# Patient Record
Sex: Male | Born: 1940 | Race: White | Hispanic: No | Marital: Married | Smoking: Never smoker
Health system: Southern US, Community
[De-identification: ages and names within clinical notes are randomized; demographics above are authoritative.]

## PROBLEM LIST (undated history)

## (undated) DIAGNOSIS — C4491 Basal cell carcinoma of skin, unspecified: Secondary | ICD-10-CM

## (undated) DIAGNOSIS — K8020 Calculus of gallbladder without cholecystitis without obstruction: Secondary | ICD-10-CM

## (undated) DIAGNOSIS — E785 Hyperlipidemia, unspecified: Secondary | ICD-10-CM

## (undated) DIAGNOSIS — I10 Essential (primary) hypertension: Secondary | ICD-10-CM

## (undated) DIAGNOSIS — K219 Gastro-esophageal reflux disease without esophagitis: Secondary | ICD-10-CM

## (undated) DIAGNOSIS — N5201 Erectile dysfunction due to arterial insufficiency: Secondary | ICD-10-CM

## (undated) DIAGNOSIS — C61 Malignant neoplasm of prostate: Secondary | ICD-10-CM

## (undated) HISTORY — DX: Erectile dysfunction due to arterial insufficiency: N52.01

## (undated) HISTORY — DX: Basal cell carcinoma of skin, unspecified: C44.91

## (undated) HISTORY — DX: Malignant neoplasm of prostate: C61

## (undated) HISTORY — DX: Calculus of gallbladder without cholecystitis without obstruction: K80.20

## (undated) HISTORY — DX: Hyperlipidemia, unspecified: E78.5

## (undated) HISTORY — DX: Gastro-esophageal reflux disease without esophagitis: K21.9

## (undated) HISTORY — PX: POLYPECTOMY: SHX149

## (undated) HISTORY — DX: Essential (primary) hypertension: I10

---
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
## 2001-08-05 ENCOUNTER — Encounter: Payer: Self-pay | Admitting: Internal Medicine

## 2001-08-05 ENCOUNTER — Ambulatory Visit (HOSPITAL_COMMUNITY): Admission: RE | Admit: 2001-08-05 | Discharge: 2001-08-05 | Payer: Self-pay | Admitting: Internal Medicine

## 2001-11-04 ENCOUNTER — Encounter: Payer: Self-pay | Admitting: Internal Medicine

## 2001-11-04 ENCOUNTER — Ambulatory Visit (HOSPITAL_COMMUNITY): Admission: RE | Admit: 2001-11-04 | Discharge: 2001-11-04 | Payer: Self-pay | Admitting: Orthopedic Surgery

## 2004-09-12 ENCOUNTER — Ambulatory Visit: Payer: Self-pay | Admitting: Internal Medicine

## 2004-10-17 ENCOUNTER — Ambulatory Visit: Payer: Self-pay | Admitting: Internal Medicine

## 2004-11-01 ENCOUNTER — Ambulatory Visit: Payer: Self-pay | Admitting: Internal Medicine

## 2005-04-23 ENCOUNTER — Ambulatory Visit: Payer: Self-pay | Admitting: Endocrinology

## 2005-09-02 ENCOUNTER — Ambulatory Visit: Admission: RE | Admit: 2005-09-02 | Discharge: 2005-12-01 | Payer: Self-pay | Admitting: Radiation Oncology

## 2005-09-03 ENCOUNTER — Ambulatory Visit: Payer: Self-pay | Admitting: Internal Medicine

## 2005-12-02 ENCOUNTER — Ambulatory Visit: Admission: RE | Admit: 2005-12-02 | Discharge: 2006-02-10 | Payer: Self-pay | Admitting: Radiation Oncology

## 2006-04-20 ENCOUNTER — Ambulatory Visit: Payer: Self-pay | Admitting: Internal Medicine

## 2006-08-07 ENCOUNTER — Ambulatory Visit: Payer: Self-pay | Admitting: Internal Medicine

## 2007-05-07 ENCOUNTER — Ambulatory Visit: Payer: Self-pay | Admitting: Internal Medicine

## 2007-05-09 ENCOUNTER — Encounter: Payer: Self-pay | Admitting: Internal Medicine

## 2007-05-09 DIAGNOSIS — I10 Essential (primary) hypertension: Secondary | ICD-10-CM

## 2007-05-09 DIAGNOSIS — Z8546 Personal history of malignant neoplasm of prostate: Secondary | ICD-10-CM

## 2007-05-12 LAB — CONVERTED CEMR LAB
Creatinine, Ser: 1.3 mg/dL (ref 0.4–1.5)
Total CHOL/HDL Ratio: 6.4
Triglycerides: 118 mg/dL (ref 0–149)

## 2007-12-27 ENCOUNTER — Ambulatory Visit: Payer: Self-pay | Admitting: Gastroenterology

## 2008-01-10 ENCOUNTER — Encounter: Payer: Self-pay | Admitting: Internal Medicine

## 2008-01-10 ENCOUNTER — Encounter: Payer: Self-pay | Admitting: Gastroenterology

## 2008-01-10 ENCOUNTER — Ambulatory Visit: Payer: Self-pay | Admitting: Gastroenterology

## 2008-08-23 ENCOUNTER — Ambulatory Visit: Payer: Self-pay | Admitting: Internal Medicine

## 2008-10-12 ENCOUNTER — Ambulatory Visit: Payer: Self-pay | Admitting: Internal Medicine

## 2008-10-14 DIAGNOSIS — C4491 Basal cell carcinoma of skin, unspecified: Secondary | ICD-10-CM

## 2008-10-14 DIAGNOSIS — E785 Hyperlipidemia, unspecified: Secondary | ICD-10-CM

## 2008-10-14 DIAGNOSIS — Z9189 Other specified personal risk factors, not elsewhere classified: Secondary | ICD-10-CM

## 2008-10-26 ENCOUNTER — Ambulatory Visit: Payer: Self-pay | Admitting: Internal Medicine

## 2008-10-26 LAB — CONVERTED CEMR LAB
AST: 28 units/L (ref 0–37)
Calcium: 8.9 mg/dL (ref 8.4–10.5)
Cholesterol: 146 mg/dL (ref 0–200)
Creatinine, Ser: 1.3 mg/dL (ref 0.4–1.5)
GFR calc non Af Amer: 58 mL/min
HDL: 41.9 mg/dL (ref >39.0)
Potassium: 3.8 meq/L (ref 3.5–5.1)
Total CHOL/HDL Ratio: 3.5
Total Protein: 6.4 g/dL (ref 6.0–8.3)
VLDL: 12 mg/dL (ref 0–40)

## 2008-10-29 ENCOUNTER — Encounter: Payer: Self-pay | Admitting: Internal Medicine

## 2008-11-06 ENCOUNTER — Telehealth: Payer: Self-pay | Admitting: Internal Medicine

## 2009-01-02 ENCOUNTER — Telehealth (INDEPENDENT_AMBULATORY_CARE_PROVIDER_SITE_OTHER): Payer: Self-pay | Admitting: *Deleted

## 2009-01-08 ENCOUNTER — Ambulatory Visit: Payer: Self-pay | Admitting: Internal Medicine

## 2009-01-08 LAB — CONVERTED CEMR LAB
BUN: 18 mg/dL (ref 6–23)
Chloride: 105 meq/L (ref 96–112)
Ketones, ur: NEGATIVE mg/dL
Nitrite: NEGATIVE
Sodium: 142 meq/L (ref 135–145)
Total Protein, Urine: NEGATIVE mg/dL
Urine Glucose: NEGATIVE mg/dL
Urobilinogen, UA: 0.2 (ref 0.0–1.0)
pH: 6 (ref 5.0–8.0)

## 2009-01-15 ENCOUNTER — Encounter: Payer: Self-pay | Admitting: Internal Medicine

## 2009-01-19 ENCOUNTER — Telehealth: Payer: Self-pay | Admitting: Internal Medicine

## 2009-11-04 ENCOUNTER — Encounter: Payer: Self-pay | Admitting: Internal Medicine

## 2009-11-05 ENCOUNTER — Telehealth (INDEPENDENT_AMBULATORY_CARE_PROVIDER_SITE_OTHER): Payer: Self-pay | Admitting: *Deleted

## 2009-11-06 ENCOUNTER — Ambulatory Visit: Payer: Self-pay | Admitting: Internal Medicine

## 2009-11-08 ENCOUNTER — Telehealth: Payer: Self-pay | Admitting: Internal Medicine

## 2009-11-09 ENCOUNTER — Observation Stay (HOSPITAL_COMMUNITY): Admission: EM | Admit: 2009-11-09 | Discharge: 2009-11-10 | Payer: Self-pay | Admitting: Emergency Medicine

## 2009-11-09 ENCOUNTER — Ambulatory Visit: Payer: Self-pay | Admitting: Internal Medicine

## 2009-11-10 ENCOUNTER — Encounter (INDEPENDENT_AMBULATORY_CARE_PROVIDER_SITE_OTHER): Payer: Self-pay | Admitting: *Deleted

## 2009-11-13 ENCOUNTER — Telehealth (INDEPENDENT_AMBULATORY_CARE_PROVIDER_SITE_OTHER): Payer: Self-pay | Admitting: *Deleted

## 2009-11-14 ENCOUNTER — Ambulatory Visit: Payer: Self-pay

## 2009-11-14 ENCOUNTER — Ambulatory Visit: Payer: Self-pay | Admitting: Cardiovascular Disease

## 2009-11-14 ENCOUNTER — Encounter (HOSPITAL_COMMUNITY): Admission: RE | Admit: 2009-11-14 | Discharge: 2010-01-09 | Payer: Self-pay | Admitting: Cardiology

## 2009-11-14 ENCOUNTER — Ambulatory Visit: Payer: Self-pay | Admitting: Cardiology

## 2009-11-14 DIAGNOSIS — K8020 Calculus of gallbladder without cholecystitis without obstruction: Secondary | ICD-10-CM | POA: Insufficient documentation

## 2009-11-19 ENCOUNTER — Encounter (INDEPENDENT_AMBULATORY_CARE_PROVIDER_SITE_OTHER): Payer: Self-pay | Admitting: *Deleted

## 2009-11-27 ENCOUNTER — Ambulatory Visit (HOSPITAL_COMMUNITY): Admission: RE | Admit: 2009-11-27 | Discharge: 2009-11-27 | Payer: Self-pay | Admitting: Internal Medicine

## 2009-11-27 ENCOUNTER — Telehealth: Payer: Self-pay | Admitting: Internal Medicine

## 2009-11-27 ENCOUNTER — Encounter: Payer: Self-pay | Admitting: Internal Medicine

## 2009-11-28 ENCOUNTER — Ambulatory Visit: Payer: Self-pay | Admitting: Internal Medicine

## 2009-11-28 DIAGNOSIS — M722 Plantar fascial fibromatosis: Secondary | ICD-10-CM | POA: Insufficient documentation

## 2009-11-28 DIAGNOSIS — M79609 Pain in unspecified limb: Secondary | ICD-10-CM | POA: Insufficient documentation

## 2009-11-30 ENCOUNTER — Ambulatory Visit: Payer: Self-pay | Admitting: Gastroenterology

## 2009-11-30 ENCOUNTER — Encounter (INDEPENDENT_AMBULATORY_CARE_PROVIDER_SITE_OTHER): Payer: Self-pay | Admitting: *Deleted

## 2009-11-30 DIAGNOSIS — Z860100 Personal history of colon polyps, unspecified: Secondary | ICD-10-CM | POA: Insufficient documentation

## 2009-11-30 DIAGNOSIS — R1033 Periumbilical pain: Secondary | ICD-10-CM | POA: Insufficient documentation

## 2009-11-30 DIAGNOSIS — K5730 Diverticulosis of large intestine without perforation or abscess without bleeding: Secondary | ICD-10-CM | POA: Insufficient documentation

## 2009-11-30 LAB — CONVERTED CEMR LAB
ALT: 19 units/L (ref 0–53)
AST: 19 units/L (ref 0–37)
Alkaline Phosphatase: 65 units/L (ref 39–117)
Bilirubin, Direct: 0.2 mg/dL (ref 0.0–0.3)
Total Protein: 6.4 g/dL (ref 6.0–8.3)

## 2009-12-03 ENCOUNTER — Ambulatory Visit: Payer: Self-pay | Admitting: Gastroenterology

## 2009-12-13 ENCOUNTER — Telehealth: Payer: Self-pay | Admitting: Gastroenterology

## 2010-07-01 ENCOUNTER — Ambulatory Visit: Payer: Self-pay | Admitting: Internal Medicine

## 2010-08-19 ENCOUNTER — Telehealth: Payer: Self-pay | Admitting: Internal Medicine

## 2010-11-05 NOTE — Progress Notes (Signed)
  Phone Note Call from Patient Call back at [facility] Phone [phone]   Caller: Patient Call For: Dr Debby Bud Summary of Call: Pt is requesting referral to a GI M.D. Pt wants referral and appt today. I told I would pass the info along but it normally takes a little bit of time to get in w/a GI M.D. Pt saw Dr Debby Bud earlier this week for abd pain. Pt is still having the pain and wants a referral immediately. Please advise. Initial call taken by: Verdell Face,  November 08, 2009 9:36 AM  Follow-up for Phone Call        OK for referral - will notify PCCs.   While waiting for appointment if he is having on-going acute pain of a moderate to severe nature we can order a CT abdomen and pelvis to rule out mass lesions, abscess, GB disease, etc. Follow-up by: Jacques Navy MD,  November 08, 2009 12:58 PM  Additional Follow-up for Phone Call Additional follow up Details #1::        informed pt, he did not seem like he was in a hurry to see GI. I did advise him if he expirianced any ongoing acute pain of moderate to severe nature to call the office and MD could order ct to rule out mass lesions, abscess, GB disease etc. Pt stated he would call if needed Additional Follow-up by: Ami Bullins CMA,  November 08, 2009 1:31 PM

## 2010-11-05 NOTE — Assessment & Plan Note (Signed)
Summary: per phone note in EMR/low abd pain/cd   Vital Signs:  Patient profile:   70 year old male Height:      72 inches Weight:      196 pounds BMI:     26.68 O2 Sat:      97 % on Room air Temp:     97.7 degrees F oral Pulse rate:   75 / minute BP sitting:   188 / 90  (left arm) Cuff size:   regular  Vitals Entered By: Bill Salinas CMA (November 06, 2009 1:58 PM)  O2 Flow:  Room air CC: pt c/o lower abd discomfort. He states sunday it was more pain than discomfort but subsided. Pt has had normal bowel movements but did mention one occurance of bright red blood in his stool about 2 weeks ago. This came after lifting an 80 pound tv/ ab   Primary Care Provider:  Jacques Navy MD  CC:  pt c/o lower abd discomfort. He states sunday it was more pain than discomfort but subsided. Pt has had normal bowel movements but did mention one occurance of bright red blood in his stool about 2 weeks ago. This came after lifting an 80 pound tv/ ab.  History of Present Illness: Abdominal discomfort starting about 2 weeks ago, after doing some heavy lifting. Saturday, Jan 29th, he had increased located below the umbilicus diffuse from side- to side, no protruberance, no tenderness to self-palpation, no fever or chills, no N/V. Had tried taking an aspirin - no noticeable help. Sunday and thereafter he felt better.   Current Medications (verified): 1)  Ranitidine Hcl 150 Mg  Caps (Ranitidine Hcl) .... Take 1 Tab By Mouth At Bedtime As Needed 2)  Lisinopril 20 Mg Tabs (Lisinopril) .... 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  Physical Exam  General:  Well-developed,well-nourished,in no acute distress; alert,appropriate and cooperative throughout examination Abdomen:  soft, non-tender, normal bowel sounds, no distention, no masses, no guarding, no rigidity, no abdominal hernia, and no hepatomegaly.     Impression & Recommendations:  Problem # 1:  ABDOMINAL PAIN, ACUTE (ICD-789.00) Patient with several weeks of low grade discomfort across the upper abdomen with an acute exacerbation Saturday, Jan 29th. His symptoms have resolved. His exam is normal. Suspect possible viral gastroenteritis. No indication for further study.  Complete Medication List: 1)  Ranitidine Hcl 150 Mg Caps (Ranitidine hcl) .... Take 1 tab by mouth at bedtime as needed 2)  Lisinopril 20 Mg Tabs (Lisinopril) .... Take 1 tablet by mouth once a day 3)  Hydrochlorothiazide 25 Mg Tabs (Hydrochlorothiazide) .Marland Kitchen.. 1 by mouth once daily Prescriptions: LISINOPRIL 20 MG TABS (LISINOPRIL) Take 1 tablet by mouth once a day  #30 x 12   Entered and Authorized by:   Jacques Navy MD   Signed by:   Jacques Navy MD on 11/06/2009   Method used:   Electronically to        [facility]  [place] (retail)       [address]       [place], [place]  [address]       Ph: [phone]       Fax: [phone]   RxID:   [id number] HYDROCHLOROTHIAZIDE 25 MG TABS (HYDROCHLOROTHIAZIDE) 1 by mouth once daily  #30 x 12   Entered and Authorized by:   Jacques Navy MD   Signed by:   Jacques Navy MD on 11/06/2009   Method used:   [facility] to        [facility] [phone][facility] (retail)       [address]       [place], [place]  [address]       Ph: [phone]        Fax: [phone]   RxID:   [id number]

## 2010-11-05 NOTE — Miscellaneous (Signed)
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

## 2010-11-05 NOTE — Progress Notes (Signed)
Summary: Nuclear Pre-Procedure  Phone Note Call from Patient   Details for Reason: Patient called for instructions for myoview stress test Summary of Call: Reviewed information on Myoview Information Sheet (see scanned document for further details).  Spoke with patient.     Nuclear Med Background Indications for Stress Test: Evaluation for Ischemia, Post Hospital  Indications Comments: Discharged 11/09/09 CP R/O MI (-) enzymes  History: GXT  History Comments: '04 GXT (-) ischemia  Symptoms: Chest Pain    Nuclear Pre-Procedure Cardiac Risk Factors: Hypertension, Lipids, Smoker Height (in): 72  Nuclear Med Study Referring MD:  B.Crenshaw

## 2010-11-05 NOTE — Progress Notes (Signed)
Summary: CALL A NURSE  Phone Note From Other Clinic   Summary of Call: CALL A NURSE: 11/04/09 10:58 am Pt c/o low abd pain which kept pt up last night. Noted blood in stool a few wks ago. No fever n/v, or appetite loss. No urinary symptoms. Pt was advised to f/u with PCP the next day. Care advice given.  Initial call taken by: Lamar Sprinkles, CMA,  November 05, 2009 6:10 PM  Follow-up for Phone Call        looks like an add-on to the schedule Follow-up by: Jacques Navy MD,  November 06, 2009 9:07 AM  Additional Follow-up for Phone Call Additional follow up Details #1::        Pt coming 2/1 @ 2pm for appt w/Dr Norins. Additional Follow-up by: Verdell Face,  November 06, 2009 9:22 AM

## 2010-11-05 NOTE — Progress Notes (Signed)
  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 Vanda from [facility], needs an order ct orbit with out contrast. Fax number is [phone]. Please Advise Initial call taken by: Ami Bullins CMA,  November 27, 2009 10:38 AM  Follow-up for Phone Call        written order faxed to [phone] Follow-up by: Ami Bullins CMA,  November 27, 2009 11:00 AM

## 2010-11-05 NOTE — Progress Notes (Signed)
Summary: Call Report  Phone Note Other Incoming   Caller: [facility] Summary of Call: [facility] Triage Call Report Triage Record Num: [id number] Operator: Peri Jefferson Patient Name: Brent Coleman Call Date & Time: 08/17/2010 3:01:00PM Patient Phone: [phone] PCP: Illene Regulus Patient Gender: Male PCP Fax : [phone] Patient DOB: 19-Mar-1941 Practice Name: [facility] Reason for Call: Maurine Minister calling because he started coughing on 08/10/10. States that it has worsened. Feeling tired. Temp 99.3 O. Cough is nonproductive. Denies difficulty breathing. Advised UC within 24 hrs. Parameters reviewed concerning when to call back. Protocol(s) Used: Cough - Adult Recommended Outcome per Protocol: See Provider within 24 hours Reason for Outcome: New onset or worsening cough AND temperature up to 101.4F (38.6C) AND not responding to 12 hours of home care Care Advice: Call EMS 911 if develop new onset or increasing confusion or lethargy; breathing problems continue to worsen or skin becomes bluish/gray; develops chest pain.  ~  ~ SYMPTOM / CONDITION MANAGEMENT Analgesic/Antipyretic Advice - Acetaminophen: Consider acetaminophen as directed on label or by pharmacist/provider for pain or fever PRECAUTIONS: - Use if there is no history of liver disease, alcoholism, or intake of three or more alcohol drinks per day - Only if approved by provider during pregnancy or when breastfeeding - During pregnancy, acetaminophen should not be taken more than 3 consecutive days without telling provider - Do not exceed recommended dose or frequency  ~ Analgesic/Antipyretic Advice - NSAIDs: Consider aspirin, ibuprofen, naproxen or ketoprofen for pain or fever as directed on label or by pharmacist/provider. PRECAUTIONS: - If over 58 years of age, should not take longer than 1 week without consulting provider. EXCEPTIONS: - Should not be used if taking blood thinners or have bleeding problems. -  Do not use if have history of sensitivity/allergy to any of these medications; or history of cardiovascular, ulcer, kidney, liver disease or diabet Initial call taken by: Margaret Pyle, CMA,  August 19, 2010 8:10 AM

## 2010-11-05 NOTE — Assessment & Plan Note (Signed)
Summary: flu shot//jrc  Flu Vaccine Consent Questions     Do you have a history of severe allergic reactions to this vaccine? no    Any prior history of allergic reactions to egg and/or gelatin? no    Do you have a sensitivity to the preservative Thimersol? no    Do you have a past history of Guillan-Barre Syndrome? no    Do you currently have an acute febrile illness? no    Have you ever had a severe reaction to latex? no    Vaccine information given and explained to patient? yes    Are you currently pregnant? no    Lot Number:[id number]   Exp Date:04/05/2011   Site Given  Left Deltoid IM Carron Curie CMA  July 01, 2010 10:11 AM  Nurse Visit   Allergies: 1)  Cardura  Orders Added: 1)  Flu Vaccine 38yrs + MEDICARE PATIENTS [Q2039] 2)  Administration Flu vaccine - MCR [G0008]

## 2010-11-05 NOTE — Procedures (Signed)
Summary: COlon   Colonoscopy  Procedure date:  01/10/2008  Findings:      Location:  [facility].   Patient Name: Brent Coleman, Brent Coleman MRN:  Procedure Procedures: Colonoscopy CPT: 81191.  Personnel: Endoscopist: Vania Rea. Jarold Motto, MD.  Exam Location: Exam performed in Outpatient Clinic. Outpatient  Patient Consent: Procedure, Alternatives, Risks and Benefits discussed, consent obtained, from patient. Consent was obtained by the RN.  Indications  Surveillance of: Adenomatous Polyp(s).  Increased Risk Screening: For family history of colorectal neoplasia, in  parent age at onset: 28.  Comments: Both parents with hx. of COLON CA. History  Current Medications: Patient is not currently taking Coumadin.  Medical/ Surgical History: Prostate CA, Hypertension,  Pre-Exam Physical: Performed Jan 10, 2008. Cardio-pulmonary exam, Rectal exam, Abdominal exam, Extremity exam, Mental status exam WNL.  Comments: Pt. history reviewed/updated, physical exam performed prior to initiation of sedation? yes Exam Exam: Extent of exam reached: Cecum, extent intended: Cecum.  The cecum was identified by appendiceal orifice and IC valve. Patient position: on left side. Time to Cecum: 00:03:40. Time for Withdrawl: 00:08:10. Colon retroflexion performed. Images taken. ASA Classification: II. Tolerance: excellent.  Monitoring: Pulse and BP monitoring, Oximetry used. Supplemental O2 given. at 2 Liters.  Colon Prep Used Golytely for colon prep. Prep results: excellent.  Sedation Meds: Patient assessed and found to be appropriate for moderate (conscious) sedation. Sedation was managed by the Endoscopist. Fentanyl 100 mcg. given IV. Versed 12 given IV.  Instrument(s): CF 140L. Serial [id number].  Findings - NORMAL EXAM: Cecum to Sigmoid Colon. Not Seen: Polyps. AVM's. Colitis. Tumors. Crohn's. Diverticulosis.  - MULTIPLE POLYPS: Sigmoid Colon to Rectum. minimum size 2  mm, maximum size 3 mm. Procedure:  hot biopsy, 2 polyps Polyps sent to pathology. ICD9: Colon Polyps: 211.3.   Assessment  Diagnoses: 211.3: Colon Polyps. R/O adenomas.   Comments: He is at high risk with hx.of colon CA in BOTH PARENTS !!!!!!hIS HX. ALSO ++ FOR PROSTATE CANCER.... Events  Unplanned Interventions: No intervention was required.  Plans Medication Plan: Await pathology. Referring provider to order medications.  Patient Education: Patient given standard instructions for: Polyps. Patient instructed to get routine colonoscopy every 3 years.  Disposition: After procedure patient sent to recovery. After recovery patient sent home.  Scheduling/Referral: Follow-Up prn. Await pathology to schedule patient.    Jefferey Pica, MD   This report was created from the original endoscopy report, which was reviewed and signed by the above listed endoscopist.

## 2010-11-05 NOTE — Assessment & Plan Note (Signed)
Summary: INJURY TO FOOT /NWS   Vital Signs:  Patient profile:   70 year old male Height:      72 inches (182.88 cm) Weight:      189.12 pounds (85.96 kg) O2 Sat:      96 % on Room air Temp:     97.7 degrees F (36.50 degrees C) oral Pulse rate:   71 / minute BP sitting:   124 / 62  (left arm) Cuff size:   large  Vitals Entered By: Orlan Leavens (November 28, 2009 10:59 AM)  O2 Flow:  Room air CC: Injury to (R) foot Is Patient Diabetic? No   Primary Care Provider:  Jacques Navy MD  CC:  Injury to (R) foot.  History of Present Illness: here today with complaint of pain in right foot. onset of symptoms was 5 days ago. course has been gradual onset and now occurs in intermittent waxing/waning pattern. problem precipitated by right foot "slipping" on bike pedal as began pedaling from a stopped position - symptom characterized as pain in the arch area symptom radiates to nowhere -  problem associated with difficulty walking, esp in AM  but not associated with bruising or erythema . symptoms improved by taking weight off foot (sitting) - also helped with ASA 81 x1 this AM. (this AM pain 10/10 preventing pt from walking, now 2/10 and ok to walk) symptoms worsened with activity, esp this AM. no prior hx of same symptoms.   Current Medications (verified): 1)  Ranitidine Hcl 150 Mg  Caps (Ranitidine Hcl) .... Take 1 Tab By Mouth At Bedtime As Needed 2)  Lisinopril 20 Mg Tabs (Lisinopril) .... Take 1 Tablet By Mouth Once A Day 3)  Hydrochlorothiazide 25 Mg Tabs (Hydrochlorothiazide) .Marland Kitchen.. 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 Children: two daughters  one in [place].  Two grandchildren living in [place]. Occupation: Patient is retired and enjoying his retirement.  He remains active and continues to bicycle  Review of Systems  The patient denies fever, syncope, muscle weakness, and suspicious skin lesions.    Physical Exam  General:  alert, well-developed, well-nourished, and cooperative to examination.    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 1 by mouth once daily as needed for pain  Orders: T-Foot Right (73630TC) Prescription Created Electronically [phone])  Discussed use of gel inserts, ice massage, and stretching exercises.  pt education including handout from [facility] provided to pt  Complete Medication List: 1)  Ranitidine Hcl 150 Mg Caps (Ranitidine hcl) .Marland KitchenMarland KitchenMarland Kitchen  Take 1 tab by mouth at bedtime as needed 2)  Lisinopril 20 Mg Tabs (Lisinopril) .... Take 1 tablet by mouth once a day 3)  Hydrochlorothiazide 25 Mg Tabs (Hydrochlorothiazide) .Marland Kitchen.. 1 by mouth once daily 4)  Mobic 7.5 Mg Tabs (Meloxicam) .Marland Kitchen.. 1 by mouth once daily as needed for pain  Patient Instructions: 1)  it was good to see you today.  2)  your symptoms are most consistent with plantar fascitis as discussed - education and information on strecthes provided -  3)  mobic for antiinflammatory pain relief to  use as needed  -your prescription has been electronically submitted to your pharmacy. Please take as directed. Contact our office if you believe you're having problems with the medication(s).  4)  use shoe insert to help supprot arch until pain improved - 5)  may also do "ice massage" to the painful area 2-4x/day, espicially if any swelling occurs 6)  xray of foot today to exclude any stress fractures - will call you with results later today 7)  Please keep follow-up appointment as scheduled with dr. Debby Bud, sooner if problems.  Prescriptions: MOBIC 7.5 MG TABS (MELOXICAM) 1 by mouth once daily as needed for pain  #30 x 1   Entered and Authorized by:   Newt Lukes MD   Signed by:   Newt Lukes MD on 11/28/2009   Method used:   [facility]ronically to        [facility] [phone][facility] (retail)       [address]       [place], [place]  [address]       Ph: [phone]       Fax: [phone]   RxID:   [id number]

## 2010-11-05 NOTE — Letter (Signed)
Summary: [facility]: Immunoassay Fecal Occult Blood (iFOB) Order [facility]  [address] [place], [place] [address]   Phone: [phone]  Fax: [phone]      [facility]: Immunoassay Fecal Occult Blood (iFOB) Order Form   November 30, 2009 MRN: [id number]   ENDER RORKE [birth year]   Physicican Name: Jarold Motto  Diagnosis Code: 789.05     Ashok Cordia RN

## 2010-11-05 NOTE — Letter (Signed)
Summary: Call A Nurse  Call A Nurse   Imported By: Sherian Rein 11/07/2009 09:52:42  _____________________________________________________________________  External Attachment:    Type:   Image     Comment:   External Document

## 2010-11-05 NOTE — Assessment & Plan Note (Signed)
Summary: abd pain...em   History of Present Illness Visit Type: consult Primary GI MD: Sheryn Bison MD FACP FAGA Primary Provider: Illene Regulus, MD Chief Complaint: 2 episodes of lower adbominal pain that has resolved, injured back a couple of weeks ago lifting a heavy TV and thought abdominal pain was related History of Present Illness:   70 year old Caucasian male with chronic diverticulosis, very strong family history of colon cancer in both parents, and recurrent colon polyps. He norinsis referred today by Dr. Illene Regulus for several weeks of lower abdominal pain which is now resolved. He has some lower abdominal gas and bloating and vague discomfort without change in bowel habits, melena, hematochezia, upper gastrointestinal or hepatobiliary complaints. Extensive workup has shown calcified gallstones on CT scan and a small hepatic hemangioma seen on MRI of the liver. He is currently asymptomatic. He denies anorexia, weight loss, fever or chills. He also denies any history of hepatitis, pancreatitis, or abuse of alcohol, cigarettes, or NSAIDs.   GI Review of Systems    Reports abdominal pain, acid reflux, chest pain, heartburn, and  weight loss.     Location of  Abdominal pain: lower abdomen. Weight loss of ? pounds over 3 months.   Denies belching, bloating, dysphagia with liquids, dysphagia with solids, loss of appetite, nausea, vomiting, vomiting blood, and  weight gain.        Denies anal fissure, black tarry stools, change in bowel habit, constipation, diarrhea, diverticulosis, fecal incontinence, heme positive stool, hemorrhoids, irritable bowel syndrome, jaundice, light color stool, liver problems, rectal bleeding, and  rectal pain.    Current Medications (verified): 1)  Ranitidine Hcl 150 Mg  Caps (Ranitidine Hcl) .... Take 1 Tab By Mouth At Bedtime As Needed 2)  Lisinopril 20 Mg Tabs (Lisinopril) .... Take 1 Tablet By Mouth Once A Day 3)  Hydrochlorothiazide 25 Mg Tabs  (Hydrochlorothiazide) .Marland Kitchen.. 1 By Mouth Once Daily 4)  Mobic 7.5 Mg Tabs (Meloxicam) .Marland Kitchen.. 1 By Mouth Once Daily As Needed For Pain  Allergies (verified): 1)  Cardura  Past History:  Past medical, surgical, family and social histories (including risk factors) reviewed for relevance to current acute and chronic problems.  Past Medical History: HYPERLIPIDEMIA (ICD-272.4) CHEST PAIN, ATYPICAL, HX OF (ICD-V15.89) Hx of CARCINOMA, BASAL CELL (ICD-173.9) NEOPLASM, MALIGNANT, PROSTATE, S/P RADIATION THERAPY (ICD-185) HYPERTENSION (ICD-401.9) Gallstones  Past Surgical History: Reviewed history from 05/09/2007 and no changes required. Tonsillectomy  Family History: Reviewed history from 05/09/2007 and no changes required. Family History of Colon CA 1st degree relative <60-Mother, Father Family History Hypertension Family History of Stroke F 1st degree relative <60  Social History: Reviewed history from 11/28/2009 and no changes required. Marital Status: Married in his marriage is in good health. Children: two daughters one in [place].  Two grandchildren living in [place]. Occupation: Patient is retired and enjoying his retirement.  He remains active and continues to bicycle Patient has never smoked.  Alcohol Use - no Daily Caffeine Use 2/day Illicit Drug Use - no Patient gets regular exercise.  Review of Systems       The patient complains of cough.  The patient denies allergy/sinus, anemia, anxiety-new, arthritis/joint pain, back pain, blood in urine, breast changes/lumps, confusion, coughing up blood, depression-new, fainting, fatigue, fever, headaches-new, hearing problems, heart murmur, heart rhythm changes, itching, muscle pains/cramps, night sweats, nosebleeds, shortness of breath, skin rash, sleeping problems, sore throat, swelling of feet/legs, swollen lymph glands, thirst - excessive, urination - excessive, urination changes/pain, urine leakage, vision changes, and voice  change.    Vital Signs:  Patient profile:   70 year old male Height:      72 inches Weight:      192 pounds BMI:     26.13 Pulse rate:   84 / minute Pulse rhythm:   regular BP sitting:   140 / 78  (left arm) Cuff size:   regular  Vitals Entered By: Francee Piccolo CMA Duncan Dull) (November 30, 2009 9:09 AM)  Physical Exam  General:  Well developed, well nourished, no acute distress.healthy appearing.   Head:  Normocephalic and atraumatic. Eyes:  PERRLA, no icterus. Lungs:  Clear throughout to auscultation. Heart:  Regular rate and rhythm; no murmurs, rubs,  or bruits. Abdomen:  Soft, nontender and nondistended. No masses, hepatosplenomegaly or hernias noted. Normal bowel sounds. Extremities:  No clubbing, cyanosis, edema or deformities noted. Neurologic:  Alert and  oriented x4;  grossly normal neurologically. Psych:  Alert and cooperative. Normal mood and affect.   Impression & Recommendations:  Problem # 1:  DIVERTICULOSIS OF COLON (ICD-562.10) Assessment Improved Probable low grade subacute diverticulitis which has resolved. He has noted upper gastrointestinal or hepatobiliary symptoms. I think it is gallstones are asymptomatic and will observe his clinical course, I would not recommend cholecystectomy at this time. Repeat labs have been ordered. Orders: TLB-Hepatic/Liver Function Pnl (80076-HEPATIC) TLB-Amylase (82150-AMYL) TLB-Lipase (83690-LIPASE)  Problem # 2:  ABDOMINAL PAIN, PERIUMBILIC (ICD-789.05) Assessment: Improved  Orders: TLB-Hepatic/Liver Function Pnl (80076-HEPATIC) TLB-Amylase (82150-AMYL) TLB-Lipase (83690-LIPASE)  Problem # 3:  CHOLELITHIASIS (ICD-574.20) Assessment: Comment Only  Orders: TLB-Hepatic/Liver Function Pnl (80076-HEPATIC) TLB-Amylase (82150-AMYL) TLB-Lipase (83690-LIPASE)  Problem # 4:  LIVER MASS (ICD-235.3) Assessment: Unchanged Small uncomplicated hemangioma and does not need further evaluation at this time.  Problem # 5:   PERSONAL HX COLONIC POLYPS (ICD-V12.72) Assessment: Unchanged Colonoscopy due next year. Immunological testing for fecal occult blood has been ordered.  Problem # 6:  PERSONAL HX PROSTATE CANCER (ICD-V10.46) Assessment: Comment Only  Patient Instructions: 1)  Copy sent to : Dr. Illene Regulus 2)  Diet should be high in fiber ( fruits, vegetables, whole grains) but low in residue. Drink at least eight (8) glasses of water a day.  3)  Please continue current medications.  4)  Stool exam for human hemoglobin 5)  May need trial of probiotics therapy. 6)  Liver profile ordered 7)  The medication list was reviewed and reconciled.  All changed / newly prescribed medications were explained.  A complete medication list was provided to the patient / caregiver.  Appended Document: abd pain...em    Clinical Lists Changes  Medications: Added new medication of ALIGN   CAPS (MISC INTESTINAL FLORA REGULAT) Take one capsule by mouth daily prn

## 2010-11-05 NOTE — Miscellaneous (Signed)
Summary: Orders Update   Clinical Lists Changes  Orders: Added new Referral order of Cardiolite (Cardiolite) - Signed Added new Referral order of Radiology Referral (Radiology) - Signed

## 2010-11-05 NOTE — Assessment & Plan Note (Signed)
Summary: Cardiology Nuclear Study  Nuclear Med Background Indications for Stress Test: Evaluation for Ischemia, Post Hospital  Indications Comments: Discharged 11/09/09 CP R/O MI (-) enzymes  History: GXT  History Comments: '04 GXT (-) ischemia  Symptoms: Chest Pain    Nuclear Pre-Procedure Cardiac Risk Factors: Hypertension, Lipids, Smoker Caffeine/Decaff Intake: None NPO After: 6:00 AM Lungs: clear IV 0.9% NS with Angio Cath: 20g     IV Site: (L) AC (patent) IV Started by: Josetta Huddle, CT Chest Size (in) 42     Height (in): 72 Weight (lb): 191 BMI: 26.00 Tech Comments: CT scan done prior to [facility].  Nuclear Med Study 1 or 2 day study:  1 day     Stress Test Type:  Stress Reading MD:  Charlton Haws, MD     Referring MD:  B.Crenshaw Resting Radionuclide:  Technetium 57m Tetrofosmin     Resting Radionuclide Dose:  11.0 mCi  Stress Radionuclide:  Technetium 59m Tetrofosmin     Stress Radionuclide Dose:  33.0 mCi   Stress Protocol Exercise Time (min):  10:00 min     Max HR:  150 bpm     Predicted Max HR:  150 bpm  Max Systolic BP: 186 mm Hg     Percent Max HR:  100 %     METS: 11.7 Rate Pressure Product:  23762    Stress Test Technologist:  Milana Na EMT-P     Nuclear Technologist:  Burna Mortimer Deal RT-N  Rest Procedure  Myocardial perfusion imaging was performed at rest 45 minutes following the intravenous administration of Myoview Technetium 50m Tetrofosmin.  Stress Procedure  The patient exercised for 10:00. The patient stopped due to fatigue and denied any chest pain.  There were non specific ST-T wave changes and rare pvcs.  Myoview was injected at peak exercise and myocardial perfusion imaging was performed after a brief delay.  QPS Raw Data Images:  Normal; no motion artifact; normal heart/lung ratio. Stress Images:  NI: Uniform and normal uptake of tracer in all myocardial segments. Rest Images:  Normal homogeneous uptake in all areas of the  myocardium. Subtraction (SDS):  Normal Transient Ischemic Dilatation:  .90  (Normal <1.22)  Lung/Heart Ratio:  .27  (Normal <0.45)  Quantitative Gated Spect Images QGS EDV:  82 ml QGS ESV:  27 ml QGS EF:  67 % QGS cine images:  Normal  Findings Normal nuclear study      Overall Impression  Exercise Capacity: Good exercise capacity. BP Response: Normal blood pressure response. Clinical Symptoms: No chest pain ECG Impression: No significant ST segment change suggestive of ischemia. Overall Impression: Normal stress nuclear study. Overall Impression Comments: Normal  Appended Document: Cardiology Nuclear Study not my patient; forward to ordering physician

## 2010-11-05 NOTE — Progress Notes (Signed)
Summary: hem card   Phone Note Call from Patient Call back at Home Phone [phone]   Caller: Patient Call For: Dr. Jarold Motto Reason for Call: Lab or Test Results Summary of Call: would like results from hem card test Initial call taken by: Vallarie Mare,  December 13, 2009 10:26 AM  Follow-up for Phone Call        Lm for pt to call.  Lupita Leash Surface RN  December 13, 2009 12:22 PM   Pt notified of results. Follow-up by: Ashok Cordia RN,  December 13, 2009 3:41 PM

## 2010-12-26 LAB — CARDIAC PANEL(CRET KIN+CKTOT+MB+TROPI)
CK, MB: 3 ng/mL (ref 0.3–4.0)
Relative Index: 2.4 (ref 0.0–2.5)
Relative Index: 2.7 — ABNORMAL HIGH (ref 0.0–2.5)
Total CK: 123 U/L (ref 7–232)
Troponin I: 0.03 ng/mL (ref 0.00–0.06)

## 2010-12-26 LAB — URINALYSIS, ROUTINE W REFLEX MICROSCOPIC
Ketones, ur: NEGATIVE mg/dL
Protein, ur: NEGATIVE mg/dL
Urobilinogen, UA: 0.2 mg/dL (ref 0.0–1.0)

## 2010-12-26 LAB — POCT CARDIAC MARKERS: Myoglobin, poc: 124 ng/mL (ref 12–200)

## 2010-12-26 LAB — LIPID PANEL
Cholesterol: 180 mg/dL (ref 0–200)
HDL: 30 mg/dL — ABNORMAL LOW (ref >39)
LDL Cholesterol: 120 mg/dL — ABNORMAL HIGH (ref 0–99)
Triglycerides: 150 mg/dL — ABNORMAL HIGH (ref <150)

## 2010-12-26 LAB — BASIC METABOLIC PANEL
CO2: 26 mEq/L (ref 19–32)
Chloride: 107 mEq/L (ref 96–112)
GFR calc Af Amer: >60 mL/min (ref >60)
Potassium: 3.7 mEq/L (ref 3.5–5.1)

## 2010-12-26 LAB — DIFFERENTIAL
Basophils Absolute: 0 10*3/uL (ref 0.0–0.1)
Eosinophils Absolute: 0.1 10*3/uL (ref 0.0–0.7)
Lymphocytes Relative: 27 % (ref 12–46)
Lymphs Abs: 1.3 10*3/uL (ref 0.7–4.0)
Neutrophils Relative %: 63 % (ref 43–77)

## 2010-12-26 LAB — POCT I-STAT, CHEM 8
Creatinine, Ser: 1 mg/dL (ref 0.4–1.5)
Hemoglobin: 16 g/dL (ref 13.0–17.0)
Potassium: 3.4 mEq/L — ABNORMAL LOW (ref 3.5–5.1)
Sodium: 139 mEq/L (ref 135–145)
TCO2: 28 mmol/L (ref 0–100)

## 2010-12-26 LAB — CBC
MCV: 98.7 fL (ref 78.0–100.0)
Platelets: 121 10*3/uL — ABNORMAL LOW (ref 150–400)
Platelets: 144 10*3/uL — ABNORMAL LOW (ref 150–400)
RDW: 12.5 % (ref 11.5–15.5)
WBC: 4.6 10*3/uL (ref 4.0–10.5)
WBC: 5 10*3/uL (ref 4.0–10.5)

## 2011-02-21 NOTE — Assessment & Plan Note (Signed)
[facility]                                 ON-CALL NOTE   AISON, MALVEAUX                        MRN:          [id number]  DATE:06/20/2007                            DOB:          [birth year]    Phone number [phone].   The patient is under treatment for hypertension by Dr. Debby Bud, and his  lisinopril was raised from 10 mg to 20 about 3 weeks ago because of  systolic blood pressures in the 150s. He was doing generally well but  today had about a 60-second time where he just did not feel right in his  head, but there were no associated numbness, diplopia, dysarthria,  weakness or imbalance. His head was in a very funny position, leaning,  and his wife thought it might have been related to that. Yesterday, he  was biked a good deal in the hot weather, and his blood pressure after  biking was 106 range. Today, after he felt bad, his blood pressure was  179/86. He has not rechecked it again and currently otherwise feels  well. I have recommended that he can recheck his blood pressure this  evening and sit and relax for 5 minutes. If it is again persistently  high over 170, he can take another 10 mg of lisinopril.  He should  monitor his numbers and talk with Dr. Debby Bud next week. However, he  gives another episode of something, he should call or seek care.     Neta Mends. Panosh, MD  Electronically Signed    WKP/MedQ  DD: 06/20/2007  DT: 06/21/2007  Job #: [id number]

## 2011-02-21 NOTE — Assessment & Plan Note (Signed)
[facility]                                 ON-CALL NOTE   ERVIE, MCCARD                    MRN:          [id number]  DATE:11/04/2008                            DOB:          [birth year]    TIME:  At 12:26 p.m.   PHONE NUMBER:  [phone].   REGULAR DOCTOR:  Rosalyn Gess. Norins, MD and Idamae Schuller A. Tower, MD on call.   CHIEF COMPLAINT:  Blood pressure problem.   The patient states that he is to be on lisinopril, but was changed to  Caduet 3 weeks ago because he had high level of cholesterol.  Caduet is  working well for his cholesterol, but his blood pressure has been  steadily climbing, today it is 180/83.  He feels physically fine.  He  denies chest pain, shortness of breath, headache, weakness, numbness, or  any other symptoms.  If he wants to know what he should do over the  weekend before he can contact Dr. Debby Bud, I advised him to hold the  Caduet and go back to his lisinopril today and tomorrow and to call Dr.  Debby Bud' office on Monday for further instructions because he wants me to  titrate his medication.  I also advised him to call me back if his blood  pressure does not go down or call me or seek care if he develops any  other symptoms.     Marne A. Tower, MD  Electronically Signed    MAT/MedQ  DD: 11/04/2008  DT: 11/05/2008  Job #: [id number]

## 2011-02-27 ENCOUNTER — Encounter: Payer: Self-pay | Admitting: Gastroenterology

## 2011-03-15 ENCOUNTER — Other Ambulatory Visit: Payer: Self-pay | Admitting: Internal Medicine

## 2011-03-15 NOTE — Telephone Encounter (Signed)
NEEDS OV 

## 2011-04-04 ENCOUNTER — Encounter: Payer: Self-pay | Admitting: Gastroenterology

## 2011-04-21 ENCOUNTER — Encounter: Payer: Self-pay | Admitting: Internal Medicine

## 2011-04-30 ENCOUNTER — Ambulatory Visit (AMBULATORY_SURGERY_CENTER): Payer: Medicare Other | Admitting: *Deleted

## 2011-04-30 VITALS — Ht 73.0 in | Wt 200.6 lb

## 2011-04-30 DIAGNOSIS — Z86010 Personal history of colonic polyps: Secondary | ICD-10-CM

## 2011-04-30 DIAGNOSIS — Z800 Family history of malignant neoplasm of digestive organs: Secondary | ICD-10-CM

## 2011-04-30 MED ORDER — MOVIPREP 100 G PO SOLR: ORAL | Status: DC

## 2011-05-01 ENCOUNTER — Other Ambulatory Visit (INDEPENDENT_AMBULATORY_CARE_PROVIDER_SITE_OTHER): Payer: Medicare Other

## 2011-05-01 ENCOUNTER — Other Ambulatory Visit: Payer: Self-pay | Admitting: Internal Medicine

## 2011-05-01 ENCOUNTER — Ambulatory Visit (INDEPENDENT_AMBULATORY_CARE_PROVIDER_SITE_OTHER): Payer: Medicare Other | Admitting: Internal Medicine

## 2011-05-01 DIAGNOSIS — E785 Hyperlipidemia, unspecified: Secondary | ICD-10-CM

## 2011-05-01 DIAGNOSIS — I10 Essential (primary) hypertension: Secondary | ICD-10-CM

## 2011-05-01 DIAGNOSIS — C61 Malignant neoplasm of prostate: Secondary | ICD-10-CM

## 2011-05-01 DIAGNOSIS — Z0000 Encounter for general adult medical examination without abnormal findings: Secondary | ICD-10-CM

## 2011-05-01 DIAGNOSIS — Z23 Encounter for immunization: Secondary | ICD-10-CM

## 2011-05-01 LAB — COMPREHENSIVE METABOLIC PANEL
BUN: 23 mg/dL (ref 6–23)
CO2: 25 mEq/L (ref 19–32)
GFR: 67.03 mL/min (ref >60.00)
Glucose, Bld: 103 mg/dL — ABNORMAL HIGH (ref 70–99)
Sodium: 135 mEq/L (ref 135–145)
Total Bilirubin: 1.2 mg/dL (ref 0.3–1.2)
Total Protein: 7.8 g/dL (ref 6.0–8.3)

## 2011-05-01 LAB — LIPID PANEL
Cholesterol: 244 mg/dL — ABNORMAL HIGH (ref 0–200)
VLDL: 48.8 mg/dL — ABNORMAL HIGH (ref 0.0–40.0)

## 2011-05-01 MED ORDER — PNEUMOCOCCAL VAC POLYVALENT 25 MCG/0.5ML IJ INJ: 0.5000 mL | INJECTION | Freq: Once | INTRAMUSCULAR | Status: DC

## 2011-05-01 NOTE — Progress Notes (Signed)
Subjective:    Patient ID: Brent Coleman, male    DOB: 16-Nov-1940, 70 y.o.   MRN: [id number]  HPI Mr. Brosnahan presents for general follow-up. He has been doing well with no specific medical complaints or problems. Last year he had a bout of abdominal pain and discomfort.  He also had an episode of chest pain and had a negative nuclear stress test. He had an MRI abdomen revealing a hemangioma. He had seen Dr. Jarold Motto with no active problems.  Past Medical History  Diagnosis Date  . Other and unspecified hyperlipidemia   . Other specified personal history presenting hazards to health   . Basal cell carcinoma   . Malignant neoplasm of prostate   . Unspecified essential hypertension   . Gallstone    Past Surgical History  Procedure Date  . Tonsillectomy   . Colonoscopy   . Polypectomy    Family History  Problem Relation Age of Onset  . Cancer Mother     colon  . Colon cancer Mother   . Cancer Father     colon  . Colon cancer Father   . Hypertension Other   . Stroke Other    History   Social History  . Marital Status: Married    Spouse Name: N/A    Number of Children: N/A  . Years of Education: N/A   Occupational History  . retired    Social History Main Topics  . Smoking status: Never Smoker   . Smokeless tobacco: Never Used  . Alcohol Use: 0.5 oz/week    1 drink(s) per week  . Drug Use: No  . Sexually Active: Not on file   Other Topics Concern  . Not on file   Social History Narrative   Married/ marriage is in good health2 [place]/ active and continues to bicycleDaily Caffeine use 2/day       Review of Systems Review of Systems  Constitutional:  Negative for fever, chills, activity change and unexpected weight change.  HEENT:  Negative for hearing loss, ear pain, congestion, neck stiffness and postnasal drip. Negative for sore throat or swallowing problems. Negative for dental complaints.   Eyes: Negative for vision loss  or change in visual acuity.  Respiratory: Negative for chest tightness and wheezing.   Cardiovascular: Negative for chest pain and palpitation. No decreased exercise tolerance Gastrointestinal: No change in bowel habit. No bloating or gas. No reflux or indigestion Genitourinary: Negative for urgency, frequency, flank pain and difficulty urinating.  Musculoskeletal: Negative for myalgias, back pain, arthralgias and gait problem.  Neurological: Negative for dizziness, tremors, weakness and headaches.  Hematological: Negative for adenopathy.  Psychiatric/Behavioral: Negative for behavioral problems and dysphoric mood.       Objective:   Physical Exam Vital signs reviewed-BP borderline elevated Gen'l: Well nourished well developed whitemale in no acute distress  HENT:  Head: Normocephalic and atraumatic.  Right Ear: External ear normal. EAC/TM nl Left Ear: External ear normal.  EAC/TM nl Nose: Nose normal.  Mouth/Throat: Oropharynx is clear and moist. Dentition - native, in good repair. No buccal or palatal lesions. Posterior pharynx clear. Eyes: Conjunctivae and sclera clear. EOM intact. Pupils are equal, round, and reactive to light. Right eye exhibits no discharge. Left eye exhibits no discharge. Neck: Normal range of motion. Neck supple. No JVD present. No tracheal deviation present. No thyromegaly present.  Cardiovascular: Normal rate, regular rhythm, no gallop, no friction rub, no murmur heard.      Quiet precordium.  2+ radial and DP pulses . No carotid bruits Pulmonary/Chest: Effort normal. No respiratory distress or increased WOB, no wheezes, no rales. No chest wall deformity or CVAT. Abdominal: Soft. Bowel sounds are normal in all quadrants. He exhibits no distension, no tenderness, no rebound or guarding, No heptosplenomegaly  Genitourinary:  deferred Musculoskeletal: Normal range of motion. He exhibits no edema and no tenderness.       Small and large joints without redness,  synovial thickening or deformity. Full range of motion preserved about all small, median and large joints.  Lymphadenopathy:    He has no cervical or supraclavicular adenopathy.  Neurological: He is alert and oriented to person, place, and time. CN II-XII intact. DTRs 2+ and symmetrical biceps, radial and patellar tendons. Cerebellar function normal with no tremor, rigidity, normal gait and station.  Skin: Skin is warm and dry. No rash noted. No erythema.  Psychiatric: He has a normal mood and affect. His behavior is normal. Thought content normal.   Lab Results  Component Value Date   WBC 5.0 11/10/2009   HGB 14.6 11/10/2009   HCT 41.1 11/10/2009   PLT 121* 11/10/2009   CHOL 244* 05/01/2011   TRIG 244.0* 05/01/2011   HDL 46.40 05/01/2011   LDLDIRECT 139.9 05/01/2011   ALT 25 05/01/2011   AST 24 05/01/2011   NA 135 05/01/2011   K 4.2 05/01/2011   CL 99 05/01/2011   CREATININE 1.1 05/01/2011   BUN 23 05/01/2011   CO2 25 05/01/2011           Assessment & Plan:

## 2011-05-02 DIAGNOSIS — Z0000 Encounter for general adult medical examination without abnormal findings: Secondary | ICD-10-CM | POA: Insufficient documentation

## 2011-05-02 NOTE — Assessment & Plan Note (Addendum)
He reports a good year with no new medical problems but he did have an episode of foot pain that resolved. He was seen at urgent care and was diagnosed with a "touch" of pneumonia - reporting that there was a "sppot" on his lung. Physical exam in unremarkable. Lab results do reveal an elevated LDL cholesterol. He is current with colorectal cancer screening with last study April '09. Immunizations: Tdap Jan '08; Shingles vaccine Nov '09; Pneumonia vaccine today.  In summary - a very nice man who does appear medically stable at this time. He will return as needed or in one year.

## 2011-05-02 NOTE — Assessment & Plan Note (Signed)
BP Readings from Last 3 Encounters:  05/01/11 148/90  11/30/09 140/78  11/28/09 124/62   Home record with the majority of readings below 140.  Plan - continue present medical regimen. If home readings trend upward to a consistent SBP >140 pattern will need to adjust medication

## 2011-05-02 NOTE — Assessment & Plan Note (Signed)
Subopitmal control with LDL above goal of 130 or less. He is on no medication. The LDL is below the NCEP ATP III treatment threshold of 160.  Plan - dietary management - low fat diet- and continued exercise.             Repeat lab in 6-12 months

## 2011-05-02 NOTE — Assessment & Plan Note (Signed)
Follows with Urology and reports last PSA was good.

## 2011-05-03 NOTE — Progress Notes (Signed)
Mailed/SD

## 2011-05-14 ENCOUNTER — Ambulatory Visit (AMBULATORY_SURGERY_CENTER): Payer: Medicare Other | Admitting: Gastroenterology

## 2011-05-14 ENCOUNTER — Encounter: Payer: Self-pay | Admitting: Gastroenterology

## 2011-05-14 DIAGNOSIS — Z800 Family history of malignant neoplasm of digestive organs: Secondary | ICD-10-CM

## 2011-05-14 DIAGNOSIS — Z1211 Encounter for screening for malignant neoplasm of colon: Secondary | ICD-10-CM

## 2011-05-14 DIAGNOSIS — Z86010 Personal history of colonic polyps: Secondary | ICD-10-CM

## 2011-05-14 DIAGNOSIS — D126 Benign neoplasm of colon, unspecified: Secondary | ICD-10-CM

## 2011-05-14 DIAGNOSIS — K5730 Diverticulosis of large intestine without perforation or abscess without bleeding: Secondary | ICD-10-CM

## 2011-05-14 MED ORDER — SODIUM CHLORIDE 0.9 % IV SOLN: 500.0000 mL | INTRAVENOUS | Status: DC

## 2011-05-14 NOTE — Patient Instructions (Signed)
Discharge instructions given with verbal understanding. Handouts on polyp. Diverticulosis and hemorrhoids given. Resume previous medications.

## 2011-05-15 ENCOUNTER — Telehealth: Payer: Self-pay | Admitting: *Deleted

## 2011-05-15 NOTE — Telephone Encounter (Signed)

## 2011-06-30 ENCOUNTER — Other Ambulatory Visit: Payer: Self-pay | Admitting: Internal Medicine

## 2011-07-09 ENCOUNTER — Ambulatory Visit (INDEPENDENT_AMBULATORY_CARE_PROVIDER_SITE_OTHER): Payer: Medicare Other | Admitting: *Deleted

## 2011-07-09 DIAGNOSIS — Z23 Encounter for immunization: Secondary | ICD-10-CM

## 2011-11-05 ENCOUNTER — Other Ambulatory Visit: Payer: Self-pay | Admitting: Internal Medicine

## 2011-11-05 NOTE — Telephone Encounter (Signed)
Done

## 2012-01-08 ENCOUNTER — Other Ambulatory Visit (INDEPENDENT_AMBULATORY_CARE_PROVIDER_SITE_OTHER): Payer: Medicare Other

## 2012-01-08 ENCOUNTER — Encounter: Payer: Self-pay | Admitting: Internal Medicine

## 2012-01-08 ENCOUNTER — Ambulatory Visit (INDEPENDENT_AMBULATORY_CARE_PROVIDER_SITE_OTHER): Payer: Medicare Other | Admitting: Internal Medicine

## 2012-01-08 VITALS — BP 142/78 | HR 77 | Temp 98.3°F | Resp 16 | Ht 73.5 in | Wt 198.5 lb

## 2012-01-08 DIAGNOSIS — E785 Hyperlipidemia, unspecified: Secondary | ICD-10-CM | POA: Diagnosis not present

## 2012-01-08 DIAGNOSIS — I10 Essential (primary) hypertension: Secondary | ICD-10-CM | POA: Diagnosis not present

## 2012-01-08 DIAGNOSIS — Z0000 Encounter for general adult medical examination without abnormal findings: Secondary | ICD-10-CM | POA: Diagnosis not present

## 2012-01-08 DIAGNOSIS — C61 Malignant neoplasm of prostate: Secondary | ICD-10-CM

## 2012-01-08 DIAGNOSIS — Z9189 Other specified personal risk factors, not elsewhere classified: Secondary | ICD-10-CM

## 2012-01-08 LAB — COMPREHENSIVE METABOLIC PANEL
ALT: 33 U/L (ref 0–53)
Albumin: 4.3 g/dL (ref 3.5–5.2)
Alkaline Phosphatase: 87 U/L (ref 39–117)
CO2: 27 mEq/L (ref 19–32)
Potassium: 4 mEq/L (ref 3.5–5.1)
Sodium: 139 mEq/L (ref 135–145)
Total Bilirubin: 1.2 mg/dL (ref 0.3–1.2)
Total Protein: 7.2 g/dL (ref 6.0–8.3)

## 2012-01-08 LAB — LIPID PANEL
HDL: 42 mg/dL (ref >39.00)
Total CHOL/HDL Ratio: 5
VLDL: 28.8 mg/dL (ref 0.0–40.0)

## 2012-01-08 LAB — CBC WITH DIFFERENTIAL/PLATELET
Basophils Absolute: 0 10*3/uL (ref 0.0–0.1)
Eosinophils Absolute: 0 10*3/uL (ref 0.0–0.7)
HCT: 47.8 % (ref 39.0–52.0)
Lymphs Abs: 0.7 10*3/uL (ref 0.7–4.0)
MCV: 97.6 fl (ref 78.0–100.0)
Monocytes Absolute: 0.7 10*3/uL (ref 0.1–1.0)
Neutrophils Relative %: 74.6 % (ref 43.0–77.0)
Platelets: 136 10*3/uL — ABNORMAL LOW (ref 150.0–400.0)
RDW: 12.6 % (ref 11.5–14.6)

## 2012-01-08 MED ORDER — FUROSEMIDE 20 MG PO TABS: 20.0000 mg | ORAL_TABLET | Freq: Every day | ORAL | Status: DC

## 2012-01-08 NOTE — Progress Notes (Signed)
Subjective:    Patient ID: Brent Coleman, male    DOB: June 15, 1941, 71 y.o.   MRN: [id number]  HPI The patient is here for annual Medicare wellness examination and management of other chronic and acute problems. Doing well since his last visit except for a mild URI for the past 48 hrs    The risk factors are reflected in the social history.  The roster of all physicians providing medical care to patient - is listed in the Snapshot section of the chart.  Activities of daily living:  The patient is 100% inedpendent in all ADLs: dressing, toileting, feeding as well as independent mobility  Home safety : The patient has smoke detectors in the home. Falls-no falls. Low fall risk environment. NO grab bars in the bathroom. They wear seatbelts. No firearms at home. There is no violence in the home.   There is no risks for hepatitis, STDs or HIV. There is no   history of blood transfusion. They have no travel history to infectious disease endemic areas of the world.  The patient has seen their dentist in the last six month. They have seen their eye doctor in the last year. They deny any hearing difficulty and have not had audiologic testing in the last year.  They do not  have excessive sun exposure. Discussed the need for sun protection: hats, long sleeves and use of sunscreen if there is significant sun exposure.   Diet: the importance of a healthy diet is discussed. They do have a unhealthy-high fat/fast food diet.  The patient has a regular exercise program: walk , 30 minutes duration,  5 per week.  The benefits of regular aerobic exercise were discussed.  Depression screen: there are no signs or vegative symptoms of depression- irritability, change in appetite, anhedonia, sadness/tearfullness.  Cognitive assessment: the patient manages all their financial and personal affairs and is actively engaged.   The following portions of the patient's history were reviewed and updated as appropriate:  allergies, current medications, past family history, past medical history,  past surgical history, past social history  and problem list.  Vision, hearing, body mass index were assessed and reviewed.   During the course of the visit the patient was educated and counseled about appropriate screening and preventive services including : fall prevention , diabetes screening, nutrition counseling, colorectal cancer screening, and recommended immunizations.  Past Medical History  Diagnosis Date  . Other and unspecified hyperlipidemia   . Other specified personal history presenting hazards to health   . Basal cell carcinoma   . Malignant neoplasm of prostate   . Unspecified essential hypertension   . Gallstone    Past Surgical History  Procedure Date  . Tonsillectomy   . Colonoscopy   . Polypectomy    Family History  Problem Relation Age of Onset  . Cancer Mother     colon  . Colon cancer Mother   . Cancer Father     colon  . Colon cancer Father   . Hypertension Other   . Stroke Other    History   Social History  . Marital Status: Married    Spouse Name: N/A    Number of Children: N/A  . Years of Education: N/A   Occupational History  . retired    Social History Main Topics  . Smoking status: Never Smoker   . Smokeless tobacco: Never Used  . Alcohol Use: 0.5 oz/week    1 drink(s) per week  . Drug Use:  No  . Sexually Active: Yes -- Male partner(s)   Other Topics Concern  . Not on file   Social History Narrative   HSG. College grad. Married/ marriage is in good health. 2 Daughters. 2 grandchildren living in [place]. Work - retired from [employer] @ 14 and has enjoyed his retirement. Exercise/ active and continues to bicycle. ACP - not discussed.   Current Outpatient Prescriptions on File Prior to Visit  Medication Sig Dispense Refill  . chlorpheniramine (CHLOR-TRIMETON) 2 MG/5ML syrup Take 2 mg by mouth every 4 (four) hours as needed.      . hydrochlorothiazide 25 MG  tablet Take 25 mg by mouth daily.       . meloxicam (MOBIC) 7.5 MG tablet Take 7.5 mg by mouth daily as needed.        . Probiotic Product (ALIGN) 4 MG CAPS Take by mouth.        . furosemide (LASIX) 20 MG tablet Take 1 tablet (20 mg total) by mouth daily.  30 tablet  11  . lisinopril (PRINIVIL,ZESTRIL) 20 MG tablet TAKE 1 TABLET BY MOUTH ONCE A DAY  30 tablet  3  . Multiple Vitamins-Minerals (MULTIVITAMIN WITH MINERALS) tablet Take 1 tablet by mouth daily.        . ranitidine (ZANTAC) 150 MG capsule Take 150 mg by mouth every evening.         Current Facility-Administered Medications on File Prior to Visit  Medication Dose Route Frequency Provider Last Rate Last Dose  . pneumococcal 23 valent vaccine (PNU-IMMUNE) injection 0.5 mL  0.5 mL Intramuscular Once Jacques Navy, MD           Review of Systems Constitutional:  Negative for fever, chills, activity change and unexpected weight change.  HEENT:  Negative for hearing loss, ear pain, congestion, neck stiffness and postnasal drip. Negative for sore throat or swallowing problems. Negative for dental complaints.   Eyes: Negative for vision loss or change in visual acuity.  Respiratory: Negative for chest tightness and wheezing. Negative for DOE.   Cardiovascular: Negative for chest pain or palpitations. No decreased exercise tolerance Gastrointestinal: No change in bowel habit. No bloating or gas. No reflux or indigestion Genitourinary: Negative for urgency, frequency, flank pain and difficulty urinating.  Musculoskeletal: Negative for myalgias, back pain, arthralgias and gait problem.  Neurological: Negative for dizziness, tremors, weakness and headaches.  Hematological: Negative for adenopathy.  Psychiatric/Behavioral: Negative for behavioral problems and dysphoric mood.       Objective:   Physical Exam Filed Vitals:   01/08/12 1336  BP: 142/78  Pulse: 77  Temp: 98.3 F (36.8 C)  Resp: 16   Wt Readings from Last 3  Encounters:  01/08/12 198 lb 8 oz (90.039 kg)  05/14/11 195 lb (88.451 kg)  05/01/11 199 lb (90.266 kg)    Gen'l: Well nourished well developed white male in no acute distress  HEENT: Head: Normocephalic and atraumatic. Right Ear: External ear normal. EAC/TM nl. Left Ear: External ear normal.  EAC/TM nl. Nose: Nose normal. Mouth/Throat: Oropharynx is clear and moist. Dentition - native, in good repair. No buccal or palatal lesions. Posterior pharynx clear. Eyes: Conjunctivae and sclera clear. EOM intact. Pupils are equal, round, and reactive to light. Right eye exhibits no discharge. Left eye exhibits no discharge. Neck: Normal range of motion. Neck supple. No JVD present. No tracheal deviation present. No thyromegaly present.  Cardiovascular: Normal rate, regular rhythm, no gallop, no friction rub, no murmur heard.  Quiet precordium. 2+ radial and DP pulses . No carotid bruits Pulmonary/Chest: Effort normal. No respiratory distress or increased WOB, no wheezes, no rales. No chest wall deformity or CVAT. Abdominal: Soft. Bowel sounds are normal in all quadrants. He exhibits no distension, no tenderness, no rebound or guarding, No heptosplenomegaly  Genitourinary:  followed by urology on a regular basis Musculoskeletal: Normal range of motion. He exhibits no edema and no tenderness.       Small and large joints without redness, synovial thickening or deformity. Full range of motion preserved about all small, median and large joints.  Lymphadenopathy:    He has no cervical or supraclavicular adenopathy.  Neurological: He is alert and oriented to person, place, and time. CN II-XII intact. DTRs 2+ and symmetrical biceps, radial and patellar tendons. Cerebellar function normal with no tremor, rigidity, normal gait and station.  Skin: Skin is warm and dry. No rash noted. No erythema.  Psychiatric: He has a normal mood and affect. His behavior is normal. Thought content normal.   Lab Results    Component Value Date   WBC 5.8 01/08/2012   HGB 16.7 01/08/2012   HCT 47.8 01/08/2012   PLT 136.0* 01/08/2012   GLUCOSE 98 01/08/2012   CHOL 200 01/08/2012   TRIG 144.0 01/08/2012   HDL 42.00 01/08/2012   LDLDIRECT 139.9 05/01/2011   LDLCALC 129* 01/08/2012   ALT 33 01/08/2012   AST 34 01/08/2012   NA 139 01/08/2012   K 4.0 01/08/2012   CL 103 01/08/2012   CREATININE 1.3 01/08/2012   BUN 17 01/08/2012   CO2 27 01/08/2012         Assessment & Plan:

## 2012-01-11 ENCOUNTER — Encounter: Payer: Self-pay | Admitting: Internal Medicine

## 2012-01-11 NOTE — Assessment & Plan Note (Signed)
Interval medical history is stable. Physical exam is normal. Lab results are in normal range. He is current with colorectal cancer screening. He follows closely with urology re: prostate cancer. Immunizations are up to date.  In summary - a very nice man who is doing well and appears medically stable. He is traveling to [place] soon and has a trip planned to [place] - the [place] region. He is asked to return in 1 year, sooner if needed.

## 2012-01-11 NOTE — Assessment & Plan Note (Signed)
No c/o chest pain and he is active.

## 2012-01-11 NOTE — Assessment & Plan Note (Addendum)
BP Readings from Last 3 Encounters:  01/08/12 142/78  05/14/11 156/77  05/01/11 148/90   Mild elevation above goal of 130's/80's.  Plan - resume diuretic therapy - furosemide 20 mg daily.           Continue lisinopril           Monitor BP at home and report back if systolic BP is not consistently below 140 and closer to 130 or less.

## 2012-01-11 NOTE — Assessment & Plan Note (Signed)
Follow-up by urology and evidently doing well.

## 2012-01-11 NOTE — Assessment & Plan Note (Addendum)
On no medication. Low to moderate cardiac risk profile: mild hypertension; not diabetic; no obese; non-smoker; is physically active; negative family history. LDL is just below goal of 161 or less.  Plan - continued healthy life-style with prudent low fat diet and regular exercise.

## 2012-01-21 ENCOUNTER — Telehealth: Payer: Self-pay | Admitting: *Deleted

## 2012-01-21 NOTE — Telephone Encounter (Signed)
Patient called stating that he normally takes Sudafed [1] one week prior to flying when he has sinus congestion w/associated symptoms; pt will be flying out on Monday, 04.22.13 and has been using sudafed which has cleared his sinuses but his Left ear is still blocked & he is having trouble hearing. Patient request advice for home & OTC treatment/remedies to try to resolve this issue. Please advise.

## 2012-01-21 NOTE — Telephone Encounter (Signed)
Continue Sudafed (mx 240mg /24h) and add antihistamin such as claritin 10mg  daily - Ok to also use Afrin nasal spray every 12h x 3 days in addition to Sudafed - sleep with left ear up (if possible) to allow gravity to facilitate drainage - thanks

## 2012-01-22 NOTE — Telephone Encounter (Signed)
Patient informed. 

## 2012-03-06 ENCOUNTER — Other Ambulatory Visit: Payer: Self-pay | Admitting: Internal Medicine

## 2012-04-14 DIAGNOSIS — C61 Malignant neoplasm of prostate: Secondary | ICD-10-CM | POA: Diagnosis not present

## 2012-04-20 DIAGNOSIS — Z8546 Personal history of malignant neoplasm of prostate: Secondary | ICD-10-CM | POA: Diagnosis not present

## 2012-08-05 ENCOUNTER — Ambulatory Visit (INDEPENDENT_AMBULATORY_CARE_PROVIDER_SITE_OTHER): Payer: Medicare Other | Admitting: General Practice

## 2012-08-05 DIAGNOSIS — Z23 Encounter for immunization: Secondary | ICD-10-CM

## 2012-09-05 ENCOUNTER — Other Ambulatory Visit: Payer: Self-pay | Admitting: Internal Medicine

## 2012-09-06 ENCOUNTER — Other Ambulatory Visit: Payer: Self-pay | Admitting: *Deleted

## 2012-09-06 MED ORDER — LISINOPRIL 20 MG PO TABS: 20.0000 mg | ORAL_TABLET | Freq: Every day | ORAL | Status: DC

## 2012-10-08 ENCOUNTER — Other Ambulatory Visit: Payer: Self-pay | Admitting: Internal Medicine

## 2013-01-30 ENCOUNTER — Other Ambulatory Visit: Payer: Self-pay | Admitting: Internal Medicine

## 2013-03-01 ENCOUNTER — Other Ambulatory Visit: Payer: Self-pay | Admitting: Internal Medicine

## 2013-03-02 ENCOUNTER — Other Ambulatory Visit: Payer: Self-pay | Admitting: Internal Medicine

## 2013-03-14 ENCOUNTER — Other Ambulatory Visit: Payer: Self-pay | Admitting: Dermatology

## 2013-03-14 DIAGNOSIS — D235 Other benign neoplasm of skin of trunk: Secondary | ICD-10-CM | POA: Diagnosis not present

## 2013-03-14 DIAGNOSIS — C4441 Basal cell carcinoma of skin of scalp and neck: Secondary | ICD-10-CM | POA: Diagnosis not present

## 2013-03-14 DIAGNOSIS — D485 Neoplasm of uncertain behavior of skin: Secondary | ICD-10-CM | POA: Diagnosis not present

## 2013-03-14 DIAGNOSIS — C44519 Basal cell carcinoma of skin of other part of trunk: Secondary | ICD-10-CM | POA: Diagnosis not present

## 2013-03-14 DIAGNOSIS — L570 Actinic keratosis: Secondary | ICD-10-CM | POA: Diagnosis not present

## 2013-03-31 ENCOUNTER — Encounter: Payer: Self-pay | Admitting: Internal Medicine

## 2013-03-31 ENCOUNTER — Other Ambulatory Visit: Payer: Medicare Other

## 2013-03-31 ENCOUNTER — Ambulatory Visit (INDEPENDENT_AMBULATORY_CARE_PROVIDER_SITE_OTHER): Payer: Medicare Other | Admitting: Internal Medicine

## 2013-03-31 VITALS — BP 144/80 | HR 67 | Temp 97.2°F | Resp 12 | Ht 73.0 in | Wt 199.8 lb

## 2013-03-31 DIAGNOSIS — E785 Hyperlipidemia, unspecified: Secondary | ICD-10-CM

## 2013-03-31 DIAGNOSIS — C4490 Unspecified malignant neoplasm of skin, unspecified: Secondary | ICD-10-CM

## 2013-03-31 DIAGNOSIS — I10 Essential (primary) hypertension: Secondary | ICD-10-CM

## 2013-03-31 DIAGNOSIS — Z0000 Encounter for general adult medical examination without abnormal findings: Secondary | ICD-10-CM

## 2013-03-31 DIAGNOSIS — C61 Malignant neoplasm of prostate: Secondary | ICD-10-CM

## 2013-03-31 LAB — COMPREHENSIVE METABOLIC PANEL
ALT: 24 U/L (ref 0–53)
AST: 23 U/L (ref 0–37)
Calcium: 9 mg/dL (ref 8.4–10.5)
Chloride: 100 mEq/L (ref 96–112)
Creatinine, Ser: 1.6 mg/dL — ABNORMAL HIGH (ref 0.4–1.5)
Total Bilirubin: 0.6 mg/dL (ref 0.3–1.2)

## 2013-03-31 MED ORDER — LISINOPRIL 20 MG PO TABS: 20.0000 mg | ORAL_TABLET | Freq: Every day | ORAL | Status: DC

## 2013-03-31 MED ORDER — FUROSEMIDE 20 MG PO TABS: 20.0000 mg | ORAL_TABLET | Freq: Every day | ORAL | Status: DC

## 2013-03-31 NOTE — Progress Notes (Signed)
Subjective:    Patient ID: Brent Coleman, male    DOB: Jun 07, 1941, 72 y.o.   MRN: [id number]  HPI Brent Coleman is here for annual Medicare wellness examination and management of other chronic and acute problems.   Feeling well, no problems in the interval.  The risk factors are reflected in the social history.  The roster of all physicians providing medical care to patient - is listed in the Snapshot section of the chart.  Activities of daily living:  The patient is 100% inedpendent in all ADLs: dressing, toileting, feeding as well as independent mobility  Home safety : The patient has smoke detectors in the home. Falls - no, home fall safe.They wear seatbelts.  firearms are present in the home, kept in a safe fashion. There is no violence in the home.   There is no risks for hepatitis, STDs or HIV. There is no history of blood transfusion. They have no travel history to infectious disease endemic areas of the world.  The patient has seen their dentist in the last six month. They have seen their eye doctor in the last year. They deny any hearing difficulty and have not had audiologic testing in the last year.    They do not  have excessive sun exposure. Discussed the need for sun protection: hats, long sleeves and use of sunscreen if there is significant sun exposure.   Diet: the importance of a healthy diet is discussed. They do have a healthy diet.  The patient has a regular exercise program: bicycle/walk  ,  60-90 duration, 5 per week.  The benefits of regular aerobic exercise were discussed.  Depression screen: there are no signs or vegative symptoms of depression- irritability, change in appetite, anhedonia, sadness/tearfullness.  Cognitive assessment: the patient manages all their financial and personal affairs and is actively engaged.   The following portions of the patient's history were reviewed and updated as appropriate: allergies, current medications, past family history,  past medical history,  past surgical history, past social history  and problem list.  Vision, hearing, body mass index were assessed and reviewed.   During the course of the visit the patient was educated and counseled about appropriate screening and preventive services including : fall prevention , diabetes screening, nutrition counseling, colorectal cancer screening, and recommended immunizations.  Past Medical History  Diagnosis Date  . Other and unspecified hyperlipidemia   . Other specified personal history presenting hazards to health(V15.89)   . Basal cell carcinoma   . Malignant neoplasm of prostate   . Unspecified essential hypertension   . Gallstone    Past Surgical History  Procedure Laterality Date  . Tonsillectomy    . Colonoscopy    . Polypectomy     Family History  Problem Relation Age of Onset  . Cancer Mother     colon  . Colon cancer Mother   . Cancer Father     colon  . Colon cancer Father   . Hypertension Other   . Stroke Other    History   Social History  . Marital Status: Married    Spouse Name: N/A    Number of Children: N/A  . Years of Education: N/A   Occupational History  . retired    Social History Main Topics  . Smoking status: Never Smoker   . Smokeless tobacco: Never Used  . Alcohol Use: 0.5 oz/week    1 drink(s) per week  . Drug Use: No  . Sexually Active: Yes --  Male partner(s)   Other Topics Concern  . Not on file   Social History Narrative   HSG. College grad. Married/ marriage is in good health. 2 Daughters. 2 grandchildren living in [place]. Work - retired from [employer] @ 32 and has enjoyed his retirement. Exercise/ active and continues to bicycle. ACP - not discussed.          Current Outpatient Prescriptions on File Prior to Visit  Medication Sig Dispense Refill  . furosemide (LASIX) 20 MG tablet TAKE 1 TABLET (20 MG TOTAL) BY MOUTH DAILY.  30 tablet  0  . lisinopril (PRINIVIL,ZESTRIL) 20 MG tablet Take 1 tablet (20  mg total) by mouth daily.  30 tablet  0  . Multiple Vitamins-Minerals (MULTIVITAMIN WITH MINERALS) tablet Take 1 tablet by mouth daily.        . ranitidine (ZANTAC) 150 MG capsule Take 150 mg by mouth every evening.         Current Facility-Administered Medications on File Prior to Visit  Medication Dose Route Frequency Provider Last Rate Last Dose  . pneumococcal 23 valent vaccine (PNU-IMMUNE) injection 0.5 mL  0.5 mL Intramuscular Once Jacques Navy, MD           Review of Systems Constitutional:  Negative for fever, chills, activity change and unexpected weight change.  HEENT:  Negative for hearing loss, ear pain, congestion, neck stiffness and postnasal drip. Negative for sore throat or swallowing problems. Negative for dental complaints.   Eyes: Negative for vision loss or change in visual acuity.  Respiratory: Negative for chest tightness and wheezing. Negative for DOE.   Cardiovascular: Negative for chest pain or palpitations. No decreased exercise tolerance Gastrointestinal: No change in bowel habit. No bloating or gas. No reflux or indigestion Genitourinary: Negative for urgency, frequency, flank pain and difficulty urinating.  Musculoskeletal: Negative for myalgias, back pain, arthralgias and gait problem.  Neurological: Negative for dizziness, tremors, weakness and headaches.  Hematological: Negative for adenopathy.  Psychiatric/Behavioral: Negative for behavioral problems and dysphoric mood.       Objective:   Physical Exam Filed Vitals:   03/31/13 1410  BP: 144/80  Pulse: 67  Temp: 97.2 F (36.2 C)  Resp: 12   Wt Readings from Last 3 Encounters:  03/31/13 199 lb 12.8 oz (90.629 kg)  01/08/12 198 lb 8 oz (90.039 kg)  05/14/11 195 lb (88.451 kg)   Gen'l: Well nourished well developed white male in no acute distress  HEENT: Head: Normocephalic and atraumatic. Right Ear: External ear normal. EAC/TM nl. Left Ear: External ear normal.  EAC/TM nl. Nose: Nose  normal. Mouth/Throat: Oropharynx is clear and moist. Dentition - native, in good repair. No buccal or palatal lesions. Posterior pharynx clear. Eyes: Conjunctivae and sclera clear. EOM intact. Pupils are equal, round, and reactive to light. Right eye exhibits no discharge. Left eye exhibits no discharge. Neck: Normal range of motion. Neck supple. No JVD present. No tracheal deviation present. No thyromegaly present.  Cardiovascular: Normal rate, regular rhythm, no gallop, no friction rub, no murmur heard.      Quiet precordium. 2+ radial and DP pulses . No carotid bruits Pulmonary/Chest: Effort normal. No respiratory distress or increased WOB, no wheezes, no rales. No chest wall deformity or CVAT. Abdomen: Soft. Bowel sounds are normal in all quadrants. He exhibits no distension, no tenderness, no rebound or guarding, No heptosplenomegaly  Genitourinary:  deferred to GU Musculoskeletal: Normal range of motion. He exhibits no edema and no tenderness.  Small and large joints without redness, synovial thickening or deformity. Full range of motion preserved about all small, median and large joints.  Lymphadenopathy:    He has no cervical or supraclavicular adenopathy.  Neurological: He is alert and oriented to person, place, and time. CN II-XII intact. DTRs 2+ and symmetrical biceps, radial and patellar tendons. Cerebellar function normal with no tremor, rigidity, normal gait and station.  Skin: Skin is warm and dry. No rash noted. No erythema.  Psychiatric: He has a normal mood and affect. His behavior is normal. Thought content normal.   Recent Results (from the past 2160 hour(s))  COMPREHENSIVE METABOLIC PANEL     Status: Abnormal   Collection Time    03/31/13  3:25 PM      Result Value Range   Sodium 136  135 - 145 mEq/L   Potassium 3.9  3.5 - 5.1 mEq/L   Chloride 100  96 - 112 mEq/L   CO2 28  19 - 32 mEq/L   Glucose, Bld 96  70 - 99 mg/dL   BUN 30 (*) 6 - 23 mg/dL   Creatinine, Ser  1.6 (*) 0.4 - 1.5 mg/dL   Total Bilirubin 0.6  0.3 - 1.2 mg/dL   Alkaline Phosphatase 61  39 - 117 U/L   AST 23  0 - 37 U/L   ALT 24  0 - 53 U/L   Total Protein 7.1  6.0 - 8.3 g/dL   Albumin 4.2  3.5 - 5.2 g/dL   Calcium 9.0  8.4 - 11.9 mg/dL   GFR 14.78 (*) >29.56 mL/min        Assessment & Plan:

## 2013-03-31 NOTE — Patient Instructions (Addendum)
Thanks for coming in. YOu look very healthy and medically stable.  Will be checking lab today in regard to BP but will not repeat lipid panel this year.  You are current with all health maintenance.  Please give consideration to Advanced Care Planning - see packet and check out [email or web address]  Full report to follow. Hope I don't see you until next year.

## 2013-04-03 NOTE — Assessment & Plan Note (Signed)
Interval history is unremarkable with no major illness, surgery or injury. Physical exam is normal. Limited lab is normal. He is current with colorectal cancer screening. He follows with Dr. Margarita Grizzle re: prostate cancer follow up. Immunizations are current and up to date.  In summary A very healthy appearing man, looking younger than his chronologic age and medically stable. He will return in 1 year, sooner as needed.

## 2013-04-03 NOTE — Assessment & Plan Note (Signed)
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 Follows with Dr. Margarita Grizzle - last visit in July '13 - PSA 0.4  Very stable.  Plan F/u with Dr. Margarita Grizzle July '14

## 2013-04-03 NOTE — Assessment & Plan Note (Signed)
No active lesions. Follows routinely with dermatology

## 2013-04-03 NOTE — Assessment & Plan Note (Signed)
BP Readings from Last 3 Encounters:  03/31/13 144/80  01/08/12 142/78  05/14/11 156/77   Within lastest JNC 8 guidelines for acceptable systolic blood pressure in a patient 50+ without significant cardiovascular risk.  Plan continue present medications.

## 2013-04-03 NOTE — Assessment & Plan Note (Signed)
Last lab April '13 with cholesterol of 200, HDL 42, LDL 129. Reveiwed labs to 2008 - always within NCEP guideline normals for LDL and HDL. Not an active problem.  Plan Continue healthy life-style with good diet and exercise.  Recommend repeat lipid panel in 2018

## 2013-04-21 DIAGNOSIS — Z8546 Personal history of malignant neoplasm of prostate: Secondary | ICD-10-CM | POA: Diagnosis not present

## 2013-04-28 DIAGNOSIS — N529 Male erectile dysfunction, unspecified: Secondary | ICD-10-CM | POA: Diagnosis not present

## 2013-04-28 DIAGNOSIS — Z8546 Personal history of malignant neoplasm of prostate: Secondary | ICD-10-CM | POA: Diagnosis not present

## 2013-07-07 ENCOUNTER — Ambulatory Visit (INDEPENDENT_AMBULATORY_CARE_PROVIDER_SITE_OTHER): Payer: Medicare Other

## 2013-07-07 DIAGNOSIS — Z23 Encounter for immunization: Secondary | ICD-10-CM | POA: Diagnosis not present

## 2013-08-01 DIAGNOSIS — Z85828 Personal history of other malignant neoplasm of skin: Secondary | ICD-10-CM | POA: Diagnosis not present

## 2013-08-01 DIAGNOSIS — L570 Actinic keratosis: Secondary | ICD-10-CM | POA: Diagnosis not present

## 2013-09-12 ENCOUNTER — Telehealth: Payer: Self-pay | Admitting: Internal Medicine

## 2013-09-12 ENCOUNTER — Ambulatory Visit (INDEPENDENT_AMBULATORY_CARE_PROVIDER_SITE_OTHER): Payer: Medicare Other | Admitting: Internal Medicine

## 2013-09-12 ENCOUNTER — Encounter: Payer: Self-pay | Admitting: Internal Medicine

## 2013-09-12 VITALS — BP 160/76 | HR 70 | Temp 98.1°F | Wt 198.0 lb

## 2013-09-12 DIAGNOSIS — R229 Localized swelling, mass and lump, unspecified: Secondary | ICD-10-CM

## 2013-09-12 DIAGNOSIS — R2241 Localized swelling, mass and lump, right lower limb: Secondary | ICD-10-CM

## 2013-09-12 NOTE — Telephone Encounter (Signed)
Ok to add on today - late PM

## 2013-09-12 NOTE — Patient Instructions (Signed)
Swelling right anterior thigh. On exam the area is about 6 cm in diameter and not hot, fluctuant or tender. Leading possibilities are benign fatty tumor vs avulsed quadriceps muscle vs other mechanical injury vs something worse (the worst would be a fatty sarcoma).  Plan  MRI of the right thigh - the definitive imaging technique for soft tissue problems.

## 2013-09-12 NOTE — Progress Notes (Signed)
Pre visit review using our clinic review tool, if applicable. No additional management support is needed unless otherwise documented below in the visit note. 

## 2013-09-12 NOTE — Telephone Encounter (Signed)
Pt has had a lump that he can cup in his hand come up on his leg this morning.  He would like to come in today or have a call.

## 2013-09-13 ENCOUNTER — Other Ambulatory Visit: Payer: Self-pay | Admitting: Internal Medicine

## 2013-09-13 ENCOUNTER — Other Ambulatory Visit: Payer: Medicare Other

## 2013-09-13 DIAGNOSIS — R2241 Localized swelling, mass and lump, right lower limb: Secondary | ICD-10-CM

## 2013-09-13 DIAGNOSIS — N182 Chronic kidney disease, stage 2 (mild): Secondary | ICD-10-CM

## 2013-09-13 LAB — COMPREHENSIVE METABOLIC PANEL
BUN: 27 mg/dL — ABNORMAL HIGH (ref 6–23)
CO2: 29 mEq/L (ref 19–32)
Calcium: 8.8 mg/dL (ref 8.4–10.5)
Chloride: 100 mEq/L (ref 96–112)
Creatinine, Ser: 1.6 mg/dL — ABNORMAL HIGH (ref 0.4–1.5)
GFR: 45.7 mL/min — ABNORMAL LOW (ref >60.00)

## 2013-09-14 NOTE — Progress Notes (Signed)
HPI Brent Coleman presents for a tennis ball size lump right anterior thigh. May have been developing over the past several weeks. No injury or strain. No fevers or chills, no pain.   Past Medical History  Diagnosis Date  . Other and unspecified hyperlipidemia   . Other specified personal history presenting hazards to health(V15.89)   . Basal cell carcinoma   . Malignant neoplasm of prostate   . Unspecified essential hypertension   . Gallstone    Past Surgical History  Procedure Laterality Date  . Tonsillectomy    . Colonoscopy    . Polypectomy     Family History  Problem Relation Age of Onset  . Cancer Mother     colon  . Colon cancer Mother   . Cancer Father     colon  . Colon cancer Father   . Hypertension Other   . Stroke Other    History   Social History  . Marital Status: Married    Spouse Name: N/A    Number of Children: N/A  . Years of Education: N/A   Occupational History  . retired    Social History Main Topics  . Smoking status: Never Smoker   . Smokeless tobacco: Never Used  . Alcohol Use: 0.5 oz/week    1 drink(s) per week  . Drug Use: No  . Sexual Activity: Yes    Partners: Female   Other Topics Concern  . Not on file   Social History Narrative   HSG. College grad. Married/ marriage is in good health. 2 Daughters. 2 grandchildren living in [place]. Work - retired from [employer] @ 51 and has enjoyed his retirement. Exercise/ active and continues to bicycle. ACP - discussed and provided living will packet and referred to http://www.adams.info/.             Current Outpatient Prescriptions on File Prior to Visit  Medication Sig Dispense Refill  . furosemide (LASIX) 20 MG tablet Take 1 tablet (20 mg total) by mouth daily.  90 tablet  3  . lisinopril (PRINIVIL,ZESTRIL) 20 MG tablet Take 1 tablet (20 mg total) by mouth daily.  90 tablet  3  . Multiple Vitamins-Minerals (MULTIVITAMIN WITH MINERALS) tablet Take 1 tablet by mouth daily.        .  ranitidine (ZANTAC) 150 MG capsule Take 150 mg by mouth every evening.         Current Facility-Administered Medications on File Prior to Visit  Medication Dose Route Frequency Provider Last Rate Last Dose  . pneumococcal 23 valent vaccine (PNU-IMMUNE) injection 0.5 mL  0.5 mL Intramuscular Once Jacques Navy, MD          Review of Systems System review is negative for any constitutional, cardiac, pulmonary, GI or neuro symptoms or complaints other than as described in the HPI.     Objective:   Physical Exam Filed Vitals:   09/12/13 1659  BP: 160/76  Pulse: 70  Temp: 98.1 F (36.7 C)   gen'l- WNWD man who looks younger than his stated age Ext - soft tissue mass approximately 6 cm diameter anterior right thigh. Soft, not tender, uniform texture, not mobile. Normal ROM right leg.        Assessment & Plan:  Soft tissue mass right leg - lipoma vs avulsed muscle vs other Plan MRI lower extremity right - ordered as MRI femur right w/ contrast.

## 2013-09-15 ENCOUNTER — Ambulatory Visit
Admission: RE | Admit: 2013-09-15 | Discharge: 2013-09-15 | Disposition: A | Discharge status: Home / Self Care | Payer: Medicare Other | Source: Ambulatory Visit | Attending: Internal Medicine | Admitting: Internal Medicine

## 2013-09-15 DIAGNOSIS — M7989 Other specified soft tissue disorders: Secondary | ICD-10-CM | POA: Diagnosis not present

## 2013-09-15 DIAGNOSIS — R2241 Localized swelling, mass and lump, right lower limb: Secondary | ICD-10-CM

## 2013-09-15 MED ORDER — GADOBENATE DIMEGLUMINE 529 MG/ML IV SOLN
10.0000 mL | Freq: Once | INTRAVENOUS | Status: AC | PRN
  Administered 2013-09-15: 10 mL via INTRAVENOUS

## 2013-11-28 ENCOUNTER — Emergency Department (HOSPITAL_COMMUNITY): Payer: Medicare Other

## 2013-11-28 ENCOUNTER — Emergency Department (HOSPITAL_COMMUNITY)
Admission: EM | Admit: 2013-11-28 | Discharge: 2013-11-28 | Disposition: A | Discharge status: Home / Self Care | Payer: Medicare Other | Attending: Emergency Medicine | Admitting: Emergency Medicine

## 2013-11-28 ENCOUNTER — Encounter (HOSPITAL_COMMUNITY): Payer: Self-pay | Admitting: Emergency Medicine

## 2013-11-28 ENCOUNTER — Telehealth: Payer: Self-pay | Admitting: Internal Medicine

## 2013-11-28 DIAGNOSIS — M5126 Other intervertebral disc displacement, lumbar region: Secondary | ICD-10-CM | POA: Diagnosis not present

## 2013-11-28 DIAGNOSIS — IMO0001 Reserved for inherently not codable concepts without codable children: Secondary | ICD-10-CM | POA: Diagnosis not present

## 2013-11-28 DIAGNOSIS — R269 Unspecified abnormalities of gait and mobility: Secondary | ICD-10-CM | POA: Diagnosis not present

## 2013-11-28 DIAGNOSIS — Z85828 Personal history of other malignant neoplasm of skin: Secondary | ICD-10-CM | POA: Insufficient documentation

## 2013-11-28 DIAGNOSIS — Z8719 Personal history of other diseases of the digestive system: Secondary | ICD-10-CM | POA: Insufficient documentation

## 2013-11-28 DIAGNOSIS — D1720 Benign lipomatous neoplasm of skin and subcutaneous tissue of unspecified limb: Secondary | ICD-10-CM | POA: Insufficient documentation

## 2013-11-28 DIAGNOSIS — Z79899 Other long term (current) drug therapy: Secondary | ICD-10-CM | POA: Insufficient documentation

## 2013-11-28 DIAGNOSIS — Z8546 Personal history of malignant neoplasm of prostate: Secondary | ICD-10-CM | POA: Diagnosis not present

## 2013-11-28 DIAGNOSIS — Z8639 Personal history of other endocrine, nutritional and metabolic disease: Secondary | ICD-10-CM | POA: Diagnosis not present

## 2013-11-28 DIAGNOSIS — M5410 Radiculopathy, site unspecified: Secondary | ICD-10-CM

## 2013-11-28 DIAGNOSIS — I10 Essential (primary) hypertension: Secondary | ICD-10-CM | POA: Diagnosis not present

## 2013-11-28 DIAGNOSIS — M549 Dorsalgia, unspecified: Secondary | ICD-10-CM | POA: Insufficient documentation

## 2013-11-28 DIAGNOSIS — IMO0002 Reserved for concepts with insufficient information to code with codable children: Secondary | ICD-10-CM | POA: Diagnosis not present

## 2013-11-28 DIAGNOSIS — Z862 Personal history of diseases of the blood and blood-forming organs and certain disorders involving the immune mechanism: Secondary | ICD-10-CM | POA: Diagnosis not present

## 2013-11-28 MED ORDER — HYDROMORPHONE HCL PF 1 MG/ML IJ SOLN: 0.5000 mg | INTRAMUSCULAR | Status: DC | PRN

## 2013-11-28 MED ORDER — DIAZEPAM 5 MG/ML IJ SOLN
5.0000 mg | Freq: Once | INTRAMUSCULAR | Status: AC
  Administered 2013-11-28: 5 mg via INTRAVENOUS
  Filled 2013-11-28: qty 2

## 2013-11-28 MED ORDER — OXYCODONE-ACETAMINOPHEN 5-325 MG PO TABS: 2.0000 | ORAL_TABLET | ORAL | Status: DC | PRN

## 2013-11-28 MED ORDER — DEXAMETHASONE SODIUM PHOSPHATE 10 MG/ML IJ SOLN
10.0000 mg | Freq: Once | INTRAMUSCULAR | Status: AC
  Administered 2013-11-28: 10 mg via INTRAVENOUS
  Filled 2013-11-28: qty 1

## 2013-11-28 MED ORDER — MORPHINE SULFATE 4 MG/ML IJ SOLN
4.0000 mg | INTRAMUSCULAR | Status: AC | PRN
  Administered 2013-11-28 (×3): 4 mg via INTRAVENOUS
  Filled 2013-11-28 (×3): qty 1

## 2013-11-28 MED ORDER — NAPROXEN 500 MG PO TABS: 500.0000 mg | ORAL_TABLET | Freq: Two times a day (BID) | ORAL | Status: DC

## 2013-11-28 MED ORDER — HYDROMORPHONE HCL PF 1 MG/ML IJ SOLN
1.0000 mg | Freq: Once | INTRAMUSCULAR | Status: AC
  Administered 2013-11-28: 1 mg via INTRAVENOUS
  Filled 2013-11-28: qty 1

## 2013-11-28 MED ORDER — KETOROLAC TROMETHAMINE 15 MG/ML IJ SOLN
30.0000 mg | Freq: Once | INTRAMUSCULAR | Status: AC
  Administered 2013-11-28: 30 mg via INTRAVENOUS
  Filled 2013-11-28: qty 2

## 2013-11-28 MED ORDER — ONDANSETRON HCL 4 MG/2ML IJ SOLN
4.0000 mg | Freq: Once | INTRAMUSCULAR | Status: AC
  Administered 2013-11-28: 4 mg via INTRAVENOUS
  Filled 2013-11-28: qty 2

## 2013-11-28 MED ORDER — METHYLPREDNISOLONE 4 MG PO KIT: PACK | ORAL | Status: DC

## 2013-11-28 NOTE — ED Notes (Signed)
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 Pt reports he started having intense leg pain this weekend. At present pain 10/10, could ambulate with pain. Pt does not think tumor has increased in size. Pt went to pcp this morning but would not be able to see doctor till 1630 so came to ED.

## 2013-11-28 NOTE — Discharge Instructions (Signed)
Radicular Pain Radicular pain in either the arm or leg is usually from a bulging or herniated disk in the spine. A piece of the herniated disk may press against the nerves as the nerves exit the spine. This causes pain which is felt at the tips of the nerves down the arm or leg. Other causes of radicular pain may include:  Fractures.  Heart disease.  Cancer.  An abnormal and usually degenerative state of the nervous system or nerves (neuropathy). Diagnosis may require CT or MRI scanning to determine the primary cause.  Nerves that start at the neck (nerve roots) may cause radicular pain in the outer shoulder and arm. It can spread down to the thumb and fingers. The symptoms vary depending on which nerve root has been affected. In most cases radicular pain improves with conservative treatment. Neck problems may require physical therapy, a neck collar, or cervical traction. Treatment may take many weeks, and surgery may be considered if the symptoms do not improve.  Conservative treatment is also recommended for sciatica. Sciatica causes pain to radiate from the lower back or buttock area down the leg into the foot. Often there is a history of back problems. Most patients with sciatica are better after 2 to 4 weeks of rest and other supportive care. Short term bed rest can reduce the disk pressure considerably. Sitting, however, is not a good position since this increases the pressure on the disk. You should avoid bending, lifting, and all other activities which make the problem worse. Traction can be used in severe cases. Surgery is usually reserved for patients who do not improve within the first months of treatment. Only take over-the-counter or prescription medicines for pain, discomfort, or fever as directed by your caregiver. Narcotics and muscle relaxants may help by relieving more severe pain and spasm and by providing mild sedation. Cold or massage can give significant relief. Spinal manipulation  is not recommended. It can increase the degree of disc protrusion. Epidural steroid injections are often effective treatment for radicular pain. These injections deliver medicine to the spinal nerve in the space between the protective covering of the spinal cord and back bones (vertebrae). Your caregiver can give you more information about steroid injections. These injections are most effective when given within two weeks of the onset of pain.  You should see your caregiver for follow up care as recommended. A program for neck and back injury rehabilitation with stretching and strengthening exercises is an important part of management.  SEEK IMMEDIATE MEDICAL CARE IF:  You develop increased pain, weakness, or numbness in your arm or leg.  You develop difficulty with bladder or bowel control.  You develop abdominal pain. Document Released: 10/30/2004 Document Revised: 12/15/2011 Document Reviewed: 01/15/2009 ExitCare Patient Information 2014 [employer].  

## 2013-11-28 NOTE — Telephone Encounter (Signed)
11/28/2013  Pt states he is in extreme pain in in leg when standing.  Wants to have referral for surgery.  Lipmona. Issues.  Please contact in regards.  Phone # [phone]

## 2013-11-28 NOTE — Telephone Encounter (Signed)
Tried to call to assess acuity. No answer. Referral to [facility] placed.

## 2013-11-29 ENCOUNTER — Telehealth: Payer: Self-pay | Admitting: Internal Medicine

## 2013-11-29 NOTE — Telephone Encounter (Signed)
Patient has been informed. He asks should he also be taking the Prednisone that was prescribed to him ?

## 2013-11-29 NOTE — Telephone Encounter (Signed)
Pt called to inform Dr. Linda Hedges that he went into ER yesterday for leg pain. Pt had an MRI and the result was herniated disc (Dr. Jeneen Rinks was the one who did the MRI). Pt stated he still in pain but he start the steroid today.

## 2013-11-29 NOTE — Telephone Encounter (Signed)
Called patient. He may use the percocet. He should take the medrol dose pak and if there is recurrent pain as he tapers down he should call for an extension of the steroid taper. For acute worsening of pain - will need NS eval.

## 2013-11-29 NOTE — Telephone Encounter (Signed)
Chart reviewed: L3-4 bulging disk with possible encroachment right foramen.   Agree with medrol dose pak. If symptoms do not improve or if they get worse will refer to NS

## 2013-11-29 NOTE — ED Provider Notes (Signed)
CSN: [id number]     Arrival date & time 11/28/13  1129 History   First MD Initiated Contact with Patient 11/28/13 1221     Chief Complaint  Patient presents with  . Leg Pain     ) HPI  Patient presents with back and right leg pain. He had MRI in December that showed a large soft tissue abscess in the anterior compartment of his right thigh consistent with lipoma. He states it is really not been particularly uncomfortable or painful to him. He awakened this morning and had pain in his back and pain into his right leg slightly in the thigh into the medial knee. No pain to the medial foot/ floor. No weakness, no fever, no bowel or bladder changes with incontinence or retention. He is ambulatory although it is painful with ambulation.  Past Medical History  Diagnosis Date  . Other and unspecified hyperlipidemia   . Other specified personal history presenting hazards to health(V15.89)   . Basal cell carcinoma   . Malignant neoplasm of prostate   . Unspecified essential hypertension   . Gallstone    Past Surgical History  Procedure Laterality Date  . Tonsillectomy    . Colonoscopy    . Polypectomy     Family History  Problem Relation Age of Onset  . Cancer Mother     colon  . Colon cancer Mother   . Cancer Father     colon  . Colon cancer Father   . Hypertension Other   . Stroke Other    History  Substance Use Topics  . Smoking status: Never Smoker   . Smokeless tobacco: Never Used  . Alcohol Use: 0.5 oz/week    1 drink(s) per week    Review of Systems  Constitutional: Negative for fever, chills, diaphoresis, appetite change and fatigue.  HENT: Negative for mouth sores, sore throat and trouble swallowing.   Eyes: Negative for visual disturbance.  Respiratory: Negative for cough, chest tightness, shortness of breath and wheezing.   Cardiovascular: Negative for chest pain.  Gastrointestinal: Negative for nausea, vomiting, abdominal pain, diarrhea and abdominal distention.   Endocrine: Negative for polydipsia, polyphagia and polyuria.  Genitourinary: Negative for dysuria, frequency and hematuria.  Musculoskeletal: Positive for arthralgias, back pain, gait problem and myalgias.  Skin: Negative for color change, pallor and rash.  Neurological: Negative for dizziness, syncope, light-headedness and headaches.  Hematological: Does not bruise/bleed easily.  Psychiatric/Behavioral: Negative for behavioral problems and confusion.      Allergies  Doxazosin mesylate  Home Medications   Current Outpatient Rx  Name  Route  Sig  Dispense  Refill  . furosemide (LASIX) 20 MG tablet   Oral   Take 1 tablet (20 mg total) by mouth daily.   90 tablet   3   . lisinopril (PRINIVIL,ZESTRIL) 20 MG tablet   Oral   Take 1 tablet (20 mg total) by mouth daily.   90 tablet   3   . Multiple Vitamin (MULTIVITAMIN WITH MINERALS) TABS tablet   Oral   Take 1 tablet by mouth daily.         . ranitidine (ZANTAC) 150 MG capsule   Oral   Take 150 mg by mouth every evening.           . methylPREDNISolone (MEDROL DOSEPAK) 4 MG tablet      X 6 days as directed   21 tablet   0   . Multiple Vitamins-Minerals (MULTIVITAMIN WITH MINERALS) tablet   Oral  Take 1 tablet by mouth daily.           . naproxen (NAPROSYN) 500 MG tablet   Oral   Take 1 tablet (500 mg total) by mouth 2 (two) times daily.   30 tablet   0   . oxyCODONE-acetaminophen (PERCOCET/ROXICET) 5-325 MG per tablet   Oral   Take 2 tablets by mouth every 4 (four) hours as needed.   6 tablet   0    BP 160/77  Pulse 86  Temp(Src) 98.1 F (36.7 C) (Oral)  Resp 18  SpO2 94% Physical Exam  Constitutional: He is oriented to person, place, and time. He appears well-developed and well-nourished. No distress.  HENT:  Head: Normocephalic.  Eyes: Conjunctivae are normal. Pupils are equal, round, and reactive to light. No scleral icterus.  Neck: Normal range of motion. Neck supple. No thyromegaly  present.  Cardiovascular: Normal rate and regular rhythm.  Exam reveals no gallop and no friction rub.   No murmur heard. Pulmonary/Chest: Effort normal and breath sounds normal. No respiratory distress. He has no wheezes. He has no rales.  Abdominal: Soft. Bowel sounds are normal. He exhibits no distension. There is no tenderness. There is no rebound.  Musculoskeletal: Normal range of motion.       Legs: Normal symmetric Strength to shoulder shrug, triceps, biceps, grip,wrist flex/extend,and intrinsics  Norma lsymmetric sensation above and below clavicles, and to all distributions to UEs. Norma symmetric strength to flex/.extend hip and knees, dorsi/plantar flex ankles. Normal symmetric sensation to all distributions to LEs Patellar and achilles reflexes 1-2+. Downgoing Babinski   Neurological: He is alert and oriented to person, place, and time.  Skin: Skin is warm and dry. No rash noted.  Psychiatric: He has a normal mood and affect. His behavior is normal.    ED Course  Procedures (including critical care time) Labs Review Labs Reviewed - No data to display Imaging Review Mr Lumbar Spine Wo Contrast  11/28/2013   CLINICAL DATA:  Severe low back pain radiating to the right leg. Difficulty walking. History of prostate cancer.  EXAM: MRI LUMBAR SPINE WITHOUT CONTRAST  TECHNIQUE: Multiplanar, multisequence MR imaging was performed. No intravenous contrast was administered.  COMPARISON:  CT abdomen 11/14/2009  FINDINGS: T11-12 and T12-L1 are normal. The distal cord and conus are normal with the conus tip at L1.  L1-2: Annular fissure with annular bulging but no herniated disc material or compressive stenosis.  L2-3: Mild bulging of the disc.  No stenosis.  L3-4: Circumferential protrusion of disc material focally prominent in the right posterior lateral to foraminal to extra foraminal region. Narrowing in the right lateral recess could possibly cause neural compression.  L4-5: Desiccation and  circumferential bulging of the disc. Mild narrowing of the lateral recesses left more than right. No definite neural compression.  L5-S1: Minimal bulging of the disc. Mild facet degeneration. No slippage or stenosis.  IMPRESSION: L3-4: Circumferential disc protrusion more prominent towards the right. Stenosis of the right lateral recess that could cause focal neural compression. Whereas this could relate to the patient's symptoms, many people with this sort finding could be asymptomatic.  L4-5: Circumferential bulging of the disc. Narrowing of both lateral recesses left more than right. This is not quite as severe as seen at the L3-4 level and is more pronounced on the left than the right.   Electronically Signed   By: Nelson Chimes M.D.   On: 11/28/2013 15:21    EKG Interpretation   None  MDM   Final diagnoses:  Radiculopathy    Patient's MRI shows disc protrusion at L3-4 and symmetric to the right. There is stenosis of the right lateral recess. 45 also has diffuse bulging with asymmetric to the right, less so than at L3-4. No sign of bony abnormality that would suggest prostate metastases.  I do not feel his lipoma would cause his area of pain in his low back above in the area of pain in his medial thigh below.  I discussed these findings at length with the patient. Discussed with him that this could be a normal finding, but the area of asymmetric bulge could produce right L3, or L4 radiculopathy.  His pain is fairly well controlled here. He has not have neurological loss, weakness, or bowel or bladder changes. I think is appropriate for outpatient treatment. Will use Medrol, pain control, and primary care followup. Also given him neurosurgical followup to get their opinion regarding his MRI ending as a possible etiology for his symptoms. He has no MRI findings, or objective findings, or subjective complaints of the suggest acute myopathy, cauda equina syndrome. He is ambulatory  here.   Tanna Furry, MD 11/29/13 1002

## 2013-12-13 DIAGNOSIS — IMO0002 Reserved for concepts with insufficient information to code with codable children: Secondary | ICD-10-CM | POA: Diagnosis not present

## 2013-12-13 DIAGNOSIS — M5126 Other intervertebral disc displacement, lumbar region: Secondary | ICD-10-CM | POA: Diagnosis not present

## 2013-12-13 DIAGNOSIS — E663 Overweight: Secondary | ICD-10-CM | POA: Diagnosis not present

## 2013-12-13 DIAGNOSIS — I10 Essential (primary) hypertension: Secondary | ICD-10-CM | POA: Diagnosis not present

## 2014-01-10 ENCOUNTER — Other Ambulatory Visit: Payer: Medicare Other

## 2014-01-10 DIAGNOSIS — N182 Chronic kidney disease, stage 2 (mild): Secondary | ICD-10-CM

## 2014-01-10 DIAGNOSIS — R739 Hyperglycemia, unspecified: Secondary | ICD-10-CM

## 2014-01-10 LAB — COMPREHENSIVE METABOLIC PANEL
ALT: 20 U/L (ref 0–53)
AST: 19 U/L (ref 0–37)
Albumin: 3.8 g/dL (ref 3.5–5.2)
Alkaline Phosphatase: 50 U/L (ref 39–117)
BUN: 22 mg/dL (ref 6–23)
CHLORIDE: 102 meq/L (ref 96–112)
CO2: 32 meq/L (ref 19–32)
Calcium: 9.2 mg/dL (ref 8.4–10.5)
Creatinine, Ser: 1.5 mg/dL (ref 0.4–1.5)
GFR: 48.48 mL/min — AB (ref >60.00)
GLUCOSE: 118 mg/dL — AB (ref 70–99)
Potassium: 4.1 mEq/L (ref 3.5–5.1)
SODIUM: 138 meq/L (ref 135–145)
Total Bilirubin: 1.2 mg/dL (ref 0.3–1.2)
Total Protein: 6.2 g/dL (ref 6.0–8.3)

## 2014-01-11 ENCOUNTER — Encounter: Payer: Self-pay | Admitting: Internal Medicine

## 2014-02-10 DIAGNOSIS — I10 Essential (primary) hypertension: Secondary | ICD-10-CM | POA: Diagnosis not present

## 2014-02-10 DIAGNOSIS — Z6826 Body mass index (BMI) 26.0-26.9, adult: Secondary | ICD-10-CM | POA: Diagnosis not present

## 2014-02-10 DIAGNOSIS — M549 Dorsalgia, unspecified: Secondary | ICD-10-CM | POA: Diagnosis not present

## 2014-03-03 DIAGNOSIS — Z8546 Personal history of malignant neoplasm of prostate: Secondary | ICD-10-CM | POA: Diagnosis not present

## 2014-03-10 DIAGNOSIS — R351 Nocturia: Secondary | ICD-10-CM | POA: Diagnosis not present

## 2014-03-10 DIAGNOSIS — N529 Male erectile dysfunction, unspecified: Secondary | ICD-10-CM | POA: Diagnosis not present

## 2014-03-10 DIAGNOSIS — Z8546 Personal history of malignant neoplasm of prostate: Secondary | ICD-10-CM | POA: Diagnosis not present

## 2014-04-13 ENCOUNTER — Other Ambulatory Visit: Payer: Medicare Other

## 2014-04-13 DIAGNOSIS — R739 Hyperglycemia, unspecified: Secondary | ICD-10-CM

## 2014-04-13 DIAGNOSIS — N182 Chronic kidney disease, stage 2 (mild): Secondary | ICD-10-CM

## 2014-04-13 LAB — BASIC METABOLIC PANEL
BUN: 20 mg/dL (ref 6–23)
CO2: 28 mEq/L (ref 19–32)
CREATININE: 1.5 mg/dL (ref 0.4–1.5)
Calcium: 9.1 mg/dL (ref 8.4–10.5)
Chloride: 104 mEq/L (ref 96–112)
GFR: 49.59 mL/min — ABNORMAL LOW (ref >60.00)
GLUCOSE: 102 mg/dL — AB (ref 70–99)
Potassium: 3.8 mEq/L (ref 3.5–5.1)
SODIUM: 138 meq/L (ref 135–145)

## 2014-04-13 LAB — HEMOGLOBIN A1C: HEMOGLOBIN A1C: 5.4 % (ref 4.6–6.5)

## 2014-04-14 ENCOUNTER — Telehealth: Payer: Self-pay

## 2014-04-14 NOTE — Telephone Encounter (Signed)
90 day supply/mail order: na Local prescriptions: [facility] [address]  Immunizations due: UTD  A/P:   Flu vaccine--07/2013 Tdap--10/2006 PNA--04/2011 Shingles--08/2008 [facility]--05/2011--Dr Patterson--q 5 years PSA--04/2012--0.46--[facility]

## 2014-04-17 ENCOUNTER — Encounter: Payer: Self-pay | Admitting: Internal Medicine

## 2014-04-17 ENCOUNTER — Ambulatory Visit (INDEPENDENT_AMBULATORY_CARE_PROVIDER_SITE_OTHER): Payer: Medicare Other | Admitting: Internal Medicine

## 2014-04-17 VITALS — BP 133/78 | HR 75 | Temp 98.3°F | Ht 73.0 in | Wt 199.0 lb

## 2014-04-17 DIAGNOSIS — I10 Essential (primary) hypertension: Secondary | ICD-10-CM

## 2014-04-17 MED ORDER — LISINOPRIL 20 MG PO TABS: 20.0000 mg | ORAL_TABLET | Freq: Every day | ORAL | Status: DC

## 2014-04-17 NOTE — Progress Notes (Signed)
Subjective:    Patient ID: [name], male    DOB: 25-Feb-1941, 73 y.o.   MRN: [id number]  DOS:  04/17/2014 Type of visit - description: new pt , transferring from Dr Linda Hedges History: Hypertension, good medication compliance, ambulatory BPs usually okay but has not been taking  his BP lately 5 months ago developed back pain, saw neurosurgery, pain resolved at this time. H/o  prostate cancer, saw urology last month, PSA 1.3. Status post XRT, doing well    ROS In general doing well, denies chest pain, difficulty breathing, nausea or vomiting   Past Medical History  Diagnosis Date  . Hyperlipidemia   . Basal cell carcinoma     sees derm q year  . Malignant neoplasm of prostate ~ 2007  . Gallstone   . Hypertension     Past Surgical History  Procedure Laterality Date  . Tonsillectomy    . Colonoscopy    . Polypectomy      History   Social History  . Marital Status: Married    Spouse Name: N/A    Number of Children: 2  . Years of Education: N/A   Occupational History  . retired--    Social History Main Topics  . Smoking status: Never Smoker   . Smokeless tobacco: Never Used  . Alcohol Use: 0.5 oz/week    1 drink(s) per week  . Drug Use: No  . Sexual Activity: Yes    Partners: Female   Other Topics Concern  . Not on file   Social History Narrative   HSG. College grad.    Married/ marriage is in good health. 2 Daughters. 2 grandchildren living in [place].    Work - retired from [employer] @ 79 and has enjoyed his retirement.    Exercise/ active and continues to bicycle.    ACP - discussed and provided living will packet and referred to https://www.brown.info/.                    Medication List       This list is accurate as of: 04/17/14 11:59 PM.  Always use your most recent med list.               lisinopril 20 MG tablet  Commonly known as:  PRINIVIL,ZESTRIL  Take 1 tablet (20 mg total) by mouth daily.     multivitamin with minerals  tablet  Take 1 tablet by mouth daily.     multivitamin with minerals Tabs tablet  Take 1 tablet by mouth daily.     ranitidine 150 MG capsule  Commonly known as:  ZANTAC  Take 150 mg by mouth every evening.           Objective:   Physical Exam BP 133/78  Pulse 75  Temp(Src) 98.3 F (36.8 C)  Ht 6\' 1"  (1.854 m)  Wt 199 lb (90.266 kg)  BMI 26.26 kg/m2  SpO2 94% General -- alert, well-developed, NAD.   HEENT-- Not pale.  Lungs -- normal respiratory effort, no intercostal retractions, no accessory muscle use, and normal breath sounds.  Heart-- normal rate, regular rhythm, no murmur.  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 Speech normal, gait appropriate for age, strength symmetric and appropriate for age.  Psych-- Cognition and judgment appear intact. Cooperative with normal attention span and concentration. No anxious or depressed appearing.     Assessment & Plan:

## 2014-04-17 NOTE — Progress Notes (Signed)
Pre visit review using our clinic review tool, if applicable. No additional management support is needed unless otherwise documented below in the visit note. 

## 2014-04-17 NOTE — Patient Instructions (Signed)
Stop lasix (furosemide)  Check the  blood pressure 2 or 3 times a week be sure it is between 110/60 and 140/85. Ideal blood pressure is 120/80. If it is consistently higher or lower, let me know   Schedule a physical, fasting 2-3 months from today

## 2014-04-17 NOTE — Assessment & Plan Note (Signed)
BP well-controlled today, ambulatory BPs usually okay but no  readings lately. Is taking ACE inhibitors and diuretics, creatinine has increased, on looking back, pt thinks is increased  since he started to take Lasix. Plan: Hold Lasix , watch BPs, if needed we could add  amlodipine or coreg. RTC 2-3 months

## 2014-04-18 ENCOUNTER — Telehealth: Payer: Self-pay | Admitting: Internal Medicine

## 2014-04-18 NOTE — Telephone Encounter (Signed)
Relevant patient education mailed to patient.  

## 2014-04-26 ENCOUNTER — Telehealth: Payer: Self-pay | Admitting: *Deleted

## 2014-04-26 NOTE — Telephone Encounter (Signed)
Pt did not request . Pharmacy request. Pt verbalized understanding of message below.

## 2014-04-26 NOTE — Telephone Encounter (Signed)
Advise patient, we agreed to stop Lasix unless  his BP is increasing. If  ambulatory BPs are  okay, no need to re-start Lasix

## 2014-04-26 NOTE — Telephone Encounter (Signed)
rx refill - furosemide 20 mg Last OV- 04/17/14 Last refilled- historical provider / okay to refill?

## 2014-04-26 NOTE — Addendum Note (Signed)
Addended by: Peggyann Shoals on: 04/26/2014 04:05 PM   Modules accepted: Orders

## 2014-05-01 ENCOUNTER — Other Ambulatory Visit: Payer: Self-pay | Admitting: Dermatology

## 2014-05-01 DIAGNOSIS — C44211 Basal cell carcinoma of skin of unspecified ear and external auricular canal: Secondary | ICD-10-CM | POA: Diagnosis not present

## 2014-05-01 DIAGNOSIS — D0420 Carcinoma in situ of skin of unspecified ear and external auricular canal: Secondary | ICD-10-CM | POA: Diagnosis not present

## 2014-05-01 DIAGNOSIS — Z85828 Personal history of other malignant neoplasm of skin: Secondary | ICD-10-CM | POA: Diagnosis not present

## 2014-05-01 DIAGNOSIS — L570 Actinic keratosis: Secondary | ICD-10-CM | POA: Diagnosis not present

## 2014-05-01 DIAGNOSIS — D485 Neoplasm of uncertain behavior of skin: Secondary | ICD-10-CM | POA: Diagnosis not present

## 2014-05-05 ENCOUNTER — Encounter: Payer: Self-pay | Admitting: Internal Medicine

## 2014-05-08 ENCOUNTER — Other Ambulatory Visit: Payer: Self-pay | Admitting: *Deleted

## 2014-05-08 MED ORDER — CARVEDILOL 6.25 MG PO TABS: 6.2500 mg | ORAL_TABLET | Freq: Two times a day (BID) | ORAL | Status: DC

## 2014-05-16 ENCOUNTER — Encounter: Payer: Self-pay | Admitting: Internal Medicine

## 2014-06-21 ENCOUNTER — Ambulatory Visit (INDEPENDENT_AMBULATORY_CARE_PROVIDER_SITE_OTHER): Payer: Medicare Other | Admitting: Internal Medicine

## 2014-06-21 ENCOUNTER — Encounter: Payer: Self-pay | Admitting: Internal Medicine

## 2014-06-21 VITALS — BP 120/72 | HR 81 | Temp 97.9°F | Ht 73.0 in | Wt 199.0 lb

## 2014-06-21 DIAGNOSIS — N182 Chronic kidney disease, stage 2 (mild): Secondary | ICD-10-CM

## 2014-06-21 DIAGNOSIS — E785 Hyperlipidemia, unspecified: Secondary | ICD-10-CM

## 2014-06-21 DIAGNOSIS — Z23 Encounter for immunization: Secondary | ICD-10-CM | POA: Diagnosis not present

## 2014-06-21 DIAGNOSIS — Z0000 Encounter for general adult medical examination without abnormal findings: Secondary | ICD-10-CM | POA: Diagnosis not present

## 2014-06-21 DIAGNOSIS — I10 Essential (primary) hypertension: Secondary | ICD-10-CM

## 2014-06-21 NOTE — Assessment & Plan Note (Signed)
Labs

## 2014-06-21 NOTE — Assessment & Plan Note (Addendum)
Since her last visit, he stop Lasix, BP increased, took carvedilol, it causes GI side effects. Currently on lisinopril only, excellent BPs. Plan: Check a BMP. ekg-- no acute changes

## 2014-06-21 NOTE — Assessment & Plan Note (Signed)
Check labs 

## 2014-06-21 NOTE — Progress Notes (Signed)
Subjective:    Patient ID: Brent Coleman, male    DOB: [birth year], 73 y.o.   MRN: [id number]  DOS:  06/21/2014 Type of visit - description :   Here for Medicare AWV:  1. Risk factors based on Past M, S, F history: reviewed 2. Physical Activities:  Bike rides , take walks  3. Depression/mood: neg screening  4. Hearing:  No problemss noted or reported  5. ADL's:  Independent  6. Fall Risk: low risk, prevention discussed   7. home Safety: does feel safe at home  8. Height, weight, & visual acuity: see VS,  Last eye check 08-2013, good reports  9. Counseling: provided 10. Labs ordered based on risk factors: if needed  11. Referral Coordination: if needed 12. Care Plan, see assessment and plan  13. Cognitive Assessment: cognition and motor skills appropriate for age   In addition, today we discussed the following: Hypertension, since the last visit, he had to start carvedilol but developed side effects, currently on lisinopril only and doing well. Ambulatory BPs always less than 140/70. Hyperlipidemia, has a healthy lifestyle Prostate cancer, last visit with urology this year, stable.   ROS No  CP, SOB No palpitations No  blood in the stools. No abdominal pain (-) cough, sputum production (-) wheezing, chest congestion No dysuria, gross hematuria, difficulty urinating     Past Medical History  Diagnosis Date  . Hyperlipidemia   . Basal cell carcinoma     sees derm q year  . Malignant neoplasm of prostate ~ 2007  . Gallstone   . Hypertension     Past Surgical History  Procedure Laterality Date  . Tonsillectomy    . Colonoscopy    . Polypectomy      History   Social History  . Marital Status: Married    Spouse Name: N/A    Number of Children: 2  . Years of Education: N/A   Occupational History  . retired--    Social History Main Topics  . Smoking status: Never Smoker   . Smokeless tobacco: Never Used  . Alcohol Use: 0.5 oz/week    1 drink(s) per  week  . Drug Use: No  . Sexual Activity: Yes    Partners: Female   Other Topics Concern  . Not on file   Social History Narrative   HSG. College grad.    Married/ marriage is in good health. 2 Daughters. 2 grandchildren living in [place].    Work - retired from [employer] @ 60 and has enjoyed his retirement.    Exercise/ active and continues to bicycle.    ACP - discussed and provided living will packet and referred to https://www.brown.info/.                 Family History  Problem Relation Age of Onset  . Colon cancer Father     colon  . Colon cancer Mother   . Hypertension Other   . Stroke Other   . CAD Neg Hx   . Prostate cancer Neg Hx        Medication List       This list is accurate as of: 06/21/14  4:08 PM.  Always use your most recent med list.               lisinopril 20 MG tablet  Commonly known as:  PRINIVIL,ZESTRIL  Take 1 tablet (20 mg total) by mouth daily.     multivitamin with minerals tablet  Take 1 tablet by mouth daily.     multivitamin with minerals Tabs tablet  Take 1 tablet by mouth daily.     ranitidine 150 MG capsule  Commonly known as:  ZANTAC  Take 150 mg by mouth every evening.           Objective:   Physical Exam BP 120/72  Pulse 81  Temp(Src) 97.9 F (36.6 C) (Oral)  Ht 6\' 1"  (1.854 m)  Wt 199 lb (90.266 kg)  BMI 26.26 kg/m2  SpO2 98% General -- alert, well-developed, NAD.  Neck --no thyromegaly , normal carotid pulse  HEENT-- Not pale. Lungs -- normal respiratory effort, no intercostal retractions, no accessory muscle use, and normal breath sounds.  Heart-- normal rate, regular rhythm, no murmur.  Abdomen-- Not distended, good bowel sounds,soft, non-tender. No bruit  Extremities-- no pretibial edema bilaterally  Neurologic--  alert & oriented X3. Speech normal, gait appropriate for age, strength symmetric and appropriate for age.  Psych-- Cognition and judgment appear intact. Cooperative with normal attention  span and concentration. No anxious or depressed appearing.     Assessment & Plan:

## 2014-06-21 NOTE — Patient Instructions (Addendum)
Get labs in the morning, FLP--- hyperlipidemia BMP, CBC, TSH high blood pressure  Check the  blood pressure 2 or 3 times a month  be sure it is between 110/60 and 140/85. Ideal blood pressure is 120/80. If it is consistently higher or lower, let me know  Think about PREVNAR    Please come back to the office 6-8 months  for a routine check up  Depending on your labs,  you may need to come back  fasting     Fall Prevention and [place] cause injuries and can affect all age groups. It is possible to use preventive measures to significantly decrease the likelihood of falls. There are many simple measures which can make your home safer and prevent falls. OUTDOORS  Repair cracks and edges of walkways and driveways.  Remove high doorway thresholds.  Trim shrubbery on the main path into your home.  Have good outside lighting.  Clear walkways of tools, rocks, debris, and clutter.  Check that handrails are not broken and are securely fastened. Both sides of steps should have handrails.  Have leaves, snow, and ice cleared regularly.  Use sand or salt on walkways during winter months.  In the garage, clean up grease or oil spills. BATHROOM  Install night lights.  Install grab bars by the toilet and in the tub and shower.  Use non-skid mats or decals in the tub or shower.  Place a plastic non-slip stool in the shower to sit on, if needed.  Keep floors dry and clean up all water on the floor immediately.  Remove soap buildup in the tub or shower on a regular basis.  Secure bath mats with non-slip, double-sided rug tape.  Remove throw rugs and tripping hazards from the floors. BEDROOMS  Install night lights.  Make sure a bedside light is easy to reach.  Do not use oversized bedding.  Keep a telephone by your bedside.  Have a firm chair with side arms to use for getting dressed.  Remove throw rugs and tripping hazards from the floor. KITCHEN  Keep handles  on pots and pans turned toward the center of the stove. Use back burners when possible.  Clean up spills quickly and allow time for drying.  Avoid walking on wet floors.  Avoid hot utensils and knives.  Position shelves so they are not too high or low.  Place commonly used objects within easy reach.  If necessary, use a sturdy step stool with a grab bar when reaching.  Keep electrical cables out of the way.  Do not use floor polish or wax that makes floors slippery. If you must use wax, use non-skid floor wax.  Remove throw rugs and tripping hazards from the floor. STAIRWAYS  Never leave objects on stairs.  Place handrails on both sides of stairways and use them. Fix any loose handrails. Make sure handrails on both sides of the stairways are as long as the stairs.  Check carpeting to make sure it is firmly attached along stairs. Make repairs to worn or loose carpet promptly.  Avoid placing throw rugs at the top or bottom of stairways, or properly secure the rug with carpet tape to prevent slippage. Get rid of throw rugs, if possible.  Have an electrician put in a light switch at the top and bottom of the stairs. OTHER FALL PREVENTION TIPS  Wear low-heel or rubber-soled shoes that are supportive and fit well. Wear closed toe shoes.  When using a stepladder, make sure  it is fully opened and both spreaders are firmly locked. Do not climb a closed stepladder.  Add color or contrast paint or tape to grab bars and handrails in your home. Place contrasting color strips on first and last steps.  Learn and use mobility aids as needed. Install an electrical emergency response system.  Turn on lights to avoid dark areas. Replace light bulbs that burn out immediately. Get light switches that glow.  Arrange furniture to create clear pathways. Keep furniture in the same place.  Firmly attach carpet with non-skid or double-sided tape.  Eliminate uneven floor surfaces.  Select a  carpet pattern that does not visually hide the edge of steps.  Be aware of all pets. OTHER HOME SAFETY TIPS  Set the water temperature for 120 F (48.8 C).  Keep emergency numbers on or near the telephone.  Keep smoke detectors on every level of the home and near sleeping areas. Document Released: 09/12/2002 Document Revised: 03/23/2012 Document Reviewed: 12/12/2011 [facility] Patient Information 2015 [place], [place]. This information is not intended to replace advice given to you by your health care provider. Make sure you discuss any questions you have with your health care provider.

## 2014-06-21 NOTE — Progress Notes (Signed)
Pre visit review using our clinic review tool, if applicable. No additional management support is needed unless otherwise documented below in the visit note. 

## 2014-06-21 NOTE — Assessment & Plan Note (Addendum)
Td 2008 pnm 23 --- 2012 prevnar-- declined zostavax-- 2009 Flu shot today Last Cscope 2012, Dr Sharlett Iles, 2 polyps, 5 years  Diet and exercise discussed

## 2014-06-22 ENCOUNTER — Other Ambulatory Visit (INDEPENDENT_AMBULATORY_CARE_PROVIDER_SITE_OTHER): Payer: Medicare Other

## 2014-06-22 DIAGNOSIS — E785 Hyperlipidemia, unspecified: Secondary | ICD-10-CM

## 2014-06-22 DIAGNOSIS — I10 Essential (primary) hypertension: Secondary | ICD-10-CM

## 2014-06-22 LAB — BASIC METABOLIC PANEL
BUN: 25 mg/dL — ABNORMAL HIGH (ref 6–23)
CALCIUM: 8.8 mg/dL (ref 8.4–10.5)
CHLORIDE: 104 meq/L (ref 96–112)
CO2: 27 mEq/L (ref 19–32)
CREATININE: 1.6 mg/dL — AB (ref 0.4–1.5)
GFR: 46.62 mL/min — ABNORMAL LOW (ref >60.00)
Glucose, Bld: 106 mg/dL — ABNORMAL HIGH (ref 70–99)
Potassium: 4.2 mEq/L (ref 3.5–5.1)
SODIUM: 137 meq/L (ref 135–145)

## 2014-06-22 LAB — LIPID PANEL
Cholesterol: 212 mg/dL — ABNORMAL HIGH (ref 0–200)
HDL: 31.4 mg/dL — AB (ref >39.00)
NonHDL: 180.6
Total CHOL/HDL Ratio: 7
Triglycerides: 233 mg/dL — ABNORMAL HIGH (ref 0.0–149.0)
VLDL: 46.6 mg/dL — AB (ref 0.0–40.0)

## 2014-06-22 LAB — TSH: TSH: 1.55 u[IU]/mL (ref 0.35–4.50)

## 2014-06-22 LAB — CBC
HEMATOCRIT: 46.1 % (ref 39.0–52.0)
Hemoglobin: 15.8 g/dL (ref 13.0–17.0)
MCHC: 34.3 g/dL (ref 30.0–36.0)
MCV: 98.5 fl (ref 78.0–100.0)
Platelets: 144 10*3/uL — ABNORMAL LOW (ref 150.0–400.0)
RBC: 4.68 Mil/uL (ref 4.22–5.81)
RDW: 12.6 % (ref 11.5–15.5)
WBC: 5.2 10*3/uL (ref 4.0–10.5)

## 2014-06-22 LAB — LDL CHOLESTEROL, DIRECT: LDL DIRECT: 135.6 mg/dL

## 2014-08-23 ENCOUNTER — Other Ambulatory Visit (INDEPENDENT_AMBULATORY_CARE_PROVIDER_SITE_OTHER): Payer: Medicare Other

## 2014-08-23 DIAGNOSIS — I10 Essential (primary) hypertension: Secondary | ICD-10-CM

## 2014-08-23 LAB — BASIC METABOLIC PANEL
BUN: 20 mg/dL (ref 6–23)
CO2: 28 mEq/L (ref 19–32)
Calcium: 8.8 mg/dL (ref 8.4–10.5)
Chloride: 103 mEq/L (ref 96–112)
Creatinine, Ser: 1.5 mg/dL (ref 0.4–1.5)
GFR: 47.3 mL/min — AB (ref >60.00)
Glucose, Bld: 100 mg/dL — ABNORMAL HIGH (ref 70–99)
POTASSIUM: 4 meq/L (ref 3.5–5.1)
SODIUM: 138 meq/L (ref 135–145)

## 2014-11-24 ENCOUNTER — Encounter: Payer: Self-pay | Admitting: Internal Medicine

## 2014-11-24 ENCOUNTER — Ambulatory Visit (INDEPENDENT_AMBULATORY_CARE_PROVIDER_SITE_OTHER): Payer: Medicare Other | Admitting: Internal Medicine

## 2014-11-24 VITALS — BP 149/80 | HR 73 | Temp 98.2°F | Wt 201.2 lb

## 2014-11-24 DIAGNOSIS — R202 Paresthesia of skin: Secondary | ICD-10-CM | POA: Diagnosis not present

## 2014-11-24 DIAGNOSIS — I10 Essential (primary) hypertension: Secondary | ICD-10-CM

## 2014-11-24 NOTE — Progress Notes (Signed)
Pre visit review using our clinic review tool, if applicable. No additional management support is needed unless otherwise documented below in the visit note. 

## 2014-11-24 NOTE — Patient Instructions (Signed)
Check the  blood pressure 2 or 3 times a month    Be sure your blood pressure is between 110/65 and  145/85.  if it is consistently higher or lower, let me know  Meralgia paresthetica?

## 2014-11-24 NOTE — Progress Notes (Signed)
Subjective:    Patient ID: Brent Coleman, male    DOB: 08/02/1941, 74 y.o.   MRN: [id number]  DOS:  11/24/2014 Type of visit - description : acute Interval history: A month ago he was driving to [place] and become cognitive of the inner left thigh to be slightly asymmetric, different, larger?Marland Kitchen Also the L  anterior thigh felt different, has a hard time describing it but does not describe pain or burning. Concern about his creatinine level   Review of Systems  Denies chest pain, difficulty breathing No calf pain or swelling.  Past Medical History  Diagnosis Date  . Hyperlipidemia   . Basal cell carcinoma     sees derm q year  . Malignant neoplasm of prostate ~ 2007    sees urology q year  . Gallstone   . Hypertension     Past Surgical History  Procedure Laterality Date  . Tonsillectomy    . Colonoscopy    . Polypectomy      History   Social History  . Marital Status: Married    Spouse Name: N/A  . Number of Children: 2  . Years of Education: N/A   Occupational History  . retired--    Social History Main Topics  . Smoking status: Never Smoker   . Smokeless tobacco: Never Used  . Alcohol Use: 0.5 oz/week    1 drink(s) per week     Comment: socially  . Drug Use: No  . Sexual Activity:    Partners: Female   Other Topics Concern  . Not on file   Social History Narrative   HSG. College grad.    Married/ marriage is in good health. 2 Daughters. 2 grandchildren living in [place].    Work - retired from [employer] @ 83 and has enjoyed his retirement.     ACP - discussed and provided living will packet and referred to https://www.brown.info/.                       Medication List       This list is accurate as of: 11/24/14 11:59 PM.  Always use your most recent med list.               lisinopril 20 MG tablet  Commonly known as:  PRINIVIL,ZESTRIL  Take 1 tablet (20 mg total) by mouth daily.     multivitamin with minerals tablet  Take 1  tablet by mouth daily.     ranitidine 150 MG capsule  Commonly known as:  ZANTAC  Take 150 mg by mouth every evening.           Objective:   Physical Exam BP 149/80 mmHg  Pulse 73  Temp(Src) 98.2 F (36.8 C) (Oral)  Wt 201 lb 4 oz (91.286 kg)  SpO2 95%  General:   Well developed, well nourished . NAD.  HEENT:  Normocephalic . Face symmetric, atraumatic GU: Groin without hernias or mass. Scrotal contents normal Lower extremities: Right thigh with a large lipoma, nontender Left leg: Normal to inspection, pinprick examination and palpation. Femoral pulses normal bilaterally. Skin: Not pale. Not jaundice Neurologic:  alert & oriented X3.  Speech normal, gait appropriate for age and unassisted. DTRs symmetric Psych--  Cognition and judgment appear intact.  Cooperative with normal attention span and concentration.  Behavior appropriate. No anxious or depressed appearing.       Assessment & Plan:    Paresthesia left leg,  Atypical meralgia paresthetica?  Recommend observation No evidence of lipoma or a symmetry at the left leg

## 2014-11-26 NOTE — Assessment & Plan Note (Signed)
Hypertension, on lisinopril, creatinine 1.5 which is likely his baseline. Plan is to continue monitoring his kidney function, consider renal ultrasound. BP today slightly elevated, recommend to check in the ambulatory setting

## 2015-01-10 ENCOUNTER — Other Ambulatory Visit: Payer: Self-pay | Admitting: Dermatology

## 2015-01-10 DIAGNOSIS — Z85828 Personal history of other malignant neoplasm of skin: Secondary | ICD-10-CM | POA: Diagnosis not present

## 2015-01-10 DIAGNOSIS — C44219 Basal cell carcinoma of skin of left ear and external auricular canal: Secondary | ICD-10-CM | POA: Diagnosis not present

## 2015-01-10 DIAGNOSIS — Z08 Encounter for follow-up examination after completed treatment for malignant neoplasm: Secondary | ICD-10-CM | POA: Diagnosis not present

## 2015-01-10 DIAGNOSIS — C4441 Basal cell carcinoma of skin of scalp and neck: Secondary | ICD-10-CM | POA: Diagnosis not present

## 2015-01-10 DIAGNOSIS — L570 Actinic keratosis: Secondary | ICD-10-CM | POA: Diagnosis not present

## 2015-01-10 DIAGNOSIS — L821 Other seborrheic keratosis: Secondary | ICD-10-CM | POA: Diagnosis not present

## 2015-01-29 ENCOUNTER — Telehealth: Payer: Self-pay | Admitting: Internal Medicine

## 2015-01-29 NOTE — Telephone Encounter (Signed)
Addendum: Pt is not due until 2018 not 2008.

## 2015-01-29 NOTE — Telephone Encounter (Signed)
Pt is not due for another Tetanus until 2008, however if he requests it, it should be fine as I do not see any contraindication on his med list/allergies. Pt can schedule a nurse visit at his earliest convenience to receive TDAP.

## 2015-01-29 NOTE — Telephone Encounter (Signed)
Relation to pt: self  Call back number: [phone]   Reason for call:  Pt requesting a tetanus booster vaccination. Please advise

## 2015-01-30 NOTE — Telephone Encounter (Signed)
Pt voice understanding and will not schedule TDAP until he is due. Thank you

## 2015-03-07 DIAGNOSIS — Z8546 Personal history of malignant neoplasm of prostate: Secondary | ICD-10-CM | POA: Diagnosis not present

## 2015-03-07 LAB — PSA: PSA: 0.36

## 2015-03-14 DIAGNOSIS — Z8546 Personal history of malignant neoplasm of prostate: Secondary | ICD-10-CM | POA: Diagnosis not present

## 2015-03-14 DIAGNOSIS — R351 Nocturia: Secondary | ICD-10-CM | POA: Diagnosis not present

## 2015-03-14 DIAGNOSIS — N5201 Erectile dysfunction due to arterial insufficiency: Secondary | ICD-10-CM | POA: Diagnosis not present

## 2015-04-02 ENCOUNTER — Other Ambulatory Visit: Payer: Self-pay

## 2015-04-20 ENCOUNTER — Other Ambulatory Visit: Payer: Self-pay | Admitting: Internal Medicine

## 2015-05-08 DIAGNOSIS — L820 Inflamed seborrheic keratosis: Secondary | ICD-10-CM | POA: Diagnosis not present

## 2015-05-08 DIAGNOSIS — Z85828 Personal history of other malignant neoplasm of skin: Secondary | ICD-10-CM | POA: Diagnosis not present

## 2015-05-08 DIAGNOSIS — Z08 Encounter for follow-up examination after completed treatment for malignant neoplasm: Secondary | ICD-10-CM | POA: Diagnosis not present

## 2015-05-08 DIAGNOSIS — L570 Actinic keratosis: Secondary | ICD-10-CM | POA: Diagnosis not present

## 2015-07-24 ENCOUNTER — Encounter: Payer: Self-pay | Admitting: Internal Medicine

## 2015-07-24 ENCOUNTER — Ambulatory Visit (INDEPENDENT_AMBULATORY_CARE_PROVIDER_SITE_OTHER): Payer: Medicare Other | Admitting: Internal Medicine

## 2015-07-24 VITALS — BP 136/92 | HR 86 | Temp 98.0°F | Ht 73.0 in | Wt 196.5 lb

## 2015-07-24 DIAGNOSIS — Z09 Encounter for follow-up examination after completed treatment for conditions other than malignant neoplasm: Secondary | ICD-10-CM | POA: Insufficient documentation

## 2015-07-24 DIAGNOSIS — Z23 Encounter for immunization: Secondary | ICD-10-CM | POA: Diagnosis not present

## 2015-07-24 DIAGNOSIS — R202 Paresthesia of skin: Secondary | ICD-10-CM

## 2015-07-24 DIAGNOSIS — I10 Essential (primary) hypertension: Secondary | ICD-10-CM | POA: Diagnosis not present

## 2015-07-24 DIAGNOSIS — N182 Chronic kidney disease, stage 2 (mild): Secondary | ICD-10-CM

## 2015-07-24 DIAGNOSIS — N189 Chronic kidney disease, unspecified: Secondary | ICD-10-CM | POA: Diagnosis not present

## 2015-07-24 DIAGNOSIS — Z0000 Encounter for general adult medical examination without abnormal findings: Secondary | ICD-10-CM | POA: Insufficient documentation

## 2015-07-24 NOTE — Assessment & Plan Note (Signed)
Not doing a physical exam today but the following information was gathered: Td 2008 Pneumonia shot 2012 Prevnar --declined today. Zostavax 2009 Colonoscopy 2012, was recommended 5 years

## 2015-07-24 NOTE — Progress Notes (Signed)
Pre visit review using our clinic review tool, if applicable. No additional management support is needed unless otherwise documented below in the visit note. 

## 2015-07-24 NOTE — Progress Notes (Signed)
Subjective:    Patient ID: Brent Coleman, male    DOB: [birth year], 74 y.o.   MRN: [id number]  DOS:  07/24/2015 Type of visit - description : Routine visit, request a checkup Interval history:  HTN: Good compliance of medication, ambulatory blood sugars 130/70, 136/92. GERD: on ranitidine, no symptoms History prostate cancer, last visit with urology June 2016. got good reports Still has a ill-defined paresthesias at the front of the left thigh . Wonders if that is a lipoma   Review of Systems He remains very active and feeling well Denies chest pain or difficulty breathing No nausea, vomiting, diarrhea or blood in the stools.   Past Medical History  Diagnosis Date  . Hyperlipidemia   . Basal cell carcinoma     sees derm q year  . Malignant neoplasm of prostate [facility]) ~ 2007    sees urology q year  . Gallstone   . Hypertension     Past Surgical History  Procedure Laterality Date  . Tonsillectomy    . Colonoscopy    . Polypectomy      Social History   Social History  . Marital Status: Married    Spouse Name: N/A  . Number of Children: 2  . Years of Education: N/A   Occupational History  . retired--    Social History Main Topics  . Smoking status: Never Smoker   . Smokeless tobacco: Never Used  . Alcohol Use: 0.5 oz/week    1 drink(s) per week     Comment: socially  . Drug Use: No  . Sexual Activity:    Partners: Female   Other Topics Concern  . Not on file   Social History Narrative   HSG. College grad.    Married/ marriage is in good health. 2 Daughters. 2 grandchildren living in [place].    Work - retired from [employer] @ 61 and has enjoyed his retirement.     ACP - discussed and provided living will packet and referred to https://www.brown.info/.                       Medication List       This list is accurate as of: 07/24/15  6:02 PM.  Always use your most recent med list.               lisinopril 20 MG tablet  Commonly  known as:  PRINIVIL,ZESTRIL  Take 1 tablet (20 mg total) by mouth daily.     multivitamin with minerals tablet  Take 1 tablet by mouth daily.     ranitidine 150 MG capsule  Commonly known as:  ZANTAC  Take 150 mg by mouth every evening.           Objective:   Physical Exam BP 136/92 mmHg  Pulse 86  Temp(Src) 98 F (36.7 C) (Oral)  Ht 6\' 1"  (1.854 m)  Wt 196 lb 8 oz (89.132 kg)  BMI 25.93 kg/m2  SpO2 98% General:   Well developed, well nourished . NAD.  Neck:  Full range of motion. Supple. No  thyromegaly , normal carotid pulse HEENT:  Normocephalic . Face symmetric, atraumatic Lungs:  CTA B Normal respiratory effort, no intercostal retractions, no accessory muscle use. Heart: RRR,  no murmur.  No pretibial edema bilaterally  Abdomen:  Not distended, soft, non-tender. No rebound or rigidity. Extremities: Large right leg lipoma unchanged from previous visit. Skin: Exposed areas without rash. Not pale. Not jaundice Neurologic:  alert & oriented X3.  Speech normal, gait appropriate for age and unassisted Strength symmetric and appropriate for age.  Psych: Cognition and judgment appear intact.  Cooperative with normal attention span and concentration.  Behavior appropriate. No anxious or depressed appearing.    Assessment & Plan:   Assessment > HTN Hyperlipidemia Chronic renal insufficiency GERD Cholelithiasis H/o  Prostate cancer, 2007,  s/p XRT Right leg lipoma, thigh Paresthesia, left leg (meralgia paresthetica?)  Plan HTN, continue present care, check a BMP. Monitor ambulatory BPs. See instructions chronic renal insufficiency: Checking a BMP today  Meralgia paresthetica? Recommend observation Primary care: Flu shot today

## 2015-07-24 NOTE — Patient Instructions (Signed)
Get your blood work before you leave    Check the  blood pressure 2 or 3 times a week  Be sure your blood pressure is between 110/65 and  145/85.  if it is consistently higher or lower, let me know   Next visit  for a physical exam in 6 months, fasting.     Please schedule an appointment at the front desk   Consider   take pneumonia shot called Prevnar  Meralgia paresthetica?

## 2015-07-24 NOTE — Assessment & Plan Note (Signed)
HTN, continue present care, check a BMP. Monitor ambulatory BPs. See instructions chronic renal insufficiency: Checking a BMP today  Meralgia paresthetica? Recommend observation Primary care: Flu shot today

## 2015-07-25 LAB — BASIC METABOLIC PANEL
BUN: 24 mg/dL — ABNORMAL HIGH (ref 6–23)
CO2: 27 meq/L (ref 19–32)
Calcium: 9.4 mg/dL (ref 8.4–10.5)
Chloride: 102 mEq/L (ref 96–112)
Creatinine, Ser: 1.39 mg/dL (ref 0.40–1.50)
GFR: 52.71 mL/min — ABNORMAL LOW (ref >60.00)
Glucose, Bld: 95 mg/dL (ref 70–99)
POTASSIUM: 4.3 meq/L (ref 3.5–5.1)
Sodium: 138 mEq/L (ref 135–145)

## 2015-09-17 ENCOUNTER — Other Ambulatory Visit: Payer: Self-pay

## 2015-09-17 ENCOUNTER — Telehealth: Payer: Self-pay | Admitting: Internal Medicine

## 2015-09-17 MED ORDER — LISINOPRIL 20 MG PO TABS: 20.0000 mg | ORAL_TABLET | Freq: Every day | ORAL | Status: DC

## 2015-09-17 NOTE — Telephone Encounter (Signed)
Rx sent 

## 2015-09-17 NOTE — Telephone Encounter (Signed)
Patient would like Rx to go to new pharmacy

## 2015-09-17 NOTE — Telephone Encounter (Signed)
Lisinopril was sent to pharmacy today at 3:03 PM.

## 2015-09-17 NOTE — Telephone Encounter (Signed)
Relation to [id number] Call back [place]: [place] [phone] (new pharmacy)  Reason for call:  Patient requesting lisinopril (PRINIVIL,ZESTRIL) 20 MG tablet please send to  [place], [address], [place] [address] [phone]

## 2015-10-24 DIAGNOSIS — H11049 Peripheral pterygium, stationary, unspecified eye: Secondary | ICD-10-CM | POA: Diagnosis not present

## 2015-10-24 DIAGNOSIS — H35033 Hypertensive retinopathy, bilateral: Secondary | ICD-10-CM | POA: Diagnosis not present

## 2015-10-24 DIAGNOSIS — H0236 Blepharochalasis left eye, unspecified eyelid: Secondary | ICD-10-CM | POA: Diagnosis not present

## 2015-10-24 DIAGNOSIS — H2513 Age-related nuclear cataract, bilateral: Secondary | ICD-10-CM | POA: Diagnosis not present

## 2015-10-24 DIAGNOSIS — H43813 Vitreous degeneration, bilateral: Secondary | ICD-10-CM | POA: Diagnosis not present

## 2015-11-13 DIAGNOSIS — L570 Actinic keratosis: Secondary | ICD-10-CM | POA: Diagnosis not present

## 2016-01-31 ENCOUNTER — Ambulatory Visit (INDEPENDENT_AMBULATORY_CARE_PROVIDER_SITE_OTHER): Payer: Medicare Other | Admitting: Internal Medicine

## 2016-01-31 ENCOUNTER — Encounter: Payer: Self-pay | Admitting: Internal Medicine

## 2016-01-31 DIAGNOSIS — Z0000 Encounter for general adult medical examination without abnormal findings: Secondary | ICD-10-CM | POA: Diagnosis not present

## 2016-01-31 DIAGNOSIS — I10 Essential (primary) hypertension: Secondary | ICD-10-CM | POA: Diagnosis not present

## 2016-01-31 DIAGNOSIS — Z23 Encounter for immunization: Secondary | ICD-10-CM | POA: Diagnosis not present

## 2016-01-31 DIAGNOSIS — Z09 Encounter for follow-up examination after completed treatment for conditions other than malignant neoplasm: Secondary | ICD-10-CM

## 2016-01-31 DIAGNOSIS — E7800 Pure hypercholesterolemia, unspecified: Secondary | ICD-10-CM

## 2016-01-31 DIAGNOSIS — E785 Hyperlipidemia, unspecified: Secondary | ICD-10-CM | POA: Diagnosis not present

## 2016-01-31 LAB — BASIC METABOLIC PANEL
BUN: 23 mg/dL (ref 6–23)
CALCIUM: 9.1 mg/dL (ref 8.4–10.5)
CO2: 30 mEq/L (ref 19–32)
Chloride: 103 mEq/L (ref 96–112)
Creatinine, Ser: 1.39 mg/dL (ref 0.40–1.50)
GFR: 52.64 mL/min — ABNORMAL LOW (ref >60.00)
Glucose, Bld: 102 mg/dL — ABNORMAL HIGH (ref 70–99)
Potassium: 4.1 mEq/L (ref 3.5–5.1)
Sodium: 139 mEq/L (ref 135–145)

## 2016-01-31 LAB — CBC
HCT: 45.9 % (ref 39.0–52.0)
Hemoglobin: 15.9 g/dL (ref 13.0–17.0)
MCHC: 34.6 g/dL (ref 30.0–36.0)
MCV: 96.5 fl (ref 78.0–100.0)
Platelets: 140 10*3/uL — ABNORMAL LOW (ref 150.0–400.0)
RBC: 4.76 Mil/uL (ref 4.22–5.81)
RDW: 12.7 % (ref 11.5–15.5)
WBC: 5.3 10*3/uL (ref 4.0–10.5)

## 2016-01-31 LAB — LIPID PANEL
CHOL/HDL RATIO: 5
Cholesterol: 199 mg/dL (ref 0–200)
HDL: 37.1 mg/dL — AB (ref >39.00)
NONHDL: 161.74
TRIGLYCERIDES: 213 mg/dL — AB (ref 0.0–149.0)
VLDL: 42.6 mg/dL — ABNORMAL HIGH (ref 0.0–40.0)

## 2016-01-31 LAB — LDL CHOLESTEROL, DIRECT: LDL DIRECT: 97 mg/dL

## 2016-01-31 LAB — AST: AST: 17 U/L (ref 0–37)

## 2016-01-31 LAB — ALT: ALT: 20 U/L (ref 0–53)

## 2016-01-31 NOTE — Assessment & Plan Note (Signed)
Td 2008 Pneumonia shot 2012 Prevnar --  4-17 Zostavax 2009 Colonoscopy 2012, was recommended 5 years--- refer to GI PSAs. Urology Based on cardiovascular risk factors, recommend aspirin 81 Diet and exercise discussed

## 2016-01-31 NOTE — Assessment & Plan Note (Signed)
HTN: Continue lisinopril, check a BMP, CBC Hyperlipidemia: On no medications, check a FLP, AST, ALT Prostate  cancer: Sees urology yearly, will see 1 or 2 more times then we'll follow-up by PCP;  Occasionally has gas incontinence-- see history of present illness, related to previous prostate cancer XRT? Recommend to discuss with urology. rtc 8 months

## 2016-01-31 NOTE — Progress Notes (Signed)
Subjective:    Patient ID: Brent Coleman, male    DOB: 1941/01/15, 75 y.o.   MRN: [id number]  DOS:  01/31/2016 Type of visit - description :  Interval history: Here for Medicare AWV:  1. Risk factors based on Past M, S, F history: reviewed 2. Physical Activities:   3. Depression/mood: neg screening  4. Hearing:  No problems noted or reported  5. ADL's: independent, drives  6. Fall Risk: no recent falls, prevention discussed , see AVS 7. home Safety: does feel safe at home  8. Height, weight, & visual acuity: see VS, sees eye doctor regulalrly 9. Counseling: provided 10. Labs ordered based on risk factors: if needed  11. Referral Coordination: if needed 12. Care Plan, see assessment and plan , written personalized plan provided , see AVS 13. Cognitive Assessment: motor skills and cognition appropriate for age 40. Care team updated   15. End-of-life care -- [facility] POA discussed   In addition, today we discussed the following: HTN: Good compliance of medication Hyperlipidemia: Currently on no medications Occasionally has sphincter incontinence, passing gas with cough. Denies any  rectal pain-itching, rectal bleeding or accidents with stools.   Review of Systems   Constitutional: No fever. No chills. No unexplained wt changes. No unusual sweats  HEENT: No dental problems, no ear discharge, no facial swelling, no voice changes. No eye discharge, no eye  redness , no  intolerance to light   Respiratory: No wheezing , no  difficulty breathing. No cough , no mucus production  Cardiovascular: No CP, no leg swelling , no  Palpitations  GI: no nausea, no vomiting, no diarrhea , no  abdominal pain.  No blood in the stools. No dysphagia, no odynophagia    Endocrine: No polyphagia, no polyuria , no polydipsia  GU: No dysuria, gross hematuria, difficulty urinating. No urinary urgency, no frequency.  Musculoskeletal: No joint swellings or unusual aches or pains  Skin: No change in  the color of the skin, palor , no  Rash  Allergic, immunologic: No environmental allergies , no  food allergies  Neurological: No dizziness no  syncope. No headaches. No diplopia, no slurred, no slurred speech, no motor deficits, no facial  Numbness  Hematological: No enlarged lymph nodes, no easy bruising , no unusual bleedings  Psychiatry: No suicidal ideas, no hallucinations, no beavior problems, no confusion.  No unusual/severe anxiety, no depression     Past Medical History  Diagnosis Date  . Hyperlipidemia   . Basal cell carcinoma     sees derm q year  . Malignant neoplasm of prostate [facility]) ~ 2007    sees urology q year  . Gallstone   . Hypertension     Past Surgical History  Procedure Laterality Date  . Tonsillectomy    . Colonoscopy    . Polypectomy      Social History   Social History  . Marital Status: Married    Spouse Name: N/A  . Number of Children: 2  . Years of Education: N/A   Occupational History  . retired--    Social History Main Topics  . Smoking status: Never Smoker   . Smokeless tobacco: Never Used  . Alcohol Use: 0.5 oz/week    1 drink(s) per week     Comment: socially  . Drug Use: No  . Sexual Activity:    Partners: Female   Other Topics Concern  . Not on file   Social History Narrative   HSG. College grad.  Married/ marriage is in good health. 2 Daughters. 2 grandchildren living in [place].    Work - retired from [employer] @ 62 and has enjoyed his retirement.     ACP - discussed and provided living will packet and referred to https://www.brown.info/.                    Family History  Problem Relation Age of Onset  . Colon cancer Father     colon  . Colon cancer Mother   . Hypertension Other   . Stroke Other   . CAD Neg Hx   . Prostate cancer Neg Hx   . Diabetes Neg Hx        Medication List       This list is accurate as of: 01/31/16  1:40 PM.  Always use your most recent med list.                aspirin 81 MG tablet  Take 81 mg by mouth daily.     lisinopril 20 MG tablet  Commonly known as:  PRINIVIL,ZESTRIL  Take 1 tablet (20 mg total) by mouth daily.     multivitamin with minerals tablet  Take 1 tablet by mouth daily.     ranitidine 150 MG capsule  Commonly known as:  ZANTAC  Take 150 mg by mouth every evening.           Objective:   Physical Exam BP 136/74 mmHg  Pulse 70  Temp(Src) 98.1 F (36.7 C) (Oral)  Ht 6\' 1"  (1.854 m)  Wt 197 lb 12.8 oz (89.721 kg)  BMI 26.10 kg/m2  SpO2 98% General:   Well developed, well nourished . NAD.  Neck: No  Thyromegaly  HEENT:  Normocephalic . Face symmetric, atraumatic Lungs:  CTA B Normal respiratory effort, no intercostal retractions, no accessory muscle use. Heart: RRR,  no murmur.  No pretibial edema bilaterally  Abdomen:  Not distended, soft, non-tender. No rebound or rigidity.   Skin: Exposed areas without rash. Not pale. Not jaundice Neurologic:  alert & oriented X3.  Speech normal, gait appropriate for age and unassisted Strength symmetric and appropriate for age.  Psych: Cognition and judgment appear intact.  Cooperative with normal attention span and concentration.  Behavior appropriate. No anxious or depressed appearing. \    Assessment & Plan:   Assessment HTN Creatinine ~ 1.5 Hyperlipidemia GERD, h/o HH GB stones Prostate cancer, 2007, s/p XRT, Dr Shona Needles Skin cancer, [place]  Plan: HTN: Continue lisinopril, check a BMP, CBC Hyperlipidemia: On no medications, check a FLP, AST, ALT Prostate  cancer: Sees urology yearly, will see 1 or 2 more times then we'll follow-up by PCP;  Occasionally has gas incontinence-- see history of present illness, related to previous prostate cancer XRT? Recommend to discuss with urology. rtc 8 months

## 2016-01-31 NOTE — Patient Instructions (Signed)
Get your blood work before you leave   Please consider visit these websites for more information:  www.begintheconversation.org  theconversationproject.org   Check the  blood pressure 2 or 3 times a month   Be sure your blood pressure is between 110/65 and  145/85.  if it is consistently higher or lower, let me know    Next visit 8 to 10 months      Fall Prevention and Home Safety Falls cause injuries and can affect all age groups. It is possible to use preventive measures to significantly decrease the likelihood of falls. There are many simple measures which can make your home safer and prevent falls. OUTDOORS  Repair cracks and edges of walkways and driveways.  Remove high doorway thresholds.  Trim shrubbery on the main path into your home.  Have good outside lighting.  Clear walkways of tools, rocks, debris, and clutter.  Check that handrails are not broken and are securely fastened. Both sides of steps should have handrails.  Have leaves, snow, and ice cleared regularly.  Use sand or salt on walkways during winter months.  In the garage, clean up grease or oil spills. BATHROOM  Install night lights.  Install grab bars by the toilet and in the tub and shower.  Use non-skid mats or decals in the tub or shower.  Place a plastic non-slip stool in the shower to sit on, if needed.  Keep floors dry and clean up all water on the floor immediately.  Remove soap buildup in the tub or shower on a regular basis.  Secure bath mats with non-slip, double-sided rug tape.  Remove throw rugs and tripping hazards from the floors. BEDROOMS  Install night lights.  Make sure a bedside light is easy to reach.  Do not use oversized bedding.  Keep a telephone by your bedside.  Have a firm chair with side arms to use for getting dressed.  Remove throw rugs and tripping hazards from the floor. KITCHEN  Keep handles on pots and pans turned toward the center of the  stove. Use back burners when possible.  Clean up spills quickly and allow time for drying.  Avoid walking on wet floors.  Avoid hot utensils and knives.  Position shelves so they are not too high or low.  Place commonly used objects within easy reach.  If necessary, use a sturdy step stool with a grab bar when reaching.  Keep electrical cables out of the way.  Do not use floor polish or wax that makes floors slippery. If you must use wax, use non-skid floor wax.  Remove throw rugs and tripping hazards from the floor. STAIRWAYS  Never leave objects on stairs.  Place handrails on both sides of stairways and use them. Fix any loose handrails. Make sure handrails on both sides of the stairways are as long as the stairs.  Check carpeting to make sure it is firmly attached along stairs. Make repairs to worn or loose carpet promptly.  Avoid placing throw rugs at the top or bottom of stairways, or properly secure the rug with carpet tape to prevent slippage. Get rid of throw rugs, if possible.  Have an electrician put in a light switch at the top and bottom of the stairs. OTHER FALL PREVENTION TIPS  Wear low-heel or rubber-soled shoes that are supportive and fit well. Wear closed toe shoes.  When using a stepladder, make sure it is fully opened and both spreaders are firmly locked. Do not climb a closed stepladder.  Add color or contrast paint or tape to grab bars and handrails in your home. Place contrasting color strips on first and last steps.  Learn and use mobility aids as needed. Install an electrical emergency response system.  Turn on lights to avoid dark areas. Replace light bulbs that burn out immediately. Get light switches that glow.  Arrange furniture to create clear pathways. Keep furniture in the same place.  Firmly attach carpet with non-skid or double-sided tape.  Eliminate uneven floor surfaces.  Select a carpet pattern that does not visually hide the edge of  steps.  Be aware of all pets. OTHER HOME SAFETY TIPS  Set the water temperature for 120 F (48.8 C).  Keep emergency numbers on or near the telephone.  Keep smoke detectors on every level of the home and near sleeping areas. Document Released: 09/12/2002 Document Revised: 03/23/2012 Document Reviewed: 12/12/2011 [facility] Patient Information 2015 [place], [place]. This information is not intended to replace advice given to you by your health care provider. Make sure you discuss any questions you have with your health care provider.   Preventive Care for Adults Ages 37 and over  Blood pressure check.** / Every 1 to 2 years.  Lipid and cholesterol check.**/ Every 5 years beginning at age 23.  Lung cancer screening. / Every year if you are aged 30-80 years and have a 30-pack-year history of smoking and currently smoke or have quit within the past 15 years. Yearly screening is stopped once you have quit smoking for at least 15 years or develop a health problem that would prevent you from having lung cancer treatment.  Fecal occult blood test (FOBT) of stool. / Every year beginning at age 84 and continuing until age 34. You may not have to do this test if you get a colonoscopy every 10 years.  Flexible sigmoidoscopy** or colonoscopy.** / Every 5 years for a flexible sigmoidoscopy or every 10 years for a colonoscopy beginning at age 46 and continuing until age 4.  Hepatitis C blood test.** / For all people born from [birth year] through 1967 and any individual with known risks for hepatitis C.  Abdominal aortic aneurysm (AAA) screening.** / A one-time screening for ages 29 to 74 years who are current or former smokers.  Skin self-exam. / Monthly.  Influenza vaccine. / Every year.  Tetanus, diphtheria, and acellular pertussis (Tdap/Td) vaccine.** / 1 dose of Td every 10 years.  Varicella vaccine.** / Consult your health care provider.  Zoster vaccine.** / 1 dose for adults aged 22 years or  older.  Pneumococcal 13-valent conjugate (PCV13) vaccine.** / Consult your health care provider.  Pneumococcal polysaccharide (PPSV23) vaccine.** / 1 dose for all adults aged 25 years and older.  Meningococcal vaccine.** / Consult your health care provider.  Hepatitis A vaccine.** / Consult your health care provider.  Hepatitis B vaccine.** / Consult your health care provider.  Haemophilus influenzae type b (Hib) vaccine.** / Consult your health care provider. **Family history and personal history of risk and conditions may change your health care provider's recommendations. Document Released: 11/18/2001 Document Revised: 09/27/2013 Document Reviewed: 02/17/2011 [facility] Patient Information 2015 [place], [place]. This information is not intended to replace advice given to you by your health care provider. Make sure you discuss any questions you have with your health care provider.

## 2016-01-31 NOTE — Progress Notes (Signed)
Pre visit review using our clinic review tool, if applicable. No additional management support is needed unless otherwise documented below in the visit note. 

## 2016-03-18 ENCOUNTER — Encounter: Payer: Self-pay | Admitting: Internal Medicine

## 2016-03-26 DIAGNOSIS — Z8546 Personal history of malignant neoplasm of prostate: Secondary | ICD-10-CM | POA: Diagnosis not present

## 2016-03-26 LAB — PSA: PSA: 0.25

## 2016-04-02 DIAGNOSIS — R351 Nocturia: Secondary | ICD-10-CM | POA: Diagnosis not present

## 2016-04-02 DIAGNOSIS — C61 Malignant neoplasm of prostate: Secondary | ICD-10-CM | POA: Diagnosis not present

## 2016-04-02 DIAGNOSIS — N5201 Erectile dysfunction due to arterial insufficiency: Secondary | ICD-10-CM | POA: Diagnosis not present

## 2016-04-11 ENCOUNTER — Other Ambulatory Visit: Payer: Self-pay | Admitting: Internal Medicine

## 2016-05-06 ENCOUNTER — Encounter: Payer: Self-pay | Admitting: Internal Medicine

## 2016-06-13 ENCOUNTER — Ambulatory Visit (AMBULATORY_SURGERY_CENTER): Payer: Self-pay | Admitting: *Deleted

## 2016-06-13 VITALS — Ht 73.0 in | Wt 201.0 lb

## 2016-06-13 DIAGNOSIS — Z800 Family history of malignant neoplasm of digestive organs: Secondary | ICD-10-CM

## 2016-06-13 MED ORDER — NA SULFATE-K SULFATE-MG SULF 17.5-3.13-1.6 GM/177ML PO SOLN: 1.0000 | Freq: Once | ORAL | 0 refills | Status: AC

## 2016-06-13 NOTE — Progress Notes (Signed)
No allergies to eggs or soy. No problems with anesthesia.  Pt given Emmi instructions for colonoscopy  No oxygen use  No diet drug use  

## 2016-06-23 ENCOUNTER — Encounter: Payer: Medicare Other | Admitting: Internal Medicine

## 2016-06-24 ENCOUNTER — Encounter: Payer: Self-pay | Admitting: Internal Medicine

## 2016-06-24 ENCOUNTER — Ambulatory Visit (AMBULATORY_SURGERY_CENTER): Payer: Medicare Other | Admitting: Internal Medicine

## 2016-06-24 VITALS — BP 135/74 | HR 56 | Temp 97.5°F | Resp 15 | Ht 73.0 in | Wt 197.0 lb

## 2016-06-24 DIAGNOSIS — D123 Benign neoplasm of transverse colon: Secondary | ICD-10-CM | POA: Diagnosis not present

## 2016-06-24 DIAGNOSIS — I10 Essential (primary) hypertension: Secondary | ICD-10-CM | POA: Diagnosis not present

## 2016-06-24 DIAGNOSIS — D127 Benign neoplasm of rectosigmoid junction: Secondary | ICD-10-CM | POA: Diagnosis not present

## 2016-06-24 DIAGNOSIS — Z1211 Encounter for screening for malignant neoplasm of colon: Secondary | ICD-10-CM

## 2016-06-24 DIAGNOSIS — K635 Polyp of colon: Secondary | ICD-10-CM | POA: Diagnosis not present

## 2016-06-24 DIAGNOSIS — Z86010 Personal history of colonic polyps: Secondary | ICD-10-CM | POA: Diagnosis not present

## 2016-06-24 DIAGNOSIS — Z800 Family history of malignant neoplasm of digestive organs: Secondary | ICD-10-CM

## 2016-06-24 DIAGNOSIS — D125 Benign neoplasm of sigmoid colon: Secondary | ICD-10-CM

## 2016-06-24 MED ORDER — SODIUM CHLORIDE 0.9 % IV SOLN: 500.0000 mL | INTRAVENOUS | Status: DC

## 2016-06-24 NOTE — Progress Notes (Signed)
Called to room to assist during endoscopic procedure.  Patient ID and intended procedure confirmed with present staff. Received instructions for my participation in the procedure from the performing physician.  

## 2016-06-24 NOTE — Progress Notes (Signed)
Give 534ml bolus NS for BP.  VO Dr. Hilarie Fredrickson Margaretmary Eddy RN/ Waldo Laine RN.

## 2016-06-24 NOTE — Progress Notes (Signed)
To recovery awake vss report to RN

## 2016-06-24 NOTE — Op Note (Signed)
[place] Patient Name: Brent Coleman Procedure Date: 06/24/2016 8:37 AM MRN: [id number] Endoscopist: Jerene Bears , MD Age: 75 Referring MD:  Date of Birth: Jan 12, 1941 Gender: Male Account #: [id number] Procedure:                Colonoscopy Indications:              Screening patient at increased risk: Family history                            of colorectal cancer in multiple 1st-degree                            relatives, Last colonoscopy 5 years ago Medicines:                Monitored Anesthesia Care Procedure:                Pre-Anesthesia Assessment:                           - Prior to the procedure, a History and Physical                            was performed, and patient medications and                            allergies were reviewed. The patient's tolerance of                            previous anesthesia was also reviewed. The risks                            and benefits of the procedure and the sedation                            options and risks were discussed with the patient.                            All questions were answered, and informed consent                            was obtained. Prior Anticoagulants: The patient has                            taken no previous anticoagulant or antiplatelet                            agents. ASA Grade Assessment: III - A patient with                            severe systemic disease. After reviewing the risks                            and benefits, the patient was deemed in  satisfactory condition to undergo the procedure.                           After obtaining informed consent, the colonoscope                            was passed under direct vision. Throughout the                            procedure, the patient's blood pressure, pulse, and                            oxygen saturations were monitored continuously. The                            Model CF-HQ190L  [id number]) scope was introduced                            through the anus and advanced to the the cecum,                            identified by appendiceal orifice and ileocecal                            valve. The colonoscopy was performed without                            difficulty. The patient tolerated the procedure                            well. The quality of the bowel preparation was                            good. The ileocecal valve, appendiceal orifice, and                            rectum were photographed. Scope In: 9:10:37 AM Scope Out: 9:26:07 AM Scope Withdrawal Time: 0 hours 13 minutes 2 seconds  Total Procedure Duration: 0 hours 15 minutes 30 seconds  Findings:                 The digital rectal exam was normal.                           A 5 mm polyp was found in the transverse colon. The                            polyp was sessile. The polyp was removed with a                            cold snare. Resection and retrieval were complete.                           A 3 mm polyp was found in the recto-sigmoid  colon.                            The polyp was sessile. The polyp was removed with a                            cold snare. Resection and retrieval were complete.                           Multiple diverticula were found in the sigmoid                            colon.                           Internal hemorrhoids were found during                            retroflexion. The hemorrhoids were small. Complications:            No immediate complications. Estimated Blood Loss:     Estimated blood loss: none. Impression:               - One 5 mm polyp in the transverse colon, removed                            with a cold snare. Resected and retrieved.                           - One 3 mm polyp at the recto-sigmoid colon,                            removed with a cold snare. Resected and retrieved.                           - Diverticulosis in the sigmoid  colon.                           - Internal hemorrhoids. Recommendation:           - Patient has a contact number available for                            emergencies. The signs and symptoms of potential                            delayed complications were discussed with the                            patient. Return to normal activities tomorrow.                            Written discharge instructions were provided to the                            patient.                           -  Resume previous diet.                           - Continue present medications.                           - Await pathology results.                           - Repeat colonoscopy is recommended for                            surveillance. The colonoscopy date will be                            determined after pathology results from today's                            exam become available for review. Jerene Bears, MD 06/24/2016 9:34:27 AM This report has been signed electronically.

## 2016-06-24 NOTE — Patient Instructions (Signed)
YOU HAD AN ENDOSCOPIC PROCEDURE TODAY AT THE [facility]:   Refer to the procedure report that was given to you for any specific questions about what was found during the examination.  If the procedure report does not answer your questions, please call your gastroenterologist to clarify.  If you requested that your care partner not be given the details of your procedure findings, then the procedure report has been included in a sealed envelope for you to review at your convenience later.  YOU SHOULD EXPECT: Some feelings of bloating in the abdomen. Passage of more gas than usual.  Walking can help get rid of the air that was put into your GI tract during the procedure and reduce the bloating. If you had a lower endoscopy (such as a colonoscopy or flexible sigmoidoscopy) you may notice spotting of blood in your stool or on the toilet paper. If you underwent a bowel prep for your procedure, you may not have a normal bowel movement for a few days.  Please Note:  You might notice some irritation and congestion in your nose or some drainage.  This is from the oxygen used during your procedure.  There is no need for concern and it should clear up in a day or so.  SYMPTOMS TO REPORT IMMEDIATELY:   Following lower endoscopy (colonoscopy or flexible sigmoidoscopy):  Excessive amounts of blood in the stool  Significant tenderness or worsening of abdominal pains  Swelling of the abdomen that is new, acute  Fever of 100F or higher   Following upper endoscopy (EGD)  Vomiting of blood or coffee ground material  New chest pain or pain under the shoulder blades  Painful or persistently difficult swallowing  New shortness of breath  Fever of 100F or higher  Black, tarry-looking stools  For urgent or emergent issues, a gastroenterologist can be reached at any hour by calling [phone].   DIET:  We do recommend a small meal at first, but then you may proceed to your regular diet.  Drink  plenty of fluids but you should avoid alcoholic beverages for 24 hours.  ACTIVITY:  You should plan to take it easy for the rest of today and you should NOT DRIVE or use heavy machinery until tomorrow (because of the sedation medicines used during the test).    FOLLOW UP: Our staff will call the number listed on your records the next business day following your procedure to check on you and address any questions or concerns that you may have regarding the information given to you following your procedure. If we do not reach you, we will leave a message.  However, if you are feeling well and you are not experiencing any problems, there is no need to return our call.  We will assume that you have returned to your regular daily activities without incident.  If any biopsies were taken you will be contacted by phone or by letter within the next 1-3 weeks.  Please call us at [phone] if you have not heard about the biopsies in 3 weeks.    SIGNATURES/CONFIDENTIALITY: You and/or your care partner have signed paperwork which will be entered into your electronic medical record.  These signatures attest to the fact that that the information above on your After Visit Summary has been reviewed and is understood.  Full responsibility of the confidentiality of this discharge information lies with you and/or your care-partner.  Polyp, diverticulosis, high fiber diet and hemorrhoid information given. 

## 2016-06-25 ENCOUNTER — Telehealth: Payer: Self-pay | Admitting: *Deleted

## 2016-06-25 NOTE — Telephone Encounter (Signed)
Message left

## 2016-06-30 ENCOUNTER — Encounter: Payer: Self-pay | Admitting: Internal Medicine

## 2016-08-07 ENCOUNTER — Ambulatory Visit: Payer: Medicare Other

## 2016-08-07 ENCOUNTER — Other Ambulatory Visit: Payer: Self-pay | Admitting: Internal Medicine

## 2016-08-07 DIAGNOSIS — Z23 Encounter for immunization: Secondary | ICD-10-CM

## 2016-09-11 ENCOUNTER — Other Ambulatory Visit: Payer: Self-pay

## 2016-09-26 ENCOUNTER — Encounter: Payer: Self-pay | Admitting: Internal Medicine

## 2016-09-26 ENCOUNTER — Ambulatory Visit (INDEPENDENT_AMBULATORY_CARE_PROVIDER_SITE_OTHER): Payer: Medicare Other | Admitting: Internal Medicine

## 2016-09-26 VITALS — BP 132/76 | HR 68 | Temp 98.3°F | Resp 14 | Ht 73.0 in | Wt 198.0 lb

## 2016-09-26 DIAGNOSIS — H66012 Acute suppurative otitis media with spontaneous rupture of ear drum, left ear: Secondary | ICD-10-CM | POA: Diagnosis not present

## 2016-09-26 MED ORDER — AMOXICILLIN 500 MG PO CAPS: 1000.0000 mg | ORAL_CAPSULE | Freq: Two times a day (BID) | ORAL | 0 refills | Status: DC

## 2016-09-26 MED ORDER — OFLOXACIN 0.3 % OT SOLN: 5.0000 [drp] | Freq: Every day | OTIC | 0 refills | Status: DC

## 2016-09-26 NOTE — Progress Notes (Signed)
Subjective:    Patient ID: Brent Coleman, male    DOB: Apr 25, 1941, 75 y.o.   MRN: [id number]  DOS:  09/26/2016 Type of visit - description :  Acute visit Interval history: Developed severe cold 2 weeks ago: Cough, runny nose, malaise. He is generally doing better, has a mild persistent cough with some greenish sputum. He is here because 3 days ago noted bloody discharge from the left ear, some pain. Also decrease hearing on the left     Review of Systems   Past Medical History:  Diagnosis Date  . Basal cell carcinoma    sees derm q year  . 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 Gallstone   . GERD (gastroesophageal reflux disease)   . Hyperlipidemia   . Hypertension   . Malignant neoplasm of prostate [facility]) ~ 2007   sees urology q year    Past Surgical History:  Procedure Laterality Date  . POLYPECTOMY    . TONSILLECTOMY  1949    Social History   Social History  . Marital status: Married    Spouse name: N/A  . Number of children: 2  . Years of education: N/A   Occupational History  . retired--    Social History Main Topics  . Smoking status: Never Smoker  . Smokeless tobacco: Never Used  . Alcohol use Yes  . Drug use: No  . Sexual activity: Yes    Partners: Female   Other Topics Concern  . Not on file   Social History Narrative   HSG. College grad.    Married/ marriage is in good health. 2 Daughters. 2 grandchildren living in [place].    Work - retired from [employer] @ 74 and has enjoyed his retirement.     ACP - discussed and provided living will packet and referred to [email or web address].info/.                     Allergies as of 09/26/2016      Reactions   Doxazosin Mesylate    REACTION: Abdominal pain      Medication List       Accurate as of 09/26/16 11:59 PM. Always use your most recent med list.          amoxicillin 500 MG capsule Commonly known as:   AMOXIL Take 2 capsules (1,000 mg total) by mouth 2 (two) times daily.   aspirin 81 MG tablet Take 81 mg by mouth daily.   lisinopril 20 MG tablet Commonly known as:  PRINIVIL,ZESTRIL Take 1 tablet (20 mg total) by mouth daily.   multivitamin with minerals tablet Take 1 tablet by mouth daily.   ofloxacin 0.3 % otic solution Commonly known as:  FLOXIN OTIC Place 5 drops into the left ear daily.   ranitidine 150 MG capsule Commonly known as:  ZANTAC Take 150 mg by mouth every evening.          Objective:   Physical Exam BP 132/76 (BP Location: Left Arm, Patient Position: Sitting, Cuff Size: Normal)   Pulse 68   Temp 98.3 F (36.8 C) (Oral)   Resp 14   Ht 6\' 1"  (1.854 m)   Wt 198 lb (89.8 kg)   SpO2 98%   BMI 26.12 kg/m  General:   Well developed, well nourished . NAD.  HEENT:  Normocephalic . Face symmetric, atraumatic. Throat symmetric. Nose is slightly congested.  Right TM: Normal Left TM-red,  I see a small perforation. Small amount of dry brown/red debris. Canal is normal. Mastoids no TTP on either side Lungs:  CTA B Normal respiratory effort, no intercostal retractions, no accessory muscle use. Heart: RRR,  no murmur.  No pretibial edema bilaterally  Skin: Not pale. Not jaundice Neurologic:  alert & oriented X3.  Speech normal, gait appropriate for age and unassisted Psych--  Cognition and judgment appear intact.  Cooperative with normal attention span and concentration.  Behavior appropriate. No anxious or depressed appearing.      Assessment & Plan:   Assessment HTN Creatinine ~ 1.5 Hyperlipidemia GERD, h/o HH GB stones Prostate cancer, 2007, s/p XRT, Dr Shona Needles Skin cancer, [facility]   Plan: Suppurative  otitis: We'll treat with amoxicillin and ofloxacin. Call if not better particularly if his hearing is not coming back, will need to ENT eval.

## 2016-09-26 NOTE — Patient Instructions (Signed)
Use the eardrops daily for one week  Take antibiotics as prescribed  If you develop fever, chills, ear pain, more discharge:  please call the office  If you hearing is not gradually going back to normal within the next 2 or 3 weeks: Please call for an ENT referral.

## 2016-09-26 NOTE — Progress Notes (Signed)
Pre visit review using our clinic review tool, if applicable. No additional management support is needed unless otherwise documented below in the visit note. 

## 2016-09-28 NOTE — Assessment & Plan Note (Signed)
Suppurative  otitis: We'll treat with amoxicillin and ofloxacin. Call if not better particularly if his hearing is not coming back, will need to ENT eval.

## 2016-10-30 DIAGNOSIS — D235 Other benign neoplasm of skin of trunk: Secondary | ICD-10-CM | POA: Diagnosis not present

## 2016-10-30 DIAGNOSIS — L814 Other melanin hyperpigmentation: Secondary | ICD-10-CM | POA: Diagnosis not present

## 2016-10-30 DIAGNOSIS — L821 Other seborrheic keratosis: Secondary | ICD-10-CM | POA: Diagnosis not present

## 2016-10-30 DIAGNOSIS — L309 Dermatitis, unspecified: Secondary | ICD-10-CM | POA: Diagnosis not present

## 2016-10-30 DIAGNOSIS — L570 Actinic keratosis: Secondary | ICD-10-CM | POA: Diagnosis not present

## 2016-11-13 ENCOUNTER — Telehealth: Payer: Self-pay | Admitting: Internal Medicine

## 2016-11-13 NOTE — Telephone Encounter (Signed)
Please advise 

## 2016-11-13 NOTE — Telephone Encounter (Signed)
Patient called stating that he would like to get a referral for an MRI. He states he has a problem in his left leg that he mentioned to Dr. Larose Kells about a year ago, he states that him and Dr. Larose Kells have differing opinions on what it is. He states he thinks it is a lipoma. He states it has gotten a little worse since the last time they spoke about it and he would like to get an MRI to get it checked. Please advise  Phone: [phone]

## 2016-11-13 NOTE — Telephone Encounter (Signed)
Please set up a visit, I need to recheck the area of concern

## 2016-11-13 NOTE — Telephone Encounter (Signed)
Appt scheduled for 11/14/16

## 2016-11-14 ENCOUNTER — Ambulatory Visit (INDEPENDENT_AMBULATORY_CARE_PROVIDER_SITE_OTHER): Payer: Medicare Other | Admitting: Internal Medicine

## 2016-11-14 ENCOUNTER — Encounter: Payer: Self-pay | Admitting: Internal Medicine

## 2016-11-14 VITALS — BP 124/78 | HR 78 | Temp 97.9°F | Resp 12 | Ht 73.0 in | Wt 201.4 lb

## 2016-11-14 DIAGNOSIS — M25552 Pain in left hip: Secondary | ICD-10-CM | POA: Diagnosis not present

## 2016-11-14 NOTE — Progress Notes (Signed)
Subjective:    Patient ID: Brent Coleman, male    DOB: 08-Apr-1941, 76 y.o.   MRN: [id number]  DOS:  11/14/2016 Type of visit - description : acute Interval history: Ill-defined discomfort at the proximal left thigh whenever he flexes his hip. Actually he has seen no lumps, rash. No injury. Was seen 07-2015, at the time symptoms were similar but less noticeable. My impression was a question of meralgia paresthetica    Review of Systems Was seen with a ear infection, all symptoms gone  Past Medical History:  Diagnosis Date  . Basal cell carcinoma    sees derm q year  . 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 Gallstone   . GERD (gastroesophageal reflux disease)   . Hyperlipidemia   . Hypertension   . Malignant neoplasm of prostate [facility]) ~ 2007   sees urology q year    Past Surgical History:  Procedure Laterality Date  . POLYPECTOMY    . TONSILLECTOMY  1949    Social History   Social History  . Marital status: Married    Spouse name: N/A  . Number of children: 2  . Years of education: N/A   Occupational History  . retired--    Social History Main Topics  . Smoking status: Never Smoker  . Smokeless tobacco: Never Used  . Alcohol use Yes  . Drug use: No  . Sexual activity: Yes    Partners: Female   Other Topics Concern  . Not on file   Social History Narrative   HSG. College grad.    Married/ marriage is in good health. 2 Daughters. 2 grandchildren living in [place].    Work - retired from [employer] @ 21 and has enjoyed his retirement.     ACP - discussed and provided living will packet and referred to [email or web address].info/.                     Allergies as of 11/14/2016      Reactions   Doxazosin Mesylate    REACTION: Abdominal pain      Medication List       Accurate as of 11/14/16 11:59 PM. Always use your most recent med list.          aspirin 81 MG tablet Take 81 mg by  mouth daily.   lisinopril 20 MG tablet Commonly known as:  PRINIVIL,ZESTRIL Take 1 tablet (20 mg total) by mouth daily.   multivitamin with minerals tablet Take 1 tablet by mouth daily.   ranitidine 150 MG capsule Commonly known as:  ZANTAC Take 150 mg by mouth every evening.          Objective:   Physical Exam  Musculoskeletal:       Legs:  BP 124/78 (BP Location: Left Arm, Patient Position: Sitting, Cuff Size: Normal)   Pulse 78   Temp 97.9 F (36.6 C) (Oral)   Resp 12   Ht 6\' 1"  (1.854 m)   Wt 201 lb 6 oz (91.3 kg)   SpO2 98%   BMI 26.57 kg/m  General:   Well developed, well nourished . NAD.  HEENT:  Normocephalic . Face symmetric, atraumatic. TMs: Slightly bulge bilaterally, no red, no discharge, few bubbles behind the membrane.  GU: Normal scrotal contents Abdomen: No inguinal hernias. Groins without mass, LAD. Good femoral pulses MSK: Palpation of B thigh at rest and with activity is completely normal except for  a well known large lipoma on the right. Skin: Not pale. Not jaundice Neurologic:  alert & oriented X3.  Speech normal, gait appropriate for age and unassisted Psych--  Cognition and judgment appear intact.  Cooperative with normal attention span and concentration.  Behavior appropriate. No anxious or depressed appearing.      Assessment & Plan:   Assessment HTN Creatinine ~ 1.5 Hyperlipidemia GERD, h/o HH GB stones Prostate cancer, 2007, s/p XRT, Dr Shona Needles Skin cancer, [facility]   Plan: Left groin discomfort: Exam is normal, the patient desires further eval including a MRI. Unclear etiology, could be hip DJD. Refer to also for further eval, MRI? Suppurative otitis : See last visit, now asx, exam is normal except for some congestion.

## 2016-11-14 NOTE — Patient Instructions (Signed)
  GO TO THE FRONT DESK Schedule your next appointment for a  Routine visit  And a medicare wellness by [phone]

## 2016-11-14 NOTE — Progress Notes (Signed)
Pre visit review using our clinic review tool, if applicable. No additional management support is needed unless otherwise documented below in the visit note. 

## 2016-11-15 NOTE — Assessment & Plan Note (Signed)
Left groin discomfort: Exam is normal, the patient desires further eval including a MRI. Unclear etiology, could be hip DJD. Refer to also for further eval, MRI? Suppurative otitis : See last visit, now asx, exam is normal except for some congestion.

## 2016-11-26 ENCOUNTER — Ambulatory Visit (INDEPENDENT_AMBULATORY_CARE_PROVIDER_SITE_OTHER): Payer: Medicare Other | Admitting: Orthopedic Surgery

## 2016-11-26 ENCOUNTER — Ambulatory Visit (INDEPENDENT_AMBULATORY_CARE_PROVIDER_SITE_OTHER): Payer: Medicare Other

## 2016-11-26 ENCOUNTER — Ambulatory Visit (INDEPENDENT_AMBULATORY_CARE_PROVIDER_SITE_OTHER): Payer: Self-pay

## 2016-11-26 ENCOUNTER — Encounter (INDEPENDENT_AMBULATORY_CARE_PROVIDER_SITE_OTHER): Payer: Self-pay | Admitting: Orthopedic Surgery

## 2016-11-26 VITALS — BP 155/77 | HR 75 | Resp 14 | Ht 68.0 in | Wt 200.0 lb

## 2016-11-26 DIAGNOSIS — R1032 Left lower quadrant pain: Secondary | ICD-10-CM

## 2016-11-26 DIAGNOSIS — M25552 Pain in left hip: Secondary | ICD-10-CM

## 2016-11-26 DIAGNOSIS — M79652 Pain in left thigh: Secondary | ICD-10-CM

## 2016-11-26 NOTE — Progress Notes (Signed)
Office Visit Note   Patient: Brent Coleman           Date of Birth: 06-Jul-1941           MRN: [id number] Visit Date: 11/26/2016              Requested by: Brent Branch, MD [address] [place], [place] [address] PCP: Brent November, MD   Assessment & Plan: Visit Diagnoses:  1. Groin pain, left   2. Left thigh pain   3. Possible loose body left hip 4. Rule out labral tear left hip 5. Mild trochanteric bursitis left hip  Plan:  #1: MRI scan of the left hip #2: Follow-up after MRI   Follow-Up Instructions: Return call and schedule appointment after MRI, for review of mri.   Orders:  Orders Placed This Encounter  Procedures  . XR HIP UNILAT W OR W/O PELVIS 2-3 VIEWS LEFT  . XR FEMUR MIN 2 VIEWS LEFT  . Brent Hip Left w/o contrast   No orders of the defined types were placed in this encounter.     Procedures: No procedures performed   Clinical Data: No additional findings.   Subjective: Chief Complaint  Patient presents with  . Left Thigh - Pain    Brent Coleman is a 76 year old male that is experiencing discomfort in his upper left thigh x 4 months. It started in the thigh as a "fullness" or a "pressur e" and today he feels something is "shifting towards his groin area". Noticeable when driving or tying his shoes is when he feels this pressure.. Denies any history of injury or trauma.  Pt hx of Right thigh lipoma 2014. PCP Brent Coleman didn't feel anything with palpation of his proximal thigh.    Review of Systems  Constitutional: Negative.   HENT: Negative.   Respiratory: Negative.   Cardiovascular: Negative.   Gastrointestinal: Negative.   Genitourinary: Negative.   Skin: Negative.   Neurological: Negative.   Hematological: Negative.   Psychiatric/Behavioral: Negative.      Objective: Vital Signs: BP (!) 155/77   Pulse 75   Resp 14   Ht 5\' 8"  (1.727 m)   Wt 200 lb (90.7 kg)   BMI 30.41 kg/m   Physical Exam  Constitutional: He is oriented  to person, place, and time. He appears well-developed and well-nourished.  HENT:  Head: Normocephalic and atraumatic.  Eyes: EOM are normal. Pupils are equal, round, and reactive to light.  Pulmonary/Chest: Effort normal.  Neurological: He is alert and oriented to person, place, and time.  Skin: Skin is warm and dry.  Psychiatric: He has a normal mood and affect. His behavior is normal. Judgment and thought content normal.    Left Hip Exam   Tenderness  The patient is experiencing tenderness in the greater trochanter.  Range of Motion  Flexion: 120  Internal Rotation: 10  External Rotation: 40  Abduction: 40  Adduction: 20   Comments:  He does have some tenderness over the greater trochanteric region which is mild. Pain palpable in the groin. I do not have any masses palpable in the proximal thigh. Negative straight leg raising. In comparison to his right hip he lacks about 10 of external rotation in comparison to the right.      Specialty Comments:  No specialty comments available.  Imaging: Xr Femur Min 2 Views Left  Result Date: 11/26/2016 4 views of the femur did not reveal any bony abnormalities  in the shaft.  Xr Hip Unilat W Or W/o Pelvis 2-3 Views Left  Result Date: 11/26/2016 AP pelvis reveals some cystic changes in the superior acetabulum on the left. Also there appears to be early or some elevation at the superior portion of the fovea    PMFS History: Patient Active Problem List   Diagnosis Date Noted  . Annual physical exam 07/24/2015  . PCP NOTES >>>>>>>>> 07/24/2015  . Lipoma of thigh 11/28/2013  . Chronic renal insufficiency, stage II (mild) 09/13/2013  . Routine general medical examination at a health care facility 05/02/2011  . DIVERTICULOSIS OF Brent 11/30/2009  . PERSONAL HX COLONIC POLYPS 11/30/2009  . CHOLELITHIASIS 11/14/2009  . CARCINOMA, BASAL CELL 10/14/2008  . HYPERLIPIDEMIA 10/14/2008  . NEOPLASM, MALIGNANT, PROSTATE, S/P RADIATION  THERAPY 05/09/2007  . Essential hypertension 05/09/2007   Past Medical History:  Diagnosis Date  . Basal cell carcinoma    sees derm q year  . 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 Gallstone   . GERD (gastroesophageal reflux disease)   . Hyperlipidemia   . Hypertension   . Malignant neoplasm of prostate [facility]) ~ 2007   sees urology q year    Family History  Problem Relation Age of Onset  . Brent cancer Mother 34  . Brent cancer Father 59    Brent  . Hypertension Other   . Stroke Other   . CAD Neg Hx   . Prostate cancer Neg Hx   . Diabetes Neg Hx     Past Surgical History:  Procedure Laterality Date  . POLYPECTOMY    . TONSILLECTOMY  1949   Social History   Occupational History  . retired--    Social History Main Topics  . Smoking status: Never Smoker  . Smokeless tobacco: Never Used  . Alcohol use Yes  . Drug use: No  . Sexual activity: Yes    Partners: Female

## 2016-12-02 ENCOUNTER — Ambulatory Visit
Admission: RE | Admit: 2016-12-02 | Discharge: 2016-12-02 | Disposition: A | Discharge status: Home / Self Care | Payer: Medicare Other | Source: Ambulatory Visit | Attending: Orthopedic Surgery | Admitting: Orthopedic Surgery

## 2016-12-02 DIAGNOSIS — M25552 Pain in left hip: Secondary | ICD-10-CM | POA: Diagnosis not present

## 2016-12-04 ENCOUNTER — Ambulatory Visit (INDEPENDENT_AMBULATORY_CARE_PROVIDER_SITE_OTHER): Payer: Medicare Other | Admitting: Orthopaedic Surgery

## 2016-12-04 ENCOUNTER — Encounter (INDEPENDENT_AMBULATORY_CARE_PROVIDER_SITE_OTHER): Payer: Self-pay | Admitting: Orthopaedic Surgery

## 2016-12-04 VITALS — BP 148/77 | HR 67 | Resp 14 | Ht 74.0 in | Wt 185.0 lb

## 2016-12-04 DIAGNOSIS — M25552 Pain in left hip: Secondary | ICD-10-CM

## 2016-12-04 NOTE — Progress Notes (Signed)
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 Per patient preference no specific treatment at this time. In future consider cortisone injection left hip or evaluate lumbar spine as possible etiology. Mr. Brent Coleman was alleviated to note there wasn't anything serious identified on the MRI scan  Follow-Up Instructions: No Follow-up on file.   Orders:  No orders of the defined types were placed in this encounter.  No orders of the defined types were placed in this encounter.     Procedures: No procedures performed   Clinical Data: No additional findings. See MRI scan report. There is mild osteoarthritis in the posterior aspect of the left hip associated with a labral tear. There is also a nodule in the prostate measuring 16 x 17 mm  Subjective: Chief Complaint  Patient presents with  . Left Hip - Results    Pt here for MRI results of left hip pain  Mr. Brent Coleman was seen by Aaron Edelman last week for evaluation of left anterior thigh and groin pain. It is difficult for him to be specific regarding his pain. He thinks it was related to certain position which she would place his left leg. He does have history of a ruptured disc treated nonoperatively with resolution years ago and doesn't feel like this present discomfort is consistent with a problem with his back. He is not having any numbness or tingling. The pain is localized between the groin and the left knee.  He does have a history of a lipoma in his right thigh. He also has history of prostate cancer treated many years ago with radiation. He is being followed by the urologist on a yearly  basis.  Review of Systems   Objective: Vital Signs: BP (!) 148/77   Pulse 67   Resp 14   Ht 6\' 2"  (1.88 m)   Wt 185 lb (83.9 kg)   BMI 23.75 kg/m   Physical Exam  Ortho Exam no pain to palpation about the left thigh. No effusion left knee. no pain with range of motion left knee. On the extremes of internal/external rotation of his left hip there was some discomfort in his groin and anterior thigh. Straight leg raise negative. Motor exam is intact. No percussible tenderness of his lumbar spine.  Specialty Comments:  No specialty comments available.  Imaging: No results found.   PMFS History: Patient Active Problem List   Diagnosis Date Noted  . Annual physical exam 07/24/2015  . PCP NOTES >>>>>>>>> 07/24/2015  . Lipoma of thigh 11/28/2013  . Chronic renal insufficiency, stage II (mild) 09/13/2013  . Routine general medical examination at a health care facility 05/02/2011  . DIVERTICULOSIS OF COLON 11/30/2009  . PERSONAL HX COLONIC POLYPS 11/30/2009  . CHOLELITHIASIS 11/14/2009  . CARCINOMA, BASAL CELL 10/14/2008  . HYPERLIPIDEMIA 10/14/2008  . NEOPLASM, MALIGNANT, PROSTATE, S/P RADIATION THERAPY 05/09/2007  . Essential hypertension 05/09/2007   Past Medical History:  Diagnosis Date  . Basal cell carcinoma    sees derm q year  .  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 Gallstone   . GERD (gastroesophageal reflux disease)   . Hyperlipidemia   . Hypertension   . Malignant neoplasm of prostate [facility]) ~ 2007   sees urology q year    Family History  Problem Relation Age of Onset  . Colon cancer Mother 33  . Colon cancer Father 40    colon  . Hypertension Other   . Stroke Other   . CAD Neg Hx   . Prostate cancer Neg Hx   . Diabetes Neg Hx     Past Surgical History:  Procedure Laterality Date  . POLYPECTOMY    . TONSILLECTOMY  1949   Social History   Occupational History  . retired--     Social History Main Topics  . Smoking status: Never Smoker  . Smokeless tobacco: Never Used  . Alcohol use Yes  . Drug use: No  . Sexual activity: Yes    Partners: Female

## 2016-12-10 ENCOUNTER — Ambulatory Visit (INDEPENDENT_AMBULATORY_CARE_PROVIDER_SITE_OTHER): Payer: Medicare Other | Admitting: Orthopedic Surgery

## 2016-12-26 ENCOUNTER — Telehealth: Payer: Self-pay | Admitting: Internal Medicine

## 2016-12-26 NOTE — Telephone Encounter (Signed)
Called patient to schedule awv. Patient did not answer. Will try to call patient at a later time.  °

## 2017-01-06 ENCOUNTER — Other Ambulatory Visit: Payer: Self-pay | Admitting: Internal Medicine

## 2017-01-27 DIAGNOSIS — H11001 Unspecified pterygium of right eye: Secondary | ICD-10-CM | POA: Diagnosis not present

## 2017-01-27 DIAGNOSIS — H2513 Age-related nuclear cataract, bilateral: Secondary | ICD-10-CM | POA: Diagnosis not present

## 2017-01-27 DIAGNOSIS — H43813 Vitreous degeneration, bilateral: Secondary | ICD-10-CM | POA: Diagnosis not present

## 2017-01-27 DIAGNOSIS — H35033 Hypertensive retinopathy, bilateral: Secondary | ICD-10-CM | POA: Diagnosis not present

## 2017-02-02 ENCOUNTER — Encounter: Payer: Self-pay | Admitting: Internal Medicine

## 2017-02-02 ENCOUNTER — Ambulatory Visit (INDEPENDENT_AMBULATORY_CARE_PROVIDER_SITE_OTHER): Payer: Medicare Other | Admitting: Internal Medicine

## 2017-02-02 VITALS — BP 126/70 | HR 80 | Temp 97.5°F | Resp 14 | Ht 73.0 in | Wt 200.2 lb

## 2017-02-02 DIAGNOSIS — R1030 Lower abdominal pain, unspecified: Secondary | ICD-10-CM

## 2017-02-02 LAB — POC URINALSYSI DIPSTICK (AUTOMATED)
Bilirubin, UA: NEGATIVE
Blood, UA: NEGATIVE
GLUCOSE UA: NEGATIVE
Ketones, UA: NEGATIVE
Leukocytes, UA: NEGATIVE
NITRITE UA: NEGATIVE
Protein, UA: NEGATIVE
SPEC GRAV UA: 1.015 (ref 1.010–1.025)
UROBILINOGEN UA: 0.2 U/dL
pH, UA: 6 (ref 5.0–8.0)

## 2017-02-02 NOTE — Progress Notes (Signed)
Pre visit review using our clinic review tool, if applicable. No additional management support is needed unless otherwise documented below in the visit note. 

## 2017-02-02 NOTE — Patient Instructions (Signed)
Please call if your stomach problem resurface  You are due for a yearly checkup, please schedule at your convenience

## 2017-02-02 NOTE — Progress Notes (Signed)
Subjective:    Patient ID: Brent Coleman, male    DOB: November 01, 1940, 76 y.o.   MRN: [id number]  DOS:  02/02/2017 Type of visit - description : acute Interval history: About 14 days ago developed ill-defined discomfort at the left lower abdomen, he felt like his abdomen was sore to touch. The area was not swelling, rash-redness of the abdomen. Symptoms self resolved about 4 days ago. Asymptomatic since. He had left groin pain, note from Ortho  reviewed   Review of Systems Denies nausea, vomiting, diarrhea. No constipation . No dysuria, gross hematuria. Denies fever chills. Appetite normal  Past Medical History:  Diagnosis Date  . Basal cell carcinoma    sees derm q year  . 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 Gallstone   . GERD (gastroesophageal reflux disease)   . Hyperlipidemia   . Hypertension   . Malignant neoplasm of prostate [facility]) ~ 2007   sees urology q year    Past Surgical History:  Procedure Laterality Date  . POLYPECTOMY    . TONSILLECTOMY  1949    Social History   Social History  . Marital status: Married    Spouse name: N/A  . Number of children: 2  . Years of education: N/A   Occupational History  . retired--    Social History Main Topics  . Smoking status: Never Smoker  . Smokeless tobacco: Never Used  . Alcohol use Yes  . Drug use: No  . Sexual activity: Yes    Partners: Female   Other Topics Concern  . Not on file   Social History Narrative   HSG. College grad.    Married/ marriage is in good health. 2 Daughters. 2 grandchildren living in [place].    Work - retired from [employer] @ 37 and has enjoyed his retirement.     ACP - discussed and provided living will packet and referred to [email or web address].info/.                     Allergies as of 02/02/2017      Reactions   Doxazosin Mesylate    REACTION: Abdominal pain      Medication List       Accurate  as of 02/02/17 11:59 PM. Always use your most recent med list.          aspirin 81 MG tablet Take 81 mg by mouth daily.   lisinopril 20 MG tablet Commonly known as:  PRINIVIL,ZESTRIL Take 1 tablet (20 mg total) by mouth daily.   multivitamin with minerals tablet Take 1 tablet by mouth daily.   ranitidine 150 MG capsule Commonly known as:  ZANTAC Take 150 mg by mouth every evening.          Objective:   Physical Exam BP 126/70 (BP Location: Left Arm, Patient Position: Sitting, Cuff Size: Normal)   Pulse 80   Temp 97.5 F (36.4 C) (Oral)   Resp 14   Ht 6\' 1"  (1.854 m)   Wt 200 lb 4 oz (90.8 kg)   SpO2 97%   BMI 26.42 kg/m  General:   Well developed, well nourished . NAD.  HEENT:  Normocephalic . Face symmetric, atraumatic Lungs:  CTA B Normal respiratory effort, no intercostal retractions, no accessory muscle use. Heart: RRR,  no murmur.  no pretibial edema bilaterally  Abdomen:  Not distended, soft, non-tender. No rebound or rigidity.  Skin: Not pale. Not  jaundice Neurologic:  alert & oriented X3.  Speech normal, gait appropriate for age and unassisted Psych--  Cognition and judgment appear intact.  Cooperative with normal attention span and concentration.  Behavior appropriate. No anxious or depressed appearing.    Assessment & Plan:   Assessment HTN Creatinine ~ 1.5 Hyperlipidemia GERD, h/o HH GB stones Prostate cancer, 2007, s/p XRT, Dr Shona Needles Skin cancer, [facility]   PLAN LLQ pain: Resolved, exam benign, check a UA, resolved diverticulitis?Marland Kitchen Rx observation, call if sx resurface Left groin discomfort: Had a MRI, had a degeneration of the labrum. Saw orthopedics, they agreed on observation. Due for a yearly checkup, at his convenience.

## 2017-02-03 NOTE — Assessment & Plan Note (Signed)
LLQ pain: Resolved, exam benign, check a UA, resolved diverticulitis?Marland Kitchen Rx observation, call if sx resurface Left groin discomfort: Had a MRI, had a degeneration of the labrum. Saw orthopedics, they agreed on observation. Due for a yearly checkup, at his convenience.

## 2017-02-20 NOTE — Telephone Encounter (Signed)
Patient declines AWV.  

## 2017-03-18 ENCOUNTER — Ambulatory Visit (INDEPENDENT_AMBULATORY_CARE_PROVIDER_SITE_OTHER): Payer: Medicare Other | Admitting: Internal Medicine

## 2017-03-18 ENCOUNTER — Encounter: Payer: Self-pay | Admitting: Internal Medicine

## 2017-03-18 VITALS — BP 132/80 | HR 57 | Temp 98.0°F | Resp 14 | Ht 73.0 in | Wt 197.0 lb

## 2017-03-18 DIAGNOSIS — K1120 Sialoadenitis, unspecified: Secondary | ICD-10-CM

## 2017-03-18 MED ORDER — AMOXICILLIN-POT CLAVULANATE 875-125 MG PO TABS: 1.0000 | ORAL_TABLET | Freq: Two times a day (BID) | ORAL | 0 refills | Status: DC

## 2017-03-18 NOTE — Progress Notes (Signed)
Subjective:    Patient ID: Brent Coleman, male    DOB: 1941/09/29, 76 y.o.   MRN: [id number]  DOS:  03/18/2017 Type of visit - description : Acute visit Interval history: 5 days history of pain, located at the posterior, left side of the tongue. Worse when he chews. He denies runny nose, sore throat, cough, sneezing. Denies ear pain per se. No headaches, no visual disturbances.    Review of Systems   Past Medical History:  Diagnosis Date  . Basal cell carcinoma    sees derm q year  . 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 Gallstone   . GERD (gastroesophageal reflux disease)   . Hyperlipidemia   . Hypertension   . Malignant neoplasm of prostate [facility]) ~ 2007   sees urology q year    Past Surgical History:  Procedure Laterality Date  . POLYPECTOMY    . TONSILLECTOMY  1949    Social History   Social History  . Marital status: Married    Spouse name: N/A  . Number of children: 2  . Years of education: N/A   Occupational History  . retired--    Social History Main Topics  . Smoking status: Never Smoker  . Smokeless tobacco: Never Used  . Alcohol use Yes  . Drug use: No  . Sexual activity: Yes    Partners: Female   Other Topics Concern  . Not on file   Social History Narrative   HSG. College grad.    Married/ marriage is in good health. 2 Daughters. 2 grandchildren living in [place].    Work - retired from [employer] @ 15 and has enjoyed his retirement.     ACP - discussed and provided living will packet and referred to [email or web address].info/.                     Allergies as of 03/18/2017      Reactions   Doxazosin Mesylate    REACTION: Abdominal pain      Medication List       Accurate as of 03/18/17 11:59 PM. Always use your most recent med list.          amoxicillin-clavulanate 875-125 MG tablet Commonly known as:  AUGMENTIN Take 1 tablet by mouth 2 (two) times  daily.   aspirin 81 MG tablet Take 81 mg by mouth daily.   lisinopril 20 MG tablet Commonly known as:  PRINIVIL,ZESTRIL Take 1 tablet (20 mg total) by mouth daily.   multivitamin with minerals tablet Take 1 tablet by mouth daily.   ranitidine 150 MG capsule Commonly known as:  ZANTAC Take 150 mg by mouth every evening.          Objective:   Physical Exam BP 132/80 (BP Location: Left Arm, Patient Position: Sitting, Cuff Size: Normal)   Pulse (!) 57   Temp 98 F (36.7 C) (Oral) Comment (Src): o  Resp 14   Ht 6\' 1"  (1.854 m)   Wt 197 lb (89.4 kg)   SpO2 98%   BMI 25.99 kg/m  General:   Well developed, well nourished . NAD.  HEENT:  TMJ, no click or pain.  Normocephalic . Face symmetric, atraumatic. TMs: Air-fluid levels in both, no redness or bulging. Negative trago sign. Oral exam: Tonge normal to inspection and palpation, teeth not tender to percussion. Gums are nontender. When I palpated the floor of the mouth, I noted a  minor salivary gland at the left side to be slightly larger compared to the right and definitely TTP. It didn't feel warm. Neck: No thyromegaly, no LADs, no supraclavicular mass. Temples: Good TA pulses bilaterally without TTP.  Skin: Not pale. Not jaundice Neurologic:  alert & oriented X3.  Speech normal, gait appropriate for age and unassisted Psych--  Cognition and judgment appear intact.  behavior appropriate. No anxious or depressed appearing.      Assessment & Plan:   Assessment HTN Creatinine ~ 1.5 Hyperlipidemia GERD, h/o HH GB stones Prostate cancer, 2007, s/p XRT, Dr Shona Needles Skin cancer, [facility]   PLAN Minor salivary gland inflammation/infex versus a small stone. The patient presents with pain at the base of the tongue, left side,  is slightly TTP at the area were I expect a salivary gland  TMJ no click or pain . On clinical grounds, no evidence of TA or TMJ. Plan: Augmentin 5 days, suck on a lime-lemon, nsaids prn (gi  precautions discussed), call if no better (ENT referral) or it worse

## 2017-03-18 NOTE — Progress Notes (Signed)
Pre visit review using our clinic review tool, if applicable. No additional management support is needed unless otherwise documented below in the visit note. 

## 2017-03-18 NOTE — Patient Instructions (Signed)
Take the antibiotic as prescribed  Take Motrin 200 mg OTC one or 2 twice a day as needed only. Take it with food  Suck on a lime or lemon twice a day  Call next week for a ENT referral if you're not improving.  Call anytime if you have severe symptoms.

## 2017-03-19 NOTE — Assessment & Plan Note (Signed)
Minor salivary gland inflammation/infex versus a small stone. The patient presents with pain at the base of the tongue, left side,  is slightly TTP at the area were I expect a salivary gland  TMJ no click or pain . On clinical grounds, no evidence of TA or TMJ. Plan: Augmentin 5 days, suck on a lime-lemon, nsaids prn (gi precautions discussed), call if no better (ENT referral) or it worse

## 2017-03-24 ENCOUNTER — Other Ambulatory Visit: Payer: Self-pay | Admitting: Internal Medicine

## 2017-04-13 ENCOUNTER — Other Ambulatory Visit: Payer: Self-pay | Admitting: Dermatology

## 2017-04-13 DIAGNOSIS — C44219 Basal cell carcinoma of skin of left ear and external auricular canal: Secondary | ICD-10-CM | POA: Diagnosis not present

## 2017-08-19 DIAGNOSIS — L821 Other seborrheic keratosis: Secondary | ICD-10-CM | POA: Diagnosis not present

## 2017-09-09 DIAGNOSIS — C61 Malignant neoplasm of prostate: Secondary | ICD-10-CM | POA: Diagnosis not present

## 2017-09-09 LAB — PSA: PSA: 0.28

## 2017-09-13 ENCOUNTER — Other Ambulatory Visit: Payer: Self-pay | Admitting: Internal Medicine

## 2017-09-14 ENCOUNTER — Encounter: Payer: Self-pay | Admitting: Internal Medicine

## 2017-09-16 DIAGNOSIS — C61 Malignant neoplasm of prostate: Secondary | ICD-10-CM | POA: Diagnosis not present

## 2017-09-23 ENCOUNTER — Encounter: Payer: Self-pay | Admitting: Internal Medicine

## 2017-10-19 ENCOUNTER — Encounter: Payer: Self-pay | Admitting: Internal Medicine

## 2017-10-19 ENCOUNTER — Ambulatory Visit (INDEPENDENT_AMBULATORY_CARE_PROVIDER_SITE_OTHER): Payer: Medicare Other | Admitting: Internal Medicine

## 2017-10-19 ENCOUNTER — Other Ambulatory Visit: Payer: Self-pay

## 2017-10-19 VITALS — BP 128/70 | HR 74 | Temp 98.2°F | Resp 14 | Ht 73.0 in | Wt 201.2 lb

## 2017-10-19 DIAGNOSIS — I10 Essential (primary) hypertension: Secondary | ICD-10-CM | POA: Diagnosis not present

## 2017-10-19 DIAGNOSIS — Z23 Encounter for immunization: Secondary | ICD-10-CM | POA: Diagnosis not present

## 2017-10-19 DIAGNOSIS — E785 Hyperlipidemia, unspecified: Secondary | ICD-10-CM

## 2017-10-19 DIAGNOSIS — K219 Gastro-esophageal reflux disease without esophagitis: Secondary | ICD-10-CM

## 2017-10-19 DIAGNOSIS — Z8546 Personal history of malignant neoplasm of prostate: Secondary | ICD-10-CM | POA: Diagnosis not present

## 2017-10-19 NOTE — Assessment & Plan Note (Signed)
-   Td booster 10-2017; Pneumonia shot 2012; Prevnar --  4-17;  Zostavax 2009; Declined had a flu shot --CCS: Cscope  2012, Cscope  06-2016, 2 polyps.  Recall 5 years however due to age GI left that up to the patient and me. -- Prostate cancer, 2007, s/p XRT, saw Dr Shona Needles 09-2017, PSA was 0.28, was RX to [facility] prn, PCP to check PSA UA q year - Continue aspirin 81 -labs: CMP, FLP, CBC, TSH -Diet is okay, he is very active, bike rider, does more than 1000 miles a year

## 2017-10-19 NOTE — Patient Instructions (Signed)
GO TO THE LAB : Get the blood work     GO TO THE FRONT DESK  Schedule your next appointment for a checkup back in December 2019   Check the  blood pressure 2 or 3 times a month  Be sure your blood pressure is between 110/65 and  135/85. If it is consistently higher or lower, let me know   Consider a Medicare wellness exam with one of our nurses

## 2017-10-19 NOTE — Progress Notes (Signed)
Subjective:    Patient ID: Brent Coleman, male    DOB: 1940-12-13, 77 y.o.   MRN: [id number]  DOS:  10/19/2017 Type of visit - description : Routine checkup Interval history: HTN: Good compliance with medication, amb BPs 130, 140. GERD: Good compliance of medication, rarely has breakthrough heartburn. Prostate cancer, history of: Note from urology reviewed. Skin cancer, history of : Per dermatology.  Review of Systems Denies chest pain or difficulty breathing No nausea, vomiting, blood in the stools No dysphagia or odynophagia He remains extremely active bike riding.  Past Medical History:  Diagnosis Date  . Basal cell carcinoma    sees derm q year  . 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 Gallstone   . GERD (gastroesophageal reflux disease)   . Hyperlipidemia   . Hypertension   . Malignant neoplasm of prostate [facility]) ~ 2007   sees urology q year    Past Surgical History:  Procedure Laterality Date  . POLYPECTOMY    . TONSILLECTOMY  1949    Social History   Socioeconomic History  . Marital status: Married    Spouse name: Not on file  . Number of children: 2  . Years of education: Not on file  . Highest education level: Not on file  Social Needs  . Financial resource strain: Not on file  . Food insecurity - worry: Not on file  . Food insecurity - inability: Not on file  . Transportation needs - medical: Not on file  . Transportation needs - non-medical: Not on file  Occupational History  . Occupation: retired--  Tobacco Use  . Smoking status: Never Smoker  . Smokeless tobacco: Never Used  Substance and Sexual Activity  . Alcohol use: Yes  . Drug use: No  . Sexual activity: Yes    Partners: Female  Other Topics Concern  . Not on file  Social History Narrative   HSG. College grad.    Married/ marriage is in good health. 2 Daughters. 2 grandchildren living in [place].    Work - retired  from [employer] @ 16 and has enjoyed his retirement.            Allergies as of 10/19/2017      Reactions   Doxazosin Mesylate    REACTION: Abdominal pain      Medication List        Accurate as of 10/19/17 11:59 PM. Always use your most recent med list.          aspirin 81 MG tablet Take 81 mg by mouth daily.   lisinopril 20 MG tablet Commonly known as:  PRINIVIL,ZESTRIL TAKE 1 TABLET(20 MG) BY MOUTH DAILY   multivitamin with minerals tablet Take 1 tablet by mouth daily.   ranitidine 150 MG capsule Commonly known as:  ZANTAC Take 150 mg by mouth every evening.          Objective:   Physical Exam BP 128/70 (BP Location: Left Arm, Patient Position: Sitting, Cuff Size: Normal)   Pulse 74   Temp 98.2 F (36.8 C) (Oral)   Resp 14   Ht 6\' 1"  (1.854 m)   Wt 201 lb 4 oz (91.3 kg)   SpO2 95%   BMI 26.55 kg/m  General:   Well developed, well nourished . NAD.  Neck: No  thyromegaly  HEENT:  Normocephalic . Face symmetric, atraumatic Lungs:  CTA B Normal respiratory effort, no intercostal retractions, no accessory  muscle use. Heart: RRR,  no murmur.  No pretibial edema bilaterally  Abdomen:  Not distended, soft, non-tender. No rebound or rigidity.   Skin: Exposed areas without rash. Not pale. Not jaundice Neurologic:  alert & oriented X3.  Speech normal, gait appropriate for age and unassisted Strength symmetric and appropriate for age.  Psych: Cognition and judgment appear intact.  Cooperative with normal attention span and concentration.  Behavior appropriate. No anxious or depressed appearing.     Assessment & Plan:   Assessment HTN Creatinine ~ 1.5 Hyperlipidemia GERD, h/o HH GB stones Prostate cancer, 2007, s/p XRT, saw Dr Shona Needles 09-2017, PSA was 0.28, was RX to RTC prn, PCP to check PSA UA q year Skin cancer, [facility]. Sees derm  PLAN HTN: Seems well controlled on lisinopril, checking labs Hyperlipidemia: Diet controlled check a FLP GERD: On  Zantac, occasionally breakthrough sxs but no dysphagia or odynophagia.  No change. Prostate cancer: Note from urology reviewed.  He will go there on prn basis, will check a PSA UA yearly here. RTC 09/2018

## 2017-10-19 NOTE — Progress Notes (Signed)
Pre visit review using our clinic review tool, if applicable. No additional management support is needed unless otherwise documented below in the visit note. 

## 2017-10-20 DIAGNOSIS — K219 Gastro-esophageal reflux disease without esophagitis: Secondary | ICD-10-CM | POA: Insufficient documentation

## 2017-10-20 LAB — CBC WITH DIFFERENTIAL/PLATELET
BASOS PCT: 0.9 % (ref 0.0–3.0)
Basophils Absolute: 0.1 10*3/uL (ref 0.0–0.1)
EOS PCT: 1.4 % (ref 0.0–5.0)
Eosinophils Absolute: 0.1 10*3/uL (ref 0.0–0.7)
HEMATOCRIT: 48.1 % (ref 39.0–52.0)
HEMOGLOBIN: 16.8 g/dL (ref 13.0–17.0)
LYMPHS PCT: 26.1 % (ref 12.0–46.0)
Lymphs Abs: 1.5 10*3/uL (ref 0.7–4.0)
MCHC: 34.9 g/dL (ref 30.0–36.0)
MCV: 98.8 fl (ref 78.0–100.0)
MONOS PCT: 8.5 % (ref 3.0–12.0)
Monocytes Absolute: 0.5 10*3/uL (ref 0.1–1.0)
NEUTROS ABS: 3.7 10*3/uL (ref 1.4–7.7)
Neutrophils Relative %: 63.1 % (ref 43.0–77.0)
PLATELETS: 174 10*3/uL (ref 150.0–400.0)
RBC: 4.87 Mil/uL (ref 4.22–5.81)
RDW: 12.4 % (ref 11.5–15.5)
WBC: 5.9 10*3/uL (ref 4.0–10.5)

## 2017-10-20 LAB — COMPREHENSIVE METABOLIC PANEL
ALT: 19 U/L (ref 0–53)
AST: 17 U/L (ref 0–37)
Albumin: 4.1 g/dL (ref 3.5–5.2)
Alkaline Phosphatase: 63 U/L (ref 39–117)
BILIRUBIN TOTAL: 1 mg/dL (ref 0.2–1.2)
BUN: 24 mg/dL — AB (ref 6–23)
CALCIUM: 8.9 mg/dL (ref 8.4–10.5)
CO2: 28 meq/L (ref 19–32)
CREATININE: 1.43 mg/dL (ref 0.40–1.50)
Chloride: 103 mEq/L (ref 96–112)
GFR: 50.72 mL/min — ABNORMAL LOW (ref >60.00)
GLUCOSE: 95 mg/dL (ref 70–99)
Potassium: 4.3 mEq/L (ref 3.5–5.1)
SODIUM: 137 meq/L (ref 135–145)
Total Protein: 6.5 g/dL (ref 6.0–8.3)

## 2017-10-20 LAB — LDL CHOLESTEROL, DIRECT: LDL DIRECT: 119 mg/dL

## 2017-10-20 LAB — LIPID PANEL
Cholesterol: 207 mg/dL — ABNORMAL HIGH (ref 0–200)
HDL: 32.4 mg/dL — ABNORMAL LOW (ref >39.00)
Total CHOL/HDL Ratio: 6

## 2017-10-20 LAB — TSH: TSH: 1.73 u[IU]/mL (ref 0.35–4.50)

## 2017-10-20 NOTE — Assessment & Plan Note (Signed)
HTN: Seems well controlled on lisinopril, checking labs Hyperlipidemia: Diet controlled check a FLP GERD: On Zantac, occasionally breakthrough sxs but no dysphagia or odynophagia.  No change. Prostate cancer: Note from urology reviewed.  He will go there on prn basis, will check a PSA UA yearly here. RTC 09/2018

## 2017-12-07 ENCOUNTER — Other Ambulatory Visit: Payer: Self-pay | Admitting: Internal Medicine

## 2018-05-05 ENCOUNTER — Other Ambulatory Visit: Payer: Self-pay

## 2018-05-07 ENCOUNTER — Other Ambulatory Visit (INDEPENDENT_AMBULATORY_CARE_PROVIDER_SITE_OTHER): Payer: Medicare Other

## 2018-05-07 ENCOUNTER — Telehealth: Payer: Self-pay

## 2018-05-07 DIAGNOSIS — I10 Essential (primary) hypertension: Secondary | ICD-10-CM

## 2018-05-07 DIAGNOSIS — E785 Hyperlipidemia, unspecified: Secondary | ICD-10-CM

## 2018-05-07 LAB — COMPREHENSIVE METABOLIC PANEL
ALBUMIN: 4.3 g/dL (ref 3.5–5.2)
ALT: 17 U/L (ref 0–53)
AST: 15 U/L (ref 0–37)
Alkaline Phosphatase: 68 U/L (ref 39–117)
BUN: 23 mg/dL (ref 6–23)
CHLORIDE: 103 meq/L (ref 96–112)
CO2: 30 meq/L (ref 19–32)
CREATININE: 1.44 mg/dL (ref 0.40–1.50)
Calcium: 9.2 mg/dL (ref 8.4–10.5)
GFR: 50.24 mL/min — ABNORMAL LOW (ref >60.00)
Glucose, Bld: 103 mg/dL — ABNORMAL HIGH (ref 70–99)
POTASSIUM: 4.7 meq/L (ref 3.5–5.1)
SODIUM: 139 meq/L (ref 135–145)
Total Bilirubin: 1.5 mg/dL — ABNORMAL HIGH (ref 0.2–1.2)
Total Protein: 6.2 g/dL (ref 6.0–8.3)

## 2018-05-07 LAB — LIPID PANEL
CHOL/HDL RATIO: 6
CHOLESTEROL: 224 mg/dL — AB (ref 0–200)
HDL: 38.4 mg/dL — ABNORMAL LOW (ref >39.00)
LDL CALC: 155 mg/dL — AB (ref 0–99)
NonHDL: 185.8
TRIGLYCERIDES: 156 mg/dL — AB (ref 0.0–149.0)
VLDL: 31.2 mg/dL (ref 0.0–40.0)

## 2018-05-07 LAB — CBC WITH DIFFERENTIAL/PLATELET
BASOS PCT: 0.6 % (ref 0.0–3.0)
Basophils Absolute: 0 10*3/uL (ref 0.0–0.1)
EOS ABS: 0.1 10*3/uL (ref 0.0–0.7)
EOS PCT: 2.6 % (ref 0.0–5.0)
HCT: 46.4 % (ref 39.0–52.0)
HEMOGLOBIN: 16.5 g/dL (ref 13.0–17.0)
LYMPHS ABS: 1.5 10*3/uL (ref 0.7–4.0)
Lymphocytes Relative: 32.6 % (ref 12.0–46.0)
MCHC: 35.5 g/dL (ref 30.0–36.0)
MCV: 96.9 fl (ref 78.0–100.0)
MONO ABS: 0.4 10*3/uL (ref 0.1–1.0)
Monocytes Relative: 8 % (ref 3.0–12.0)
NEUTROS ABS: 2.5 10*3/uL (ref 1.4–7.7)
Neutrophils Relative %: 56.2 % (ref 43.0–77.0)
PLATELETS: 140 10*3/uL — AB (ref 150.0–400.0)
RBC: 4.79 Mil/uL (ref 4.22–5.81)
RDW: 12.5 % (ref 11.5–15.5)
WBC: 4.5 10*3/uL (ref 4.0–10.5)

## 2018-05-07 LAB — TSH: TSH: 1.85 u[IU]/mL (ref 0.35–4.50)

## 2018-05-07 NOTE — Telephone Encounter (Signed)
Copied from [place] [id number]. Topic: General - Other >> May 07, 2018  9:07 AM Oneta Rack wrote: Relation to pt: self  Call back number: [phone]  Reason for call:  Patient would like to come in within the next 15 minutes and have labs drawn. As per 10/19/17 lab notes "Please come back in 6 months, fasting, for a recheck of your blood. You won't need to see me that day. Call if you have questions. Other labs are very good." chart doesn't reflect orders, please advise as soon as you can.

## 2018-05-07 NOTE — Telephone Encounter (Signed)
Please inform Pt that orders have been placed and schedule FASTING lab appt at his convenience. Thank you.

## 2018-08-11 DIAGNOSIS — H2511 Age-related nuclear cataract, right eye: Secondary | ICD-10-CM | POA: Diagnosis not present

## 2018-08-11 DIAGNOSIS — H11001 Unspecified pterygium of right eye: Secondary | ICD-10-CM | POA: Diagnosis not present

## 2018-08-11 DIAGNOSIS — H524 Presbyopia: Secondary | ICD-10-CM | POA: Diagnosis not present

## 2018-08-11 DIAGNOSIS — H2512 Age-related nuclear cataract, left eye: Secondary | ICD-10-CM | POA: Diagnosis not present

## 2018-08-11 DIAGNOSIS — H40023 Open angle with borderline findings, high risk, bilateral: Secondary | ICD-10-CM | POA: Diagnosis not present

## 2018-08-11 DIAGNOSIS — H5203 Hypermetropia, bilateral: Secondary | ICD-10-CM | POA: Diagnosis not present

## 2018-08-23 ENCOUNTER — Ambulatory Visit (INDEPENDENT_AMBULATORY_CARE_PROVIDER_SITE_OTHER): Payer: Medicare Other

## 2018-08-23 DIAGNOSIS — Z23 Encounter for immunization: Secondary | ICD-10-CM

## 2018-08-25 ENCOUNTER — Other Ambulatory Visit: Payer: Self-pay

## 2018-09-14 DIAGNOSIS — L812 Freckles: Secondary | ICD-10-CM | POA: Diagnosis not present

## 2018-09-14 DIAGNOSIS — C44219 Basal cell carcinoma of skin of left ear and external auricular canal: Secondary | ICD-10-CM | POA: Diagnosis not present

## 2018-09-14 DIAGNOSIS — D0471 Carcinoma in situ of skin of right lower limb, including hip: Secondary | ICD-10-CM | POA: Diagnosis not present

## 2018-09-14 DIAGNOSIS — L821 Other seborrheic keratosis: Secondary | ICD-10-CM | POA: Diagnosis not present

## 2018-09-14 DIAGNOSIS — D485 Neoplasm of uncertain behavior of skin: Secondary | ICD-10-CM | POA: Diagnosis not present

## 2018-09-14 DIAGNOSIS — D0461 Carcinoma in situ of skin of right upper limb, including shoulder: Secondary | ICD-10-CM | POA: Diagnosis not present

## 2018-09-14 DIAGNOSIS — L570 Actinic keratosis: Secondary | ICD-10-CM | POA: Diagnosis not present

## 2018-09-14 DIAGNOSIS — Z85828 Personal history of other malignant neoplasm of skin: Secondary | ICD-10-CM | POA: Diagnosis not present

## 2018-10-20 DIAGNOSIS — C44219 Basal cell carcinoma of skin of left ear and external auricular canal: Secondary | ICD-10-CM | POA: Diagnosis not present

## 2018-10-20 DIAGNOSIS — Z85828 Personal history of other malignant neoplasm of skin: Secondary | ICD-10-CM | POA: Diagnosis not present

## 2018-12-08 ENCOUNTER — Other Ambulatory Visit: Payer: Self-pay | Admitting: Internal Medicine

## 2019-01-12 ENCOUNTER — Other Ambulatory Visit: Payer: Self-pay | Admitting: Internal Medicine

## 2019-01-12 NOTE — Telephone Encounter (Signed)
Requested medication (s) are due for refill today: Yes  Requested medication (s) are on the active medication list: Yes  Last refill:  12/08/18  Future visit scheduled: No  Notes to clinic:  Courtesy refill already given, appointment needed     Requested Prescriptions  Pending Prescriptions Disp Refills   lisinopril (PRINIVIL,ZESTRIL) 20 MG tablet 90 tablet 0    Sig: Take 1 tablet (20 mg total) by mouth daily.     Cardiovascular:  ACE Inhibitors Failed - 01/12/2019  4:01 PM      Failed - Cr in normal range and within 180 days    Creatinine, Ser  Date Value Ref Range Status  05/07/2018 1.44 0.40 - 1.50 mg/dL Final         Failed - K in normal range and within 180 days    Potassium  Date Value Ref Range Status  05/07/2018 4.7 3.5 - 5.1 mEq/L Final         Failed - Valid encounter within last 6 months    Recent Outpatient Visits          1 year ago Essential hypertension   [facility] [place], [place]   1 year ago Salivary gland adenitis   [facility] [place], [place]   1 year ago Lower abdominal pain   [facility] [place], [place]   2 years ago Left hip pain   [facility] [place], [place]   2 years ago Acute suppurative otitis media of left ear with spontaneous rupture of tympanic membrane, recurrence not specified   [facility] [place], [place]             Passed - Patient is not pregnant      Passed - Last BP in normal range    BP Readings from Last 1 Encounters:  10/19/17 128/70

## 2019-01-13 MED ORDER — LISINOPRIL 20 MG PO TABS: 20.0000 mg | ORAL_TABLET | Freq: Every day | ORAL | 0 refills | Status: DC

## 2019-01-17 ENCOUNTER — Ambulatory Visit (INDEPENDENT_AMBULATORY_CARE_PROVIDER_SITE_OTHER): Payer: Medicare Other | Admitting: Internal Medicine

## 2019-01-17 ENCOUNTER — Other Ambulatory Visit: Payer: Self-pay

## 2019-01-17 DIAGNOSIS — I10 Essential (primary) hypertension: Secondary | ICD-10-CM

## 2019-01-17 DIAGNOSIS — C4491 Basal cell carcinoma of skin, unspecified: Secondary | ICD-10-CM

## 2019-01-17 DIAGNOSIS — E785 Hyperlipidemia, unspecified: Secondary | ICD-10-CM

## 2019-01-17 NOTE — Progress Notes (Signed)
Subjective:    Patient ID: Brent Coleman, male    DOB: 12-Apr-1941, 78 y.o.   MRN: [id number]  DOS:  01/17/2019 Type of visit - description: Virtual Visit via Video Note  I connected with@ on 01/17/19 at 10:20 AM EDT by a video enabled telemedicine application and verified that I am speaking with the correct person using two identifiers.   THIS ENCOUNTER IS A VIRTUAL VISIT DUE TO COVID-19 - PATIENT WAS NOT SEEN IN THE OFFICE. PATIENT HAS CONSENTED TO VIRTUAL VISIT / TELEMEDICINE VISIT   Location of patient: home  Location of provider: office  I discussed the limitations of evaluation and management by telemedicine and the availability of in person appointments. The patient expressed understanding and agreed to proceed.  History of Present Illness: ROV Since the last office visit, he is doing well. We reviewed his medications and labs. Good compliance with meds without apparent side effects. He is following all the CDC guidelines regards coronavirus    Review of Systems No fever chills No chest pain no difficulty breathing No nausea, vomiting, diarrhea. Mild LUTS, at baseline  Past Medical History:  Diagnosis Date  . Basal cell carcinoma    sees derm q year  . 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 Gallstone   . GERD (gastroesophageal reflux disease)   . Hyperlipidemia   . Hypertension   . Malignant neoplasm of prostate [facility]) ~ 2007   sees urology q year    Past Surgical History:  Procedure Laterality Date  . POLYPECTOMY    . TONSILLECTOMY  1949    Social History   Socioeconomic History  . Marital status: Married    Spouse name: Not on file  . Number of children: 2  . Years of education: Not on file  . Highest education level: Not on file  Occupational History  . Occupation: retired--  Scientific laboratory technician  . Financial resource strain: Not on file  . Food insecurity:    Worry: Not on file   Inability: Not on file  . Transportation needs:    Medical: Not on file    Non-medical: Not on file  Tobacco Use  . Smoking status: Never Smoker  . Smokeless tobacco: Never Used  Substance and Sexual Activity  . Alcohol use: Yes  . Drug use: No  . Sexual activity: Yes    Partners: Female  Lifestyle  . Physical activity:    Days per week: Not on file    Minutes per session: Not on file  . Stress: Not on file  Relationships  . Social connections:    Talks on phone: Not on file    Gets together: Not on file    Attends religious service: Not on file    Active member of club or organization: Not on file    Attends meetings of clubs or organizations: Not on file    Relationship status: Not on file  . Intimate partner violence:    Fear of current or ex partner: Not on file    Emotionally abused: Not on file    Physically abused: Not on file    Forced sexual activity: Not on file  Other Topics Concern  . Not on file  Social History Narrative   HSG. College grad.    Married/ marriage is in good health. 2 Daughters. 2 grandchildren living in [place].    Work - retired from [employer] @ 86 and has enjoyed  his retirement.            Allergies as of 01/17/2019      Reactions   Doxazosin Mesylate    REACTION: Abdominal pain      Medication List       Accurate as of January 17, 2019 10:19 AM. Always use your most recent med list.        aspirin 81 MG tablet Take 81 mg by mouth daily.   lisinopril 20 MG tablet Commonly known as:  PRINIVIL,ZESTRIL Take 1 tablet (20 mg total) by mouth daily.   multivitamin with minerals tablet Take 1 tablet by mouth daily.   ranitidine 150 MG capsule Commonly known as:  ZANTAC Take 150 mg by mouth every evening.           Objective:   Physical Exam There were no vitals taken for this visit. This was a video conference, he is alert oriented x3 in no apparent distress    Assessment     Assessment HTN Creatinine ~ 1.5  Hyperlipidemia GERD, h/o HH GB stones Prostate cancer, 2007, s/p XRT, saw Dr Shona Needles 09-2017, PSA was 0.28, was RX to RTC prn, PCP to check PSA UA q year Skin cancer, BCC. Sees derm  PLAN HTN: Currently on losartan, ambulatory BPs were check 3 days in preparation for this visit and they are very good, 123/78, 133/71.  Last BMP is stable and satisfactory.  No change Hyperlipidemia: Labs reviewed, TG are very good, LDL slightly increased, recheck on RTC Skin cancer: Since the last visit, underwent mohs' surgery @ left ear. COVID-19 education: Fortunately, patient is following all the appropriate recommendations for prevention.  He is doing well. RTC: 4 months, hopefully face-to-face fasting.  Patient will call in 3 months and set the appointment. Encouraged to call sooner than 4 months if needed.   I discussed the assessment and treatment plan with the patient. The patient was provided an opportunity to ask questions and all were answered. The patient agreed with the plan and demonstrated an understanding of the instructions.   The patient was advised to call back or seek an in-person evaluation if the symptoms worsen or if the condition fails to improve as anticipated.

## 2019-01-18 NOTE — Assessment & Plan Note (Signed)
HTN: Currently on losartan, ambulatory BPs were check 3 days in preparation for this visit and they are very good, 123/78, 133/71.  Last BMP is stable and satisfactory.  No change Hyperlipidemia: Labs reviewed, TG are very good, LDL slightly increased, recheck on RTC Skin cancer: Since the last visit, underwent mohs' surgery @ left ear. COVID-19 education: Fortunately, patient is following all the appropriate recommendations for prevention.  He is doing well. RTC: 4 months, hopefully face-to-face fasting.  Patient will call in 3 months and set the appointment. Encouraged to call sooner than 4 months if needed.

## 2019-02-10 ENCOUNTER — Other Ambulatory Visit: Payer: Self-pay | Admitting: Internal Medicine

## 2019-02-18 ENCOUNTER — Encounter: Payer: Self-pay | Admitting: Internal Medicine

## 2019-03-19 ENCOUNTER — Emergency Department (HOSPITAL_COMMUNITY): Payer: Medicare Other

## 2019-03-19 ENCOUNTER — Inpatient Hospital Stay (HOSPITAL_COMMUNITY)
Admission: EM | Admit: 2019-03-19 | Discharge: 2019-03-20 | DRG: 071 | Disposition: A | Discharge status: Home / Self Care | Payer: Medicare Other | Attending: Internal Medicine | Admitting: Internal Medicine

## 2019-03-19 ENCOUNTER — Other Ambulatory Visit: Payer: Self-pay

## 2019-03-19 ENCOUNTER — Encounter (HOSPITAL_COMMUNITY): Payer: Self-pay

## 2019-03-19 ENCOUNTER — Ambulatory Visit: Admit: 2019-03-19 | Discharge: 2019-03-20 | Payer: MEDICARE

## 2019-03-19 DIAGNOSIS — Z85828 Personal history of other malignant neoplasm of skin: Secondary | ICD-10-CM | POA: Diagnosis not present

## 2019-03-19 DIAGNOSIS — Z800 Family history of malignant neoplasm of digestive organs: Secondary | ICD-10-CM

## 2019-03-19 DIAGNOSIS — Z79899 Other long term (current) drug therapy: Secondary | ICD-10-CM | POA: Diagnosis not present

## 2019-03-19 DIAGNOSIS — Z7982 Long term (current) use of aspirin: Secondary | ICD-10-CM

## 2019-03-19 DIAGNOSIS — R55 Syncope and collapse: Secondary | ICD-10-CM | POA: Diagnosis not present

## 2019-03-19 DIAGNOSIS — M5030 Other cervical disc degeneration, unspecified cervical region: Secondary | ICD-10-CM | POA: Diagnosis present

## 2019-03-19 DIAGNOSIS — R4182 Altered mental status, unspecified: Secondary | ICD-10-CM | POA: Diagnosis not present

## 2019-03-19 DIAGNOSIS — R569 Unspecified convulsions: Secondary | ICD-10-CM | POA: Diagnosis present

## 2019-03-19 DIAGNOSIS — I129 Hypertensive chronic kidney disease with stage 1 through stage 4 chronic kidney disease, or unspecified chronic kidney disease: Secondary | ICD-10-CM | POA: Diagnosis present

## 2019-03-19 DIAGNOSIS — R0689 Other abnormalities of breathing: Secondary | ICD-10-CM | POA: Diagnosis not present

## 2019-03-19 DIAGNOSIS — R531 Weakness: Secondary | ICD-10-CM

## 2019-03-19 DIAGNOSIS — E872 Acidosis: Secondary | ICD-10-CM | POA: Diagnosis present

## 2019-03-19 DIAGNOSIS — R4189 Other symptoms and signs involving cognitive functions and awareness: Secondary | ICD-10-CM | POA: Diagnosis present

## 2019-03-19 DIAGNOSIS — S199XXA Unspecified injury of neck, initial encounter: Secondary | ICD-10-CM | POA: Diagnosis not present

## 2019-03-19 DIAGNOSIS — G9389 Other specified disorders of brain: Secondary | ICD-10-CM

## 2019-03-19 DIAGNOSIS — C711 Malignant neoplasm of frontal lobe: Secondary | ICD-10-CM | POA: Diagnosis not present

## 2019-03-19 DIAGNOSIS — W19XXXA Unspecified fall, initial encounter: Secondary | ICD-10-CM | POA: Diagnosis not present

## 2019-03-19 DIAGNOSIS — G939 Disorder of brain, unspecified: Principal | ICD-10-CM | POA: Diagnosis present

## 2019-03-19 DIAGNOSIS — I6523 Occlusion and stenosis of bilateral carotid arteries: Secondary | ICD-10-CM | POA: Diagnosis not present

## 2019-03-19 DIAGNOSIS — Z8249 Family history of ischemic heart disease and other diseases of the circulatory system: Secondary | ICD-10-CM | POA: Diagnosis not present

## 2019-03-19 DIAGNOSIS — I472 Ventricular tachycardia, unspecified: Secondary | ICD-10-CM

## 2019-03-19 DIAGNOSIS — Z1159 Encounter for screening for other viral diseases: Secondary | ICD-10-CM | POA: Diagnosis not present

## 2019-03-19 DIAGNOSIS — D430 Neoplasm of uncertain behavior of brain, supratentorial: Secondary | ICD-10-CM | POA: Diagnosis not present

## 2019-03-19 DIAGNOSIS — N182 Chronic kidney disease, stage 2 (mild): Secondary | ICD-10-CM | POA: Diagnosis present

## 2019-03-19 DIAGNOSIS — Z923 Personal history of irradiation: Secondary | ICD-10-CM

## 2019-03-19 DIAGNOSIS — E876 Hypokalemia: Secondary | ICD-10-CM | POA: Diagnosis present

## 2019-03-19 DIAGNOSIS — Z888 Allergy status to other drugs, medicaments and biological substances status: Secondary | ICD-10-CM

## 2019-03-19 DIAGNOSIS — I10 Essential (primary) hypertension: Secondary | ICD-10-CM | POA: Diagnosis present

## 2019-03-19 DIAGNOSIS — R4020 Unspecified coma: Secondary | ICD-10-CM | POA: Diagnosis not present

## 2019-03-19 DIAGNOSIS — Z8546 Personal history of malignant neoplasm of prostate: Secondary | ICD-10-CM | POA: Diagnosis not present

## 2019-03-19 DIAGNOSIS — I700 Atherosclerosis of aorta: Secondary | ICD-10-CM | POA: Diagnosis not present

## 2019-03-19 DIAGNOSIS — I491 Atrial premature depolarization: Secondary | ICD-10-CM | POA: Diagnosis not present

## 2019-03-19 DIAGNOSIS — S0083XA Contusion of other part of head, initial encounter: Secondary | ICD-10-CM

## 2019-03-19 DIAGNOSIS — N183 Chronic kidney disease, stage 3 unspecified: Secondary | ICD-10-CM | POA: Diagnosis present

## 2019-03-19 DIAGNOSIS — I499 Cardiac arrhythmia, unspecified: Secondary | ICD-10-CM | POA: Diagnosis not present

## 2019-03-19 DIAGNOSIS — K219 Gastro-esophageal reflux disease without esophagitis: Secondary | ICD-10-CM | POA: Diagnosis present

## 2019-03-19 DIAGNOSIS — E785 Hyperlipidemia, unspecified: Secondary | ICD-10-CM | POA: Diagnosis present

## 2019-03-19 DIAGNOSIS — Z20828 Contact with and (suspected) exposure to other viral communicable diseases: Secondary | ICD-10-CM | POA: Diagnosis not present

## 2019-03-19 DIAGNOSIS — R404 Transient alteration of awareness: Secondary | ICD-10-CM | POA: Diagnosis not present

## 2019-03-19 DIAGNOSIS — D496 Neoplasm of unspecified behavior of brain: Secondary | ICD-10-CM | POA: Diagnosis not present

## 2019-03-19 LAB — CK: Total CK: 172 U/L (ref 49–397)

## 2019-03-19 LAB — COMPREHENSIVE METABOLIC PANEL
ALT: 19 U/L (ref 0–44)
AST: 26 U/L (ref 15–41)
Albumin: 3.5 g/dL (ref 3.5–5.0)
Alkaline Phosphatase: 49 U/L (ref 38–126)
Anion gap: 17 — ABNORMAL HIGH (ref 5–15)
BUN: 23 mg/dL (ref 8–23)
CO2: 14 mmol/L — ABNORMAL LOW (ref 22–32)
Calcium: 8.7 mg/dL — ABNORMAL LOW (ref 8.9–10.3)
Chloride: 106 mmol/L (ref 98–111)
Creatinine, Ser: 1.69 mg/dL — ABNORMAL HIGH (ref 0.61–1.24)
GFR calc Af Amer: 43 mL/min — ABNORMAL LOW (ref >60)
GFR calc non Af Amer: 38 mL/min — ABNORMAL LOW (ref >60)
Glucose, Bld: 154 mg/dL — ABNORMAL HIGH (ref 70–99)
Potassium: 3.4 mmol/L — ABNORMAL LOW (ref 3.5–5.1)
Sodium: 137 mmol/L (ref 135–145)
Total Bilirubin: 2 mg/dL — ABNORMAL HIGH (ref 0.3–1.2)
Total Protein: 5.6 g/dL — ABNORMAL LOW (ref 6.5–8.1)

## 2019-03-19 LAB — I-STAT CHEM 8, ED
BUN: 25 mg/dL — ABNORMAL HIGH (ref 8–23)
Calcium, Ion: 1.07 mmol/L — ABNORMAL LOW (ref 1.15–1.40)
Chloride: 105 mmol/L (ref 98–111)
Creatinine, Ser: 1.5 mg/dL — ABNORMAL HIGH (ref 0.61–1.24)
Glucose, Bld: 157 mg/dL — ABNORMAL HIGH (ref 70–99)
HCT: 42 % (ref 39.0–52.0)
Hemoglobin: 14.3 g/dL (ref 13.0–17.0)
Potassium: 3.4 mmol/L — ABNORMAL LOW (ref 3.5–5.1)
Sodium: 137 mmol/L (ref 135–145)
TCO2: 17 mmol/L — ABNORMAL LOW (ref 22–32)

## 2019-03-19 LAB — CBC
HCT: 43.8 % (ref 39.0–52.0)
Hemoglobin: 15.5 g/dL (ref 13.0–17.0)
MCH: 33.9 pg (ref 26.0–34.0)
MCHC: 35.4 g/dL (ref 30.0–36.0)
MCV: 95.8 fL (ref 80.0–100.0)
Platelets: 160 10*3/uL (ref 150–400)
RBC: 4.57 MIL/uL (ref 4.22–5.81)
RDW: 11.8 % (ref 11.5–15.5)
WBC: 8.9 10*3/uL (ref 4.0–10.5)
nRBC: 0 % (ref 0.0–0.2)

## 2019-03-19 LAB — DIFFERENTIAL
Abs Immature Granulocytes: 0.04 10*3/uL (ref 0.00–0.07)
Basophils Absolute: 0 10*3/uL (ref 0.0–0.1)
Basophils Relative: 0 %
Eosinophils Absolute: 0.2 10*3/uL (ref 0.0–0.5)
Eosinophils Relative: 2 %
Immature Granulocytes: 1 %
Lymphocytes Relative: 31 %
Lymphs Abs: 2.8 10*3/uL (ref 0.7–4.0)
Monocytes Absolute: 0.7 10*3/uL (ref 0.1–1.0)
Monocytes Relative: 8 %
Neutro Abs: 5.2 10*3/uL (ref 1.7–7.7)
Neutrophils Relative %: 58 %

## 2019-03-19 LAB — TROPONIN I: Troponin I: <0.03 ng/mL (ref <0.03)

## 2019-03-19 LAB — D-DIMER, QUANTITATIVE: D-Dimer, Quant: 0.8 ug/mL-FEU — ABNORMAL HIGH (ref 0.00–0.50)

## 2019-03-19 LAB — APTT
aPTT: 26 seconds (ref 24–36)
aPTT: 27 seconds (ref 24–36)

## 2019-03-19 LAB — PROTIME-INR
INR: 1.1 (ref 0.8–1.2)
INR: 1.1 (ref 0.8–1.2)
Prothrombin Time: 14.5 seconds (ref 11.4–15.2)
Prothrombin Time: 14.3 seconds (ref 11.4–15.2)

## 2019-03-19 LAB — CBG MONITORING, ED: Glucose-Capillary: 152 mg/dL — ABNORMAL HIGH (ref 70–99)

## 2019-03-19 LAB — LACTIC ACID, PLASMA
Lactic Acid, Venous: 7.7 mmol/L (ref 0.5–1.9)
Lactic Acid, Venous: 2 mmol/L (ref 0.5–1.9)

## 2019-03-19 MED ORDER — SODIUM CHLORIDE 0.9 % IV SOLN
1000.0000 mL | INTRAVENOUS | Status: DC
  Administered 2019-03-19: 1000 mL via INTRAVENOUS

## 2019-03-19 MED ORDER — ONDANSETRON HCL 4 MG PO TABS: 4.0000 mg | ORAL_TABLET | Freq: Four times a day (QID) | ORAL | Status: DC | PRN

## 2019-03-19 MED ORDER — SODIUM CHLORIDE 0.9 % IV SOLN
INTRAVENOUS | Status: DC
  Administered 2019-03-19 – 2019-03-20 (×2): via INTRAVENOUS

## 2019-03-19 MED ORDER — CALCIUM CARBONATE ANTACID 500 MG PO CHEW
1.0000 | CHEWABLE_TABLET | Freq: Every evening | ORAL | Status: DC | PRN
  Administered 2019-03-20: 200 mg via ORAL
  Filled 2019-03-19: qty 1

## 2019-03-19 MED ORDER — SODIUM CHLORIDE 0.9% FLUSH
3.0000 mL | Freq: Once | INTRAVENOUS | Status: AC
  Administered 2019-03-19: 3 mL via INTRAVENOUS

## 2019-03-19 MED ORDER — LISINOPRIL 20 MG PO TABS
20.0000 mg | ORAL_TABLET | Freq: Every day | ORAL | Status: DC
  Administered 2019-03-20: 20 mg via ORAL
  Filled 2019-03-19: qty 1

## 2019-03-19 MED ORDER — ASPIRIN EC 81 MG PO TBEC
81.0000 mg | DELAYED_RELEASE_TABLET | Freq: Every day | ORAL | Status: DC
  Administered 2019-03-20: 81 mg via ORAL
  Filled 2019-03-19: qty 1

## 2019-03-19 MED ORDER — ONDANSETRON HCL 4 MG/2ML IJ SOLN: 4.0000 mg | Freq: Four times a day (QID) | INTRAMUSCULAR | Status: DC | PRN

## 2019-03-19 MED ORDER — SODIUM CHLORIDE 0.9 % IV BOLUS (SEPSIS)
500.0000 mL | Freq: Once | INTRAVENOUS | Status: AC
  Administered 2019-03-19: 500 mL via INTRAVENOUS

## 2019-03-19 MED ORDER — IOHEXOL 350 MG/ML SOLN
50.0000 mL | Freq: Once | INTRAVENOUS | Status: AC | PRN
  Administered 2019-03-19: 50 mL via INTRAVENOUS

## 2019-03-19 MED ORDER — LEVETIRACETAM IN NACL 1000 MG/100ML IV SOLN
1000.0000 mg | Freq: Once | INTRAVENOUS | Status: AC
  Administered 2019-03-19: 1000 mg via INTRAVENOUS
  Filled 2019-03-19: qty 100

## 2019-03-19 MED ORDER — POTASSIUM CHLORIDE 10 MEQ/100ML IV SOLN
10.0000 meq | Freq: Once | INTRAVENOUS | Status: AC
  Administered 2019-03-19: 10 meq via INTRAVENOUS
  Filled 2019-03-19: qty 100

## 2019-03-19 MED ORDER — GADOBUTROL 1 MMOL/ML IV SOLN
9.0000 mL | Freq: Once | INTRAVENOUS | Status: AC | PRN
  Administered 2019-03-19: 9 mL via INTRAVENOUS

## 2019-03-19 MED ORDER — IOHEXOL 350 MG/ML SOLN
100.0000 mL | Freq: Once | INTRAVENOUS | Status: AC | PRN
  Administered 2019-03-19: 50 mL via INTRAVENOUS

## 2019-03-19 MED ORDER — HYDRALAZINE HCL 20 MG/ML IJ SOLN: 5.0000 mg | INTRAMUSCULAR | Status: DC | PRN

## 2019-03-19 MED ORDER — ACETAMINOPHEN 650 MG RE SUPP: 650.0000 mg | Freq: Four times a day (QID) | RECTAL | Status: DC | PRN

## 2019-03-19 MED ORDER — SODIUM CHLORIDE 0.9 % IV BOLUS
1000.0000 mL | Freq: Once | INTRAVENOUS | Status: AC
  Administered 2019-03-19: 1000 mL via INTRAVENOUS

## 2019-03-19 MED ORDER — ACETAMINOPHEN 325 MG PO TABS: 650.0000 mg | ORAL_TABLET | Freq: Four times a day (QID) | ORAL | Status: DC | PRN

## 2019-03-19 MED ORDER — LORAZEPAM 2 MG/ML IJ SOLN: 1.0000 mg | INTRAMUSCULAR | Status: DC | PRN

## 2019-03-19 MED ORDER — LEVETIRACETAM IN NACL 500 MG/100ML IV SOLN
500.0000 mg | Freq: Two times a day (BID) | INTRAVENOUS | Status: DC
  Administered 2019-03-20: 500 mg via INTRAVENOUS
  Filled 2019-03-19: qty 100

## 2019-03-19 MED ORDER — ADULT MULTIVITAMIN W/MINERALS CH
1.0000 | ORAL_TABLET | Freq: Every day | ORAL | Status: DC
  Administered 2019-03-20: 1 via ORAL
  Filled 2019-03-19: qty 1

## 2019-03-19 NOTE — Code Documentation (Signed)
Code Stroke Arrived at 1503.   Per EMS - Patient had returned home after cycling 6 miles, he was walking around the house and cooling off per his wife. Per wife, patient got a cold drink and was sitting at the table and suddenly starting "shaking". Patient slumped out of the chair to the side and hit his head on the chair and then his head fell forward and hit his chin on the table and then proceeded to fall to the floor. By the time he got to the floor, he was no longer shaking.  The total time shaking was approximately 15 seconds. LSN 1315. NIH 4. LT Sided weakness was improving.   Per EMS was minimal responsive, was cyanotic and required to be bagged via BVM for a few minutes. Upon arrival was taken to TRAUMA A, quick exam by EDP and NEURO MD was done and then taken for CT, CTA H/N, CT C-S, and CT CHEST (PE r/o).   CT: Mass lesion in the posterior high left frontal lobe may involve the primary motor cortex. 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 Metastatic disease should also be considered.   Plan:  -- Code stroke cancelled.  -- Medicine to [place]. -- MRI.

## 2019-03-19 NOTE — Consult Note (Signed)
Chief Complaint   Chief Complaint  Patient presents with   Code Stroke    HPI   Consult requested by: Dr Vallery Ridge  Reason for consult: brain tumor  HPI: Brent Coleman is a 78 y.o. male  With history HTN, hyperlipidemia, prostate cancer s/p radiation 2013 who presented to ED as code stroke. By report was in normal health until approx 1315 today. He came in cycling approx 6 miles, sat down with wife having a glass of soda when he began shaking. He subsequently slumped to the side and hit his head on the chair and fell to the floor. Total time of shaking approx 15 sec. Wife immediately called EMS and he was found with left sided plegia & cyanotic with agonal breathing. Also had 10 beats of [place] en route. This gradually improved and essentially resolved. He underwent a stat head CT and ultimately a brain MRI after a brain tumor was seen. NSY consultation requested due to brain tumor.   Currently patient reports feeling generally weak but denies focal deficit. He remembers sitting talking to wife and then remembers waking up in the ER.  He is denies headache, dizziness, changes in vision, N/V. No history of HA/migraines in the past.   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   Routine general medical examination at a health care facility 05/02/2011   DIVERTICULOSIS OF COLON 11/30/2009   PERSONAL HX COLONIC POLYPS 11/30/2009   CHOLELITHIASIS 11/14/2009   BCC (basal cell carcinoma of skin) 10/14/2008   HYPERLIPIDEMIA 10/14/2008   History of prostate cancer 05/09/2007   Essential hypertension 05/09/2007    PMH: Past Medical  History:  Diagnosis Date   Basal cell carcinoma    sees derm q year   Erectile dysfunction due to arterial insufficiency    moderate-patient not interested in treatment   Gallstone    GERD (gastroesophageal reflux disease)    Hyperlipidemia    Hypertension    Malignant neoplasm of prostate ([place]) ~ 2007   sees urology q year    PSH: Past Surgical History:  Procedure Laterality Date   POLYPECTOMY     TONSILLECTOMY  1949    (Not in a hospital admission)   SH: Social History   Tobacco Use   Smoking status: Never Smoker   Smokeless tobacco: Never Used  Substance Use Topics   Alcohol use: Yes   Drug use: No    MEDS: Prior to Admission medications   Medication Sig Start Date End Date Taking? Authorizing Provider  aspirin EC 81 MG tablet Take 81 mg by mouth daily.   Yes [provider]  Calcium Carbonate Antacid (TUMS PO) Take 2 tablets by mouth at bedtime as needed (acid reflux).   Yes [provider]  lisinopril (ZESTRIL) 20 MG tablet Take 1 tablet (20 mg total) by mouth daily. 02/11/19  Yes Paz, Alda Berthold, MD  Multiple Vitamins-Minerals (MULTIVITAMIN WITH MINERALS) tablet Take 1 tablet by mouth daily.     Yes [provider]    ALLERGY: Allergies  Allergen Reactions   Doxazosin Mesylate Other (See Comments)    REACTION: Abdominal pain    Social History  Tobacco Use   Smoking status: Never Smoker   Smokeless tobacco: Never Used  Substance Use Topics   Alcohol use: Yes     Family History  Problem Relation Age of Onset   Colon cancer Mother 19   Colon cancer Father 50       colon   Hypertension Other    Stroke Other    CAD Neg Hx    Prostate cancer Neg Hx    Diabetes Neg Hx      ROS   Review of Systems  Constitutional: Positive for malaise/fatigue.  HENT: Negative.   Eyes: Negative.   Cardiovascular: Negative.   Gastrointestinal: Negative.   Genitourinary: Negative.   Musculoskeletal: Positive for  falls.  Skin: Negative.   Neurological: Positive for seizures and loss of consciousness. Negative for dizziness, tingling, tremors, sensory change, speech change, focal weakness, weakness and headaches.    Exam   Vitals:   03/19/19 1900 03/19/19 1930  BP: (!) 165/88 (!) 166/93  Pulse: 85   Resp: 16 19  Temp:    SpO2: 97%    General appearance: elderly male, resting comfortably, left orbital bruising and skin tear Eyes: No scleral injection Cardiovascular: Regular rate and rhythm without murmurs, rubs, gallops. No edema or variciosities. Distal pulses normal. Pulmonary: Effort normal, non-labored breathing Musculoskeletal:     Muscle tone upper extremities: Normal    Muscle tone lower extremities: Normal    Motor exam: Upper Extremities Deltoid Bicep Tricep Grip  Right 5/5 5/5 5/5 5/5  Left 5/5 5/5 5/5 5/5   Lower Extremity IP Quad PF DF EHL  Right 5/5 5/5 5/5 5/5 5/5  Left 5/5 5/5 5/5 5/5 5/5   Neurological Mental Status:    - Patient is awake, alert, oriented to person, place, month, year, and situation    - No signs of aphasia or neglect Cranial Nerves    - II: Visual Fields are full. PERRL    - III/IV/VI: EOMI without ptosis or diploplia.     - V: Facial sensation is grossly normal    - VII: Facial movement is symmetric.     - VIII: hearing is intact to voice    - X: Uvula elevates symmetrically    - XI: Shoulder shrug is symmetric.    - XII: tongue is midline without atrophy or fasciculations.  Sensory: Sensation grossly intact to LT Deep Tendon Reflexes    - 2+ and symmetric in the biceps and patellae.  Plantars   - Toes are downgoing bilaterally.  Cerebellar    - FNF and HKS are intact bilaterally   Results - Imaging/Labs   Results for orders placed or performed during the hospital encounter of 03/19/19 (from the past 48 hour(s))  CBG monitoring, ED     Status: Abnormal   Collection Time: 03/19/19  3:04 PM  Result Value Ref Range   Glucose-Capillary  152 (H) 70 - 99 mg/dL  Protime-INR     Status: None   Collection Time: 03/19/19  3:05 PM  Result Value Ref Range   Prothrombin Time 14.5 11.4 - 15.2 seconds   INR 1.1 0.8 - 1.2    Comment: (NOTE) INR goal varies based on device and disease states. Performed at [facility], [place] [address] [place], [place] [address]   APTT     Status: None   Collection Time: 03/19/19  3:05 PM  Result Value Ref Range   aPTT 26 24 - 36 seconds    Comment:  Performed at [facility], [place] [address], [place], [place] [address]  CBC     Status: None   Collection Time: 03/19/19  3:05 PM  Result Value Ref Range   WBC 8.9 4.0 - 10.5 K/uL   RBC 4.57 4.22 - 5.81 MIL/uL   Hemoglobin 15.5 13.0 - 17.0 g/dL   HCT 43.8 39.0 - 52.0 %   MCV 95.8 80.0 - 100.0 fL   MCH 33.9 26.0 - 34.0 pg   MCHC 35.4 30.0 - 36.0 g/dL   RDW 11.8 11.5 - 15.5 %   Platelets 160 150 - 400 K/uL   nRBC 0.0 0.0 - 0.2 %    Comment: Performed at [facility], [place] [address] [place], [place] [address]  Differential     Status: None   Collection Time: 03/19/19  3:05 PM  Result Value Ref Range   Neutrophils Relative % 58 %   Neutro Abs 5.2 1.7 - 7.7 K/uL   Lymphocytes Relative 31 %   Lymphs Abs 2.8 0.7 - 4.0 K/uL   Monocytes Relative 8 %   Monocytes Absolute 0.7 0.1 - 1.0 K/uL   Eosinophils Relative 2 %   Eosinophils Absolute 0.2 0.0 - 0.5 K/uL   Basophils Relative 0 %   Basophils Absolute 0.0 0.0 - 0.1 K/uL   Immature Granulocytes 1 %   Abs Immature Granulocytes 0.04 0.00 - 0.07 K/uL    Comment: Performed at [facility], [place] [address] [place], [place] [address]  Comprehensive metabolic panel     Status: Abnormal   Collection Time: 03/19/19  3:05 PM  Result Value Ref Range   Sodium 137 135 - 145 mmol/L   Potassium 3.4 (L) 3.5 - 5.1 mmol/L   Chloride 106 98 - 111 mmol/L   CO2 14 (L) 22 - 32 mmol/L   Glucose, Bld 154 (H) 70 - 99 mg/dL   BUN 23 8 - 23 mg/dL   Creatinine, Ser 1.69 (H) 0.61 - 1.24 mg/dL     Calcium 8.7 (L) 8.9 - 10.3 mg/dL   Total Protein 5.6 (L) 6.5 - 8.1 g/dL   Albumin 3.5 3.5 - 5.0 g/dL   AST 26 15 - 41 U/L   ALT 19 0 - 44 U/L   Alkaline Phosphatase 49 38 - 126 U/L   Total Bilirubin 2.0 (H) 0.3 - 1.2 mg/dL    Comment: RESULTS CONFIRMED BY MANUAL DILUTION   GFR calc non Af Amer 38 (L) >60 mL/min   GFR calc Af Amer 43 (L) >60 mL/min   Anion gap 17 (H) 5 - 15    Comment: Performed at [facility], [place] [address], [place], [place] [address]  D-dimer, quantitative (not at [facility])     Status: Abnormal   Collection Time: 03/19/19  3:05 PM  Result Value Ref Range   D-Dimer, Quant 0.80 (H) 0.00 - 0.50 ug/mL-FEU    Comment: (NOTE) At the manufacturer cut-off of 0.50 ug/mL FEU, this assay has been documented to exclude PE with a sensitivity and negative predictive value of 97 to 99%.  At this time, this assay has not been approved by the FDA to exclude DVT/VTE. Results should be correlated with clinical presentation. Performed at [facility], [place] [address], [place], [place] [address]   Troponin I - Once     Status: None   Collection Time: 03/19/19  3:05 PM  Result Value Ref Range   Troponin I <0.03 <0.03  ng/mL    Comment: Performed at [facility], [place] [address] [place], [place] [address]  CK     Status: None   Collection Time: 03/19/19  3:05 PM  Result Value Ref Range   Total CK 172 49 - 397 U/L    Comment: Performed at [facility], [place] [address], [place], [place] [address]  I-stat chem 8, ED     Status: Abnormal   Collection Time: 03/19/19  3:09 PM  Result Value Ref Range   Sodium 137 135 - 145 mmol/L   Potassium 3.4 (L) 3.5 - 5.1 mmol/L   Chloride 105 98 - 111 mmol/L   BUN 25 (H) 8 - 23 mg/dL   Creatinine, Ser 1.50 (H) 0.61 - 1.24 mg/dL   Glucose, Bld 157 (H) 70 - 99 mg/dL   Calcium, Ion 1.07 (L) 1.15 - 1.40 mmol/L   TCO2 17 (L) 22 - 32 mmol/L   Hemoglobin 14.3 13.0 - 17.0 g/dL   HCT 42.0 39.0 - 52.0 %  Lactic acid, plasma      Status: Abnormal   Collection Time: 03/19/19  3:22 PM  Result Value Ref Range   Lactic Acid, Venous 7.7 (HH) 0.5 - 1.9 mmol/L    Comment: CRITICAL RESULT CALLED TO, READ BACK BY AND VERIFIED WITH: MORRIS, T RN @ 1549 ON 03/19/2019 BY TEMOCHE, H Performed at [facility], [address] [place], [place] [address]   Lactic acid, plasma     Status: Abnormal   Collection Time: 03/19/19  6:22 PM  Result Value Ref Range   Lactic Acid, Venous 2.0 (HH) 0.5 - 1.9 mmol/L    Comment: CRITICAL RESULT CALLED TO, READ BACK BY AND VERIFIED WITH: Sheilah Pigeon RN @ 1916 ON 03/19/2019 BY TEMOCHE, H Performed at [facility], [place] [address] [place], [place] [address]     Ct Angio Head W Or Wo Contrast  Result Date: 03/19/2019 CLINICAL DATA:  Left-sided weakness. EXAM: CT ANGIOGRAPHY HEAD AND NECK TECHNIQUE: Multidetector CT imaging of the head and neck was performed using the standard protocol during bolus administration of intravenous contrast. Multiplanar CT image reconstructions and MIPs were obtained to evaluate the vascular anatomy. Carotid stenosis measurements (when applicable) are obtained utilizing NASCET criteria, using the distal internal carotid diameter as the denominator. CONTRAST:  41mL OMNIPAQUE IOHEXOL 350 MG/ML SOLN COMPARISON:  CT head without contrast 03/19/2019 FINDINGS: CTA NECK FINDINGS Aortic arch: A 3 vessel arch configuration is present. Mild atherosclerotic changes are noted at the aortic arch. There is no significant stenosis or aneurysm. Right carotid system: The right common carotid artery is within normal limits. Calcifications are present at the origin of the right ICA. There is mild tortuosity in the right ICA without a significant stenosis. Left carotid system: The left common carotid artery is within normal limits. Atherosclerotic changes are noted in the proximal left ICA without a significant stenosis. There is mild tortuosity of the cervical left ICA without  stenosis. Vertebral arteries: The left vertebral artery is the dominant vessel. Both vertebral arteries originate from the subclavian arteries without significant stenoses. There is focal calcification at the origin of the right vertebral artery. There is no significant stenosis of either vertebral artery in the neck. Skeleton: Multilevel endplate degenerative changes are present C3-4 through C6-7. Uncovertebral spurring contributes to foraminal narrowing on the right at C4-5 and C5-6 on the left at C3-4. Marrow signal and vertebral body heights are normal. Other neck: Soft  tissues the neck are otherwise unremarkable. No focal mucosal or submucosal lesions are present. Salivary glands are within normal limits. No significant adenopathy is present. Thyroid is normal. Upper chest: There is fluid within a mildly dilated esophagus. Lung apices are clear. Thoracic inlet is within normal limits. Review of the MIP images confirms the above findings CTA HEAD FINDINGS Anterior circulation: Atherosclerotic calcifications are present in the cavernous internal carotid arteries bilaterally without a significant stenosis relative to the more distal vessel. The ICA termini are normal bilaterally. The left A1 segment is aplastic. The anterior communicating artery is patent. Both A2 segments fill from the right. The M1 segments are normal bilaterally. MCA bifurcations are intact. There is some attenuation of distal MCA branch vessels bilaterally. No significant proximal stenosis or occlusion is present. Posterior circulation: The left vertebral artery is the dominant vessel. PICA origins are visualized and normal. Vertebrobasilar junction is normal. The basilar artery is normal. The left posterior cerebral artery is of fetal type. The right posterior cerebral artery originates from the basilar tip. The PCA branch vessels demonstrate distal narrowing bilaterally. No significant proximal stenosis or occlusion is present. Venous  sinuses: Dural sinuses are patent. Straight sinus deep cerebral veins are intact. Cortical veins are unremarkable. Anatomic variants: Fetal type left posterior cerebral artery. Delayed phase: The delayed images demonstrate subtle enhancement of the posterior left frontal mass. No other focal lesions are present. The lesion measures 2.6 x 2.5 x 2.8 cm. No other focal lesions are present. There is no infarct on the postcontrast images. Review of the MIP images confirms the above findings IMPRESSION: 1. No significant large vessel occlusion. 2. Atherosclerotic changes at the carotid bifurcations and cavernous internal carotid arteries bilaterally without significant stenosis. 3. Moderate attenuation of distal branch vessels in the anterior and posterior circulation without a significant proximal stenosis or occlusion within the circle of Willis. 4. 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 Metastatic disease is considered less likely. MRI the brain without and with contrast would be useful for further evaluation. Electronically Signed   By: San Morelle M.D.   On: 03/19/2019 16:30   Ct Angio Neck W Or Wo Contrast  Result Date: 03/19/2019 CLINICAL DATA:  Left-sided weakness. EXAM: CT ANGIOGRAPHY HEAD AND NECK TECHNIQUE: Multidetector CT imaging of the head and neck was performed using the standard protocol during bolus administration of intravenous contrast. Multiplanar CT image reconstructions and MIPs were obtained to evaluate the vascular anatomy. Carotid stenosis measurements (when applicable) are obtained utilizing NASCET criteria, using the distal internal carotid diameter as the denominator. CONTRAST:  49mL OMNIPAQUE IOHEXOL 350 MG/ML SOLN COMPARISON:  CT head without contrast 03/19/2019 FINDINGS: CTA NECK FINDINGS Aortic arch: A 3 vessel arch configuration is present. Mild atherosclerotic changes are noted at the aortic arch. There is no significant  stenosis or aneurysm. Right carotid system: The right common carotid artery is within normal limits. Calcifications are present at the origin of the right ICA. There is mild tortuosity in the right ICA without a significant stenosis. Left carotid system: The left common carotid artery is within normal limits. Atherosclerotic changes are noted in the proximal left ICA without a significant stenosis. There is mild tortuosity of the cervical left ICA without stenosis. Vertebral arteries: The left vertebral artery is the dominant vessel. Both vertebral arteries originate from the subclavian arteries without significant stenoses. There is focal calcification at the origin of the right vertebral artery. There is no significant stenosis  of either vertebral artery in the neck. Skeleton: Multilevel endplate degenerative changes are present C3-4 through C6-7. Uncovertebral spurring contributes to foraminal narrowing on the right at C4-5 and C5-6 on the left at C3-4. Marrow signal and vertebral body heights are normal. Other neck: Soft tissues the neck are otherwise unremarkable. No focal mucosal or submucosal lesions are present. Salivary glands are within normal limits. No significant adenopathy is present. Thyroid is normal. Upper chest: There is fluid within a mildly dilated esophagus. Lung apices are clear. Thoracic inlet is within normal limits. Review of the MIP images confirms the above findings CTA HEAD FINDINGS Anterior circulation: Atherosclerotic calcifications are present in the cavernous internal carotid arteries bilaterally without a significant stenosis relative to the more distal vessel. The ICA termini are normal bilaterally. The left A1 segment is aplastic. The anterior communicating artery is patent. Both A2 segments fill from the right. The M1 segments are normal bilaterally. MCA bifurcations are intact. There is some attenuation of distal MCA branch vessels bilaterally. No significant proximal stenosis or  occlusion is present. Posterior circulation: The left vertebral artery is the dominant vessel. PICA origins are visualized and normal. Vertebrobasilar junction is normal. The basilar artery is normal. The left posterior cerebral artery is of fetal type. The right posterior cerebral artery originates from the basilar tip. The PCA branch vessels demonstrate distal narrowing bilaterally. No significant proximal stenosis or occlusion is present. Venous sinuses: Dural sinuses are patent. Straight sinus deep cerebral veins are intact. Cortical veins are unremarkable. Anatomic variants: Fetal type left posterior cerebral artery. Delayed phase: The delayed images demonstrate subtle enhancement of the posterior left frontal mass. No other focal lesions are present. The lesion measures 2.6 x 2.5 x 2.8 cm. No other focal lesions are present. There is no infarct on the postcontrast images. Review of the MIP images confirms the above findings IMPRESSION: 1. No significant large vessel occlusion. 2. Atherosclerotic changes at the carotid bifurcations and cavernous internal carotid arteries bilaterally without significant stenosis. 3. Moderate attenuation of distal branch vessels in the anterior and posterior circulation without a significant proximal stenosis or occlusion within the circle of Willis. 4. 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 Metastatic disease is considered less likely. MRI the brain without and with contrast would be useful for further evaluation. Electronically Signed   By: San Morelle M.D.   On: 03/19/2019 16:30   Ct Angio Chest Pe W/cm &/or Wo Cm  Result Date: 03/19/2019 CLINICAL DATA:  Concern for pulmonary embolism. EXAM: CT ANGIOGRAPHY CHEST WITH CONTRAST TECHNIQUE: Multidetector CT imaging of the chest was performed using the standard protocol during bolus administration of intravenous contrast. Multiplanar CT image reconstructions and  MIPs were obtained to evaluate the vascular anatomy. CONTRAST:  70mL OMNIPAQUE IOHEXOL 350 MG/ML SOLN COMPARISON:  None. FINDINGS: Cardiovascular: The CTA is timed such that the aorta is opacified while the pulmonary arteries are poorly opacified. The this may in part be a secondary to patient condition as well as small contrast bolus. There is no definitive filling defects within the pulmonary arteries although poorly evaluated as above. No evidence of RIGHT ventricular strain. Coronary artery calcification and aortic atherosclerotic calcification. Aorta is normal. Mediastinum/Nodes: No axillary supraclavicular adenopathy. No mediastinal hilar adenopathy. No pericardial effusion. Lungs/Pleura: No pulmonary infarction.  No pleural fluid. Upper Abdomen: Limited view of the liver, kidneys, pancreas are unremarkable. Normal adrenal glands. Musculoskeletal: No aggressive osseous lesion. Review of the MIP images confirms the  above findings. IMPRESSION: 1. No gross evidence acute pulmonary embolism on suboptimal exam. Consider anticoagulation and repeat exam when patient's renal function improves. 2. Coronary artery calcification and Aortic Atherosclerosis (ICD10-I70.0). 3. No pulmonary infarction or pneumonia. Electronically Signed   By: Suzy Bouchard M.D.   On: 03/19/2019 16:18   Mr Jeri Cos [place] Contrast  Result Date: 03/19/2019 CLINICAL DATA:  Abnormal CT of the head. Patient collapsed after a run. EXAM: MRI HEAD WITHOUT AND WITH CONTRAST TECHNIQUE: Multiplanar, multiecho pulse sequences of the brain and surrounding structures were obtained without and with intravenous contrast. CONTRAST:  9 mL Gadavist COMPARISON:  CT head without contrast 03/19/2019 FINDINGS: Brain: Infiltrating mass is present in the left superior frontal gyrus. Tumor measures 2.6 x 4.2 x 3.5 cm. There is subtle enhancement associated with the lesion. The most significant enhancement is superiorly along the dura. There is no significant cystic  component. There is focal cortical thickening and increased T2 signal in the posterior cingulate gyrus, best seen on images 17 and 18 of series 13 and image 12 of series 21. There is no enhancement associated with this area. No other focal lesions are present. Asymmetric T2 hyperintensity is present within the medial left temporal lobe and hippocampus. Mild periventricular T2 hyperintensities are otherwise within normal limits for age. The ventricles are of normal size. No significant extraaxial fluid collection is present. Vascular: Normal flow voids. Skull and upper cervical spine: Insert normal skull Sinuses/Orbits: Mild mucosal thickening is present in the ethmoid air cells bilaterally. Polyps or mucous retention cysts are noted inferiorly in the maxillary sinuses. The mastoid air cells are clear. Globes and orbits are within normal limits. IMPRESSION: 1. Infiltrating tumor mass in the left superior frontal gyrus measures 2.6 x 4.2 x 3.5 cm. There is subtle enhancement. Focal enhancement is associated with the dura. 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 The differential diagnosis includes oligodendroglioma. Ganglioglioma or DNET could have a similar appearance but are atypical in this age group. Metastatic disease is considered unlikely with this appearance. 2. Additional focal area of T2 hyperintensity and cortical thickening posteriorly in the cingulate gyrus may represent a second area of tumor. 3. Subtle T2 hyperintensity in the medial left temporal lobe could be related to seizure activity. Electronically Signed   By: San Morelle M.D.   On: 03/19/2019 18:39   Mr Cervical Spine Wo Contrast  Result Date: 03/19/2019 CLINICAL DATA:  Cervical spine trauma, myelopathy. Collapsed on floor after coming home from a run. EXAM: MRI CERVICAL SPINE WITHOUT CONTRAST TECHNIQUE: Multiplanar, multisequence MR imaging of the cervical spine was performed. No intravenous contrast was administered.  COMPARISON:  CTA of the neck 03/19/2019 FINDINGS: Alignment: Slight retrolisthesis is evident at C3-4. Slight anterolisthesis is present at C7-T1, T1-2, T2-3, and T3-4. AP alignment is otherwise anatomic. Vertebrae: Edematous endplate marrow changes are noted posteriorly at C4-5. Chronic fatty endplate marrow changes are present at C3-4, C5-6, and C6-7. Cord: Normal signal and morphology are present in the cervical and upper thoracic spinal cord to the lowest imaged level, T3-4. Posterior Fossa, vertebral arteries, paraspinal tissues: Craniocervical junction is normal. Flow is present in the vertebral arteries bilaterally. Visualized intracranial contents are normal. Disc levels: C2-3: Mild uncovertebral spurring is present on the right. There is no significant stenosis. C3-4: A broad-based disc osteophyte complex is present. There is partial effacement of the ventral CSF. Uncovertebral and facet spurring contribute to moderate foraminal narrowing, left greater than right. C4-5: A broad-based disc osteophyte complex is  present. There is effacement of ventral CSF and slight distortion of ventral surface the cord. The canal is narrowed to 8.5 mm. Severe right and moderate left foraminal narrowing is due to uncovertebral and facet disease. C5-6: A broad-based disc osteophyte complex present. There is effacement of ventral scratched at there is partial effacement ventral CSF. Moderate foraminal stenosis due to uncovertebral and facet disease is worse left than right. C6-7: A leftward disc osteophyte complex is present. There is effacement of the ventral CSF. Moderate left and mild right foraminal narrowing is present. C7-T1: Moderate facet hypertrophy is worse left than right. There is no significant disc herniation or stenosis. IMPRESSION: 1. No acute abnormality. 2. Multilevel degenerative disc disease as described. 3. Mild central and moderate bilateral foraminal narrowing at C3-4 is worse left than right. 4.  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 This is the most severe level. This is also the only level with edematous endplate marrow changes, suggesting the most recent progression. 5. Mild central and moderate bilateral foraminal stenosis at C5-6 is worse left than right. 6. Moderate left and mild right foraminal narrowing at C6-7. 7. Moderate facet hypertrophy at C7-T1 is worse on the left. There is no significant focal stenosis. Electronically Signed   By: San Morelle M.D.   On: 03/19/2019 17:53   Ct Head Code Stroke Wo Contrast  Result Date: 03/19/2019 CLINICAL DATA:  Code stroke.  Left-sided weakness. EXAM: CT HEAD WITHOUT CONTRAST TECHNIQUE: Contiguous axial images were obtained from the base of the skull through the vertex without intravenous contrast. COMPARISON:  None. FINDINGS: Brain: No acute infarct is present. The insular ribbon is normal bilaterally. Basal ganglia are intact. There is a mass lesion in the high posterior left frontal lobe. Lesion blends with the gray matter. It measures approximately 2.6 x 2.5 x 3.0 cm. The tumor extends nearly to the corpus callosum. No other definite lesions are present. No significant white matter lesions are present. The ventricles are of normal size. The brainstem and cerebellum are within normal limits. No significant extraaxial fluid collection is present. Vascular: No hyperdense vessel or unexpected calcification. Skull: Insert normal calvarium no focal lytic or blastic lesions are present. Sinuses/Orbits: The left ethmoid air cell is opacified. Polyps or mucous retention cysts are present along the inferior maxillary sinuses. The remaining paranasal sinuses and mastoid air cells are otherwise clear. The globes and orbits are within normal limits. ASPECTS [facility] Stroke Program Early CT Score) - Ganglionic level infarction (caudate, lentiform nuclei, internal capsule, insula, M1-M3 cortex): 7/7 -  Supraganglionic infarction (M4-M6 cortex): 3/3 Total score (0-10 with 10 being normal): 10/10 IMPRESSION: 1. No acute infarct. 2. Mass lesion in the posterior high left frontal lobe may involve the primary motor cortex. 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 Metastatic disease should also be considered. Consider MRI the brain without and with contrast for further evaluation. 3. ASPECTS is 10/10 These results were called by telephone at the time of interpretation on 03/19/2019 at 3:31 pm to Dr. Leonel Ramsay , who verbally acknowledged these results. Electronically Signed   By: San Morelle M.D.   On: 03/19/2019 15:35    Impression/Plan   78 y.o. male who presents after likely new onset seizure per Neurology & with new left superior frontal gyrus tumor. With the tumor being left sided and initial left sided plegia which would not correlate, there was concern over C spine injury.  Patient is now neurologically intact. He also did have 10 beat episode of Vtach in route.   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  - Patient's case with be discussed at tumor board Monday - No need for decadron at present  New onset seizure - per Neurology  Left sided plegia - resolved - MRI C spine reviewed with Dr Kathyrn Sheriff. No evidence of C spine injury. Patient denies neck pain. Aspen c collar removed.  Stephanie Coup - Per Cardiology   Ferne Reus, PA-C [facility]

## 2019-03-19 NOTE — ED Notes (Signed)
Ruby ([facility] called)Cancel Code Stroke-per Dr. Harlin Rain by Levada Dy

## 2019-03-19 NOTE — ED Notes (Signed)
Pt neighbor's phone number because patients wife Brent Coleman does not have her phone on her  Brent Coleman [phone] Placed in consult A

## 2019-03-19 NOTE — ED Notes (Signed)
ED TO INPATIENT HANDOFF REPORT  ED Nurse Name and Phone #: [phone]  S Name/Age/Gender Brent Coleman 78 y.o. male Room/Bed: TRAAC/TRAAC  Code Status   Code Status: Not on file  Home/SNF/Other Home Patient oriented to: self, place, time and situation Is this baseline? Yes      Chief Complaint Code stroke  Triage Note Pt bib [facility] from home. Wife called due to pt collapsing in the floor after coming home from a run. Ems found pt agonal, cyanotic and unresponsive. Pt bagged for 3-4 mins, became responsive. Pt on NRB upon arrival to ed, saturations 99% pt alert c/o resp distress. Ems reports pt flaccid on the left side. Not on any blood thinners.  BP initially 190/100 then 150/60 P 120s sinus tach with runs of v tach en route to ED CBG 104   Allergies Allergies  Allergen Reactions  . Doxazosin Mesylate Other (See Comments)    REACTION: Abdominal pain    Level of Care/Admitting Diagnosis ED Disposition    ED Disposition Condition Comment   Admit  Hospital Area: [facility] [[id number]]  Level of Care: Progressive [102]  Covid Evaluation: Screening Protocol (No Symptoms)  Diagnosis: [id number] [[id number]]  Admitting Physician: Ivor Costa [[id number]]  Attending Physician: Ivor Costa [phone]  Estimated length of stay: past midnight tomorrow  Certification:: I certify this patient will need inpatient services for at least 2 midnights  PT Class (Do Not Modify): Inpatient [101]  PT Acc Code (Do Not Modify): Private [1]       B Medical/Surgery History Past Medical History:  Diagnosis Date  . Basal cell carcinoma    sees derm q year  . 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 Gallstone   . GERD (gastroesophageal reflux disease)   . Hyperlipidemia   . Hypertension   . Malignant neoplasm of prostate [facility]) ~ 2007   sees urology q year   Past Surgical History:  Procedure Laterality Date  .  POLYPECTOMY    . TONSILLECTOMY  1949     A IV Location/Drains/Wounds Patient Lines/Drains/Airways Status   Active Line/Drains/Airways    Name:   Placement date:   Placement time:   Site:   Days:   Peripheral IV 03/19/19 Left Antecubital   03/19/19    1547    Antecubital   less than 1   Peripheral IV 03/19/19 Left Forearm   03/19/19    1843    Forearm   less than 1          Intake/Output Last 24 hours No intake or output data in the 24 hours ending 03/19/19 1958  Labs/Imaging Results for orders placed or performed during the hospital encounter of 03/19/19 (from the past 48 hour(s))  CBG monitoring, ED     Status: Abnormal   Collection Time: 03/19/19  3:04 PM  Result Value Ref Range   Glucose-Capillary 152 (H) 70 - 99 mg/dL  Protime-INR     Status: None   Collection Time: 03/19/19  3:05 PM  Result Value Ref Range   Prothrombin Time 14.5 11.4 - 15.2 seconds   INR 1.1 0.8 - 1.2    Comment: (NOTE) INR goal varies based on device and disease states. Performed at [facility], [place] [address] [place], [place] [address]   APTT     Status: None   Collection Time: 03/19/19  3:05 PM  Result Value Ref Range   aPTT 26  24 - 36 seconds    Comment: Performed at [facility], [place] [address], [place], [place] [address]  CBC     Status: None   Collection Time: 03/19/19  3:05 PM  Result Value Ref Range   WBC 8.9 4.0 - 10.5 K/uL   RBC 4.57 4.22 - 5.81 MIL/uL   Hemoglobin 15.5 13.0 - 17.0 g/dL   HCT 43.8 39.0 - 52.0 %   MCV 95.8 80.0 - 100.0 fL   MCH 33.9 26.0 - 34.0 pg   MCHC 35.4 30.0 - 36.0 g/dL   RDW 11.8 11.5 - 15.5 %   Platelets 160 150 - 400 K/uL   nRBC 0.0 0.0 - 0.2 %    Comment: Performed at [facility], [place] [address] [place], [place] [address]  Differential     Status: None   Collection Time: 03/19/19  3:05 PM  Result Value Ref Range   Neutrophils Relative % 58 %   Neutro Abs 5.2 1.7 - 7.7 K/uL   Lymphocytes Relative 31 %   Lymphs Abs 2.8 0.7 - 4.0  K/uL   Monocytes Relative 8 %   Monocytes Absolute 0.7 0.1 - 1.0 K/uL   Eosinophils Relative 2 %   Eosinophils Absolute 0.2 0.0 - 0.5 K/uL   Basophils Relative 0 %   Basophils Absolute 0.0 0.0 - 0.1 K/uL   Immature Granulocytes 1 %   Abs Immature Granulocytes 0.04 0.00 - 0.07 K/uL    Comment: Performed at [facility], [place] [address], [place], [place] [address]  Comprehensive metabolic panel     Status: Abnormal   Collection Time: 03/19/19  3:05 PM  Result Value Ref Range   Sodium 137 135 - 145 mmol/L   Potassium 3.4 (L) 3.5 - 5.1 mmol/L   Chloride 106 98 - 111 mmol/L   CO2 14 (L) 22 - 32 mmol/L   Glucose, Bld 154 (H) 70 - 99 mg/dL   BUN 23 8 - 23 mg/dL   Creatinine, Ser 1.69 (H) 0.61 - 1.24 mg/dL   Calcium 8.7 (L) 8.9 - 10.3 mg/dL   Total Protein 5.6 (L) 6.5 - 8.1 g/dL   Albumin 3.5 3.5 - 5.0 g/dL   AST 26 15 - 41 U/L   ALT 19 0 - 44 U/L   Alkaline Phosphatase 49 38 - 126 U/L   Total Bilirubin 2.0 (H) 0.3 - 1.2 mg/dL    Comment: RESULTS CONFIRMED BY MANUAL DILUTION   GFR calc non Af Amer 38 (L) >60 mL/min   GFR calc Af Amer 43 (L) >60 mL/min   Anion gap 17 (H) 5 - 15    Comment: Performed at [facility], [place] [address], [place], [place] [address]  D-dimer, quantitative (not at [facility])     Status: Abnormal   Collection Time: 03/19/19  3:05 PM  Result Value Ref Range   D-Dimer, Quant 0.80 (H) 0.00 - 0.50 ug/mL-FEU    Comment: (NOTE) At the manufacturer cut-off of 0.50 ug/mL FEU, this assay has been documented to exclude PE with a sensitivity and negative predictive value of 97 to 99%.  At this time, this assay has not been approved by the FDA to exclude DVT/VTE. Results should be correlated with clinical presentation. Performed at [facility], [place] [address] [place], [place] [address]   Troponin I - Once     Status: None   Collection Time: 03/19/19  3:05 PM  Result Value Ref  Range   Troponin I <0.03 <0.03 ng/mL    Comment: Performed at [place] [address] [place], [place] [address]  CK     Status: None   Collection Time: 03/19/19  3:05 PM  Result Value Ref Range   Total CK 172 49 - 397 U/L    Comment: Performed at [facility], [place] [address], [place], [place] [address]  I-stat chem 8, ED     Status: Abnormal   Collection Time: 03/19/19  3:09 PM  Result Value Ref Range   Sodium 137 135 - 145 mmol/L   Potassium 3.4 (L) 3.5 - 5.1 mmol/L   Chloride 105 98 - 111 mmol/L   BUN 25 (H) 8 - 23 mg/dL   Creatinine, Ser 1.50 (H) 0.61 - 1.24 mg/dL   Glucose, Bld 157 (H) 70 - 99 mg/dL   Calcium, Ion 1.07 (L) 1.15 - 1.40 mmol/L   TCO2 17 (L) 22 - 32 mmol/L   Hemoglobin 14.3 13.0 - 17.0 g/dL   HCT 42.0 39.0 - 52.0 %  Lactic acid, plasma     Status: Abnormal   Collection Time: 03/19/19  3:22 PM  Result Value Ref Range   Lactic Acid, Venous 7.7 (HH) 0.5 - 1.9 mmol/L    Comment: CRITICAL RESULT CALLED TO, READ BACK BY AND VERIFIED WITH: Akhilesh Sassone, T RN @ 1549 ON 03/19/2019 BY TEMOCHE, H Performed at [facility], [address] [place], [place] [address]   Lactic acid, plasma     Status: Abnormal   Collection Time: 03/19/19  6:22 PM  Result Value Ref Range   Lactic Acid, Venous 2.0 (HH) 0.5 - 1.9 mmol/L    Comment: CRITICAL RESULT CALLED TO, READ BACK BY AND VERIFIED WITH: Sheilah Pigeon RN @ 1916 ON 03/19/2019 BY TEMOCHE, H Performed at [facility], [place] [address], [place], [place] [address]    Ct Angio Head W Or Wo Contrast  Result Date: 03/19/2019 CLINICAL DATA:  Left-sided weakness. EXAM: CT ANGIOGRAPHY HEAD AND NECK TECHNIQUE: Multidetector CT imaging of the head and neck was performed using the standard protocol during bolus administration of intravenous contrast. Multiplanar CT image reconstructions and MIPs were obtained to evaluate the vascular anatomy. Carotid stenosis measurements (when applicable) are obtained utilizing NASCET criteria, using the distal internal carotid diameter as the denominator.  CONTRAST:  43mL OMNIPAQUE IOHEXOL 350 MG/ML SOLN COMPARISON:  CT head without contrast 03/19/2019 FINDINGS: CTA NECK FINDINGS Aortic arch: A 3 vessel arch configuration is present. Mild atherosclerotic changes are noted at the aortic arch. There is no significant stenosis or aneurysm. Right carotid system: The right common carotid artery is within normal limits. Calcifications are present at the origin of the right ICA. There is mild tortuosity in the right ICA without a significant stenosis. Left carotid system: The left common carotid artery is within normal limits. Atherosclerotic changes are noted in the proximal left ICA without a significant stenosis. There is mild tortuosity of the cervical left ICA without stenosis. Vertebral arteries: The left vertebral artery is the dominant vessel. Both vertebral arteries originate from the subclavian arteries without significant stenoses. There is focal calcification at the origin of the right vertebral artery. There is no significant stenosis of either vertebral artery in the neck. Skeleton: Multilevel endplate degenerative changes are present C3-4 through C6-7. Uncovertebral spurring contributes to foraminal narrowing on the right at C4-5 and C5-6 on the left at C3-4. Marrow signal and vertebral body  heights are normal. Other neck: Soft tissues the neck are otherwise unremarkable. No focal mucosal or submucosal lesions are present. Salivary glands are within normal limits. No significant adenopathy is present. Thyroid is normal. Upper chest: There is fluid within a mildly dilated esophagus. Lung apices are clear. Thoracic inlet is within normal limits. Review of the MIP images confirms the above findings CTA HEAD FINDINGS Anterior circulation: Atherosclerotic calcifications are present in the cavernous internal carotid arteries bilaterally without a significant stenosis relative to the more distal vessel. The ICA termini are normal bilaterally. The left A1 segment is  aplastic. The anterior communicating artery is patent. Both A2 segments fill from the right. The M1 segments are normal bilaterally. MCA bifurcations are intact. There is some attenuation of distal MCA branch vessels bilaterally. No significant proximal stenosis or occlusion is present. Posterior circulation: The left vertebral artery is the dominant vessel. PICA origins are visualized and normal. Vertebrobasilar junction is normal. The basilar artery is normal. The left posterior cerebral artery is of fetal type. The right posterior cerebral artery originates from the basilar tip. The PCA branch vessels demonstrate distal narrowing bilaterally. No significant proximal stenosis or occlusion is present. Venous sinuses: Dural sinuses are patent. Straight sinus deep cerebral veins are intact. Cortical veins are unremarkable. Anatomic variants: Fetal type left posterior cerebral artery. Delayed phase: The delayed images demonstrate subtle enhancement of the posterior left frontal mass. No other focal lesions are present. The lesion measures 2.6 x 2.5 x 2.8 cm. No other focal lesions are present. There is no infarct on the postcontrast images. Review of the MIP images confirms the above findings IMPRESSION: 1. No significant large vessel occlusion. 2. Atherosclerotic changes at the carotid bifurcations and cavernous internal carotid arteries bilaterally without significant stenosis. 3. Moderate attenuation of distal branch vessels in the anterior and posterior circulation without a significant proximal stenosis or occlusion within the circle of [place]. 4. 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 Metastatic disease is considered less likely. MRI the brain without and with contrast would be useful for further evaluation. Electronically Signed   By: San Morelle M.D.   On: 03/19/2019 16:30   Ct Angio Neck W Or Wo Contrast  Result Date: 03/19/2019 CLINICAL DATA:   Left-sided weakness. EXAM: CT ANGIOGRAPHY HEAD AND NECK TECHNIQUE: Multidetector CT imaging of the head and neck was performed using the standard protocol during bolus administration of intravenous contrast. Multiplanar CT image reconstructions and MIPs were obtained to evaluate the vascular anatomy. Carotid stenosis measurements (when applicable) are obtained utilizing NASCET criteria, using the distal internal carotid diameter as the denominator. CONTRAST:  86mL OMNIPAQUE IOHEXOL 350 MG/ML SOLN COMPARISON:  CT head without contrast 03/19/2019 FINDINGS: CTA NECK FINDINGS Aortic arch: A 3 vessel arch configuration is present. Mild atherosclerotic changes are noted at the aortic arch. There is no significant stenosis or aneurysm. Right carotid system: The right common carotid artery is within normal limits. Calcifications are present at the origin of the right ICA. There is mild tortuosity in the right ICA without a significant stenosis. Left carotid system: The left common carotid artery is within normal limits. Atherosclerotic changes are noted in the proximal left ICA without a significant stenosis. There is mild tortuosity of the cervical left ICA without stenosis. Vertebral arteries: The left vertebral artery is the dominant vessel. Both vertebral arteries originate from the subclavian arteries without significant stenoses. There is focal calcification at the origin of the right vertebral  artery. There is no significant stenosis of either vertebral artery in the neck. Skeleton: Multilevel endplate degenerative changes are present C3-4 through C6-7. Uncovertebral spurring contributes to foraminal narrowing on the right at C4-5 and C5-6 on the left at C3-4. Marrow signal and vertebral body heights are normal. Other neck: Soft tissues the neck are otherwise unremarkable. No focal mucosal or submucosal lesions are present. Salivary glands are within normal limits. No significant adenopathy is present. Thyroid is  normal. Upper chest: There is fluid within a mildly dilated esophagus. Lung apices are clear. Thoracic inlet is within normal limits. Review of the MIP images confirms the above findings CTA HEAD FINDINGS Anterior circulation: Atherosclerotic calcifications are present in the cavernous internal carotid arteries bilaterally without a significant stenosis relative to the more distal vessel. The ICA termini are normal bilaterally. The left A1 segment is aplastic. The anterior communicating artery is patent. Both A2 segments fill from the right. The M1 segments are normal bilaterally. MCA bifurcations are intact. There is some attenuation of distal MCA branch vessels bilaterally. No significant proximal stenosis or occlusion is present. Posterior circulation: The left vertebral artery is the dominant vessel. PICA origins are visualized and normal. Vertebrobasilar junction is normal. The basilar artery is normal. The left posterior cerebral artery is of fetal type. The right posterior cerebral artery originates from the basilar tip. The PCA branch vessels demonstrate distal narrowing bilaterally. No significant proximal stenosis or occlusion is present. Venous sinuses: Dural sinuses are patent. Straight sinus deep cerebral veins are intact. Cortical veins are unremarkable. Anatomic variants: Fetal type left posterior cerebral artery. Delayed phase: The delayed images demonstrate subtle enhancement of the posterior left frontal mass. No other focal lesions are present. The lesion measures 2.6 x 2.5 x 2.8 cm. No other focal lesions are present. There is no infarct on the postcontrast images. Review of the MIP images confirms the above findings IMPRESSION: 1. No significant large vessel occlusion. 2. Atherosclerotic changes at the carotid bifurcations and cavernous internal carotid arteries bilaterally without significant stenosis. 3. Moderate attenuation of distal branch vessels in the anterior and posterior circulation  without a significant proximal stenosis or occlusion within the circle of [place]. 4. 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 Metastatic disease is considered less likely. MRI the brain without and with contrast would be useful for further evaluation. Electronically Signed   By: San Morelle M.D.   On: 03/19/2019 16:30   Ct Angio Chest Pe W/cm &/or Wo Cm  Result Date: 03/19/2019 CLINICAL DATA:  Concern for pulmonary embolism. EXAM: CT ANGIOGRAPHY CHEST WITH CONTRAST TECHNIQUE: Multidetector CT imaging of the chest was performed using the standard protocol during bolus administration of intravenous contrast. Multiplanar CT image reconstructions and MIPs were obtained to evaluate the vascular anatomy. CONTRAST:  67mL OMNIPAQUE IOHEXOL 350 MG/ML SOLN COMPARISON:  None. FINDINGS: Cardiovascular: The CTA is timed such that the aorta is opacified while the pulmonary arteries are poorly opacified. The this may in part be a secondary to patient condition as well as small contrast bolus. There is no definitive filling defects within the pulmonary arteries although poorly evaluated as above. No evidence of RIGHT ventricular strain. Coronary artery calcification and aortic atherosclerotic calcification. Aorta is normal. Mediastinum/Nodes: No axillary supraclavicular adenopathy. No mediastinal hilar adenopathy. No pericardial effusion. Lungs/Pleura: No pulmonary infarction.  No pleural fluid. Upper Abdomen: Limited view of the liver, kidneys, pancreas are unremarkable. Normal adrenal glands. Musculoskeletal: No aggressive osseous lesion. Review  of the MIP images confirms the above findings. IMPRESSION: 1. No gross evidence acute pulmonary embolism on suboptimal exam. Consider anticoagulation and repeat exam when patient's renal function improves. 2. Coronary artery calcification and Aortic Atherosclerosis (ICD10-I70.0). 3. No pulmonary infarction or pneumonia.  Electronically Signed   By: Suzy Bouchard M.D.   On: 03/19/2019 16:18   Mr Jeri Cos AY Contrast  Result Date: 03/19/2019 CLINICAL DATA:  Abnormal CT of the head. Patient collapsed after a run. EXAM: MRI HEAD WITHOUT AND WITH CONTRAST TECHNIQUE: Multiplanar, multiecho pulse sequences of the brain and surrounding structures were obtained without and with intravenous contrast. CONTRAST:  9 mL Gadavist COMPARISON:  CT head without contrast 03/19/2019 FINDINGS: Brain: Infiltrating mass is present in the left superior frontal gyrus. Tumor measures 2.6 x 4.2 x 3.5 cm. There is subtle enhancement associated with the lesion. The most significant enhancement is superiorly along the dura. There is no significant cystic component. There is focal cortical thickening and increased T2 signal in the posterior cingulate gyrus, best seen on images 17 and 18 of series 13 and image 12 of series 21. There is no enhancement associated with this area. No other focal lesions are present. Asymmetric T2 hyperintensity is present within the medial left temporal lobe and hippocampus. Mild periventricular T2 hyperintensities are otherwise within normal limits for age. The ventricles are of normal size. No significant extraaxial fluid collection is present. Vascular: Normal flow voids. Skull and upper cervical spine: Insert normal skull Sinuses/Orbits: Mild mucosal thickening is present in the ethmoid air cells bilaterally. Polyps or mucous retention cysts are noted inferiorly in the maxillary sinuses. The mastoid air cells are clear. Globes and orbits are within normal limits. IMPRESSION: 1. Infiltrating tumor mass in the left superior frontal gyrus measures 2.6 x 4.2 x 3.5 cm. There is subtle enhancement. Focal enhancement is associated with the dura. 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 The differential diagnosis includes oligodendroglioma. Ganglioglioma or DNET could have a similar appearance but are atypical in this  age group. Metastatic disease is considered unlikely with this appearance. 2. Additional focal area of T2 hyperintensity and cortical thickening posteriorly in the cingulate gyrus may represent a second area of tumor. 3. Subtle T2 hyperintensity in the medial left temporal lobe could be related to seizure activity. Electronically Signed   By: San Morelle M.D.   On: 03/19/2019 18:39   Mr Cervical Spine Wo Contrast  Result Date: 03/19/2019 CLINICAL DATA:  Cervical spine trauma, myelopathy. Collapsed on floor after coming home from a run. EXAM: MRI CERVICAL SPINE WITHOUT CONTRAST TECHNIQUE: Multiplanar, multisequence MR imaging of the cervical spine was performed. No intravenous contrast was administered. COMPARISON:  CTA of the neck 03/19/2019 FINDINGS: Alignment: Slight retrolisthesis is evident at C3-4. Slight anterolisthesis is present at C7-T1, T1-2, T2-3, and T3-4. AP alignment is otherwise anatomic. Vertebrae: Edematous endplate marrow changes are noted posteriorly at C4-5. Chronic fatty endplate marrow changes are present at C3-4, C5-6, and C6-7. Cord: Normal signal and morphology are present in the cervical and upper thoracic spinal cord to the lowest imaged level, T3-4. Posterior Fossa, vertebral arteries, paraspinal tissues: Craniocervical junction is normal. Flow is present in the vertebral arteries bilaterally. Visualized intracranial contents are normal. Disc levels: C2-3: Mild uncovertebral spurring is present on the right. There is no significant stenosis. C3-4: A broad-based disc osteophyte complex is present. There is partial effacement of the ventral CSF. Uncovertebral and facet spurring contribute to moderate foraminal narrowing, left greater than right. C4-5:  A broad-based disc osteophyte complex is present. There is effacement of ventral CSF and slight distortion of ventral surface the cord. The canal is narrowed to 8.5 mm. Severe right and moderate left foraminal narrowing is due to  uncovertebral and facet disease. C5-6: A broad-based disc osteophyte complex present. There is effacement of ventral scratched at there is partial effacement ventral CSF. Moderate foraminal stenosis due to uncovertebral and facet disease is worse left than right. C6-7: A leftward disc osteophyte complex is present. There is effacement of the ventral CSF. Moderate left and mild right foraminal narrowing is present. C7-T1: Moderate facet hypertrophy is worse left than right. There is no significant disc herniation or stenosis. IMPRESSION: 1. No acute abnormality. 2. Multilevel degenerative disc disease as described. 3. Mild central and moderate bilateral foraminal narrowing at C3-4 is worse left than right. 4. 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 This is the most severe level. This is also the only level with edematous endplate marrow changes, suggesting the most recent progression. 5. Mild central and moderate bilateral foraminal stenosis at C5-6 is worse left than right. 6. Moderate left and mild right foraminal narrowing at C6-7. 7. Moderate facet hypertrophy at C7-T1 is worse on the left. There is no significant focal stenosis. Electronically Signed   By: San Morelle M.D.   On: 03/19/2019 17:53   Ct Head Code Stroke Wo Contrast  Result Date: 03/19/2019 CLINICAL DATA:  Code stroke.  Left-sided weakness. EXAM: CT HEAD WITHOUT CONTRAST TECHNIQUE: Contiguous axial images were obtained from the base of the skull through the vertex without intravenous contrast. COMPARISON:  None. FINDINGS: Brain: No acute infarct is present. The insular ribbon is normal bilaterally. Basal ganglia are intact. There is a mass lesion in the high posterior left frontal lobe. Lesion blends with the gray matter. It measures approximately 2.6 x 2.5 x 3.0 cm. The tumor extends nearly to the corpus callosum. No other definite lesions are present. No significant white matter  lesions are present. The ventricles are of normal size. The brainstem and cerebellum are within normal limits. No significant extraaxial fluid collection is present. Vascular: No hyperdense vessel or unexpected calcification. Skull: Insert normal calvarium no focal lytic or blastic lesions are present. Sinuses/Orbits: The left ethmoid air cell is opacified. Polyps or mucous retention cysts are present along the inferior maxillary sinuses. The remaining paranasal sinuses and mastoid air cells are otherwise clear. The globes and orbits are within normal limits. ASPECTS [facility] Stroke Program Early CT Score) - Ganglionic level infarction (caudate, lentiform nuclei, internal capsule, insula, M1-M3 cortex): 7/7 - Supraganglionic infarction (M4-M6 cortex): 3/3 Total score (0-10 with 10 being normal): 10/10 IMPRESSION: 1. No acute infarct. 2. Mass lesion in the posterior high left frontal lobe may involve the primary motor cortex. 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 Metastatic disease should also be considered. Consider MRI the brain without and with contrast for further evaluation. 3. ASPECTS is 10/10 These results were called by telephone at the time of interpretation on 03/19/2019 at 3:31 pm to Dr. Leonel Ramsay , who verbally acknowledged these results. Electronically Signed   By: San Morelle M.D.   On: 03/19/2019 15:35    Pending Labs Unresulted Labs (From admission, onward)    Start     Ordered   03/19/19 1739  Novel Coronavirus,NAA,(SEND-OUT TO REF LAB - TAT 24-48 hrs); Hosp Order  (Asymptomatic Patients Labs)  Once,  STAT    Question:  Rule Out  Answer:  Yes   03/19/19 1739   Signed and Held  Magnesium  Tomorrow morning,   R     Signed and Held   Signed and Held  Basic metabolic panel  Tomorrow morning,   R     Signed and Held   Signed and Held  CBC  Tomorrow morning,   R     Signed and Held   Signed and Held  Protime-INR  Once,   R     Signed  and Held   Signed and Held  APTT  Once,   R     Signed and Held          Vitals/Pain Today's Vitals   03/19/19 1700 03/19/19 1830 03/19/19 1900 03/19/19 1930  BP: (!) 169/87 (!) 168/85 (!) 165/88 (!) 166/93  Pulse: 96 88 85   Resp: 12 12 16 19   Temp:      TempSrc:      SpO2: 96% 97% 97%   Weight:      Height:      PainSc:        Isolation Precautions No active isolations  Medications Medications  sodium chloride 0.9 % bolus 500 mL (0 mLs Intravenous Stopped 03/19/19 1653)    Followed by  0.9 %  sodium chloride infusion (1,000 mLs Intravenous New Bag/Given 03/19/19 1555)  levETIRAcetam (KEPPRA) IVPB 500 mg/100 mL premix (has no administration in time range)  sodium chloride flush (NS) 0.9 % injection 3 mL (3 mLs Intravenous Given 03/19/19 1555)  potassium chloride 10 mEq in 100 mL IVPB (0 mEq Intravenous Stopped 03/19/19 1702)  iohexol (OMNIPAQUE) 350 MG/ML injection 100 mL (50 mLs Intravenous Contrast Given 03/19/19 1547)  iohexol (OMNIPAQUE) 350 MG/ML injection 50 mL (50 mLs Intravenous Contrast Given 03/19/19 1555)  sodium chloride 0.9 % bolus 1,000 mL (1,000 mLs Intravenous New Bag/Given 03/19/19 1827)  potassium chloride 10 mEq in 100 mL IVPB (0 mEq Intravenous Stopped 03/19/19 1930)  levETIRAcetam (KEPPRA) IVPB 1000 mg/100 mL premix (0 mg Intravenous Stopped 03/19/19 1903)  gadobutrol (GADAVIST) 1 MMOL/ML injection 9 mL (9 mLs Intravenous Contrast Given 03/19/19 1805)    Mobility walks High fall risk   Focused Assessments Neuro Assessment Handoff:  Swallow screen pass? No  Cardiac Rhythm: Sinus tachycardia NIH Stroke Scale ( + Modified Stroke Scale Criteria)  Level of Consciousness (1a.)   : Alert, keenly responsive LOC Questions (1b. )   +: Answers one question correctly LOC Commands (1c. )   + : Performs both tasks correctly Best Gaze (2. )  +: Normal Visual (3. )  +: No visual loss Facial Palsy (4. )    : Normal symmetrical movements Motor Arm, Left (5a. )   +:  No drift Motor Arm, Right (5b. )   +: No drift Motor Leg, Left (6a. )   +: Drift Motor Leg, Right (6b. )   +: No drift Limb Ataxia (7. ): Present in one limb Sensory (8. )   +: Normal, no sensory loss Best Language (9. )   +: Mild-to-moderate aphasia Dysarthria (10. ): Normal Extinction/Inattention (11.)   +: No Abnormality Modified SS Total  +: 3 Complete NIHSS TOTAL: 4 Last date known well: 03/19/19 Last time known well: 1315 Neuro Assessment:   Neuro Checks:      Last Documented NIHSS Modified Score: 3 (03/19/19 1525) Has TPA been given? No If patient is a Neuro Trauma and patient is  going to OR before floor call report to [place] nurse: [phone] or [phone]     R Recommendations: See Admitting Provider Note  Report given to:   Additional Notes:

## 2019-03-19 NOTE — ED Notes (Signed)
Oxygen saturation 100% on room air at this time. C collar remains in place, pt alert.
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
## 2019-03-19 NOTE — H&P (Addendum)
History and Physical    Brent Coleman JOA:[id number] DOB: 11-02-1940 DOA: 03/19/2019  Referring MD/NP/PA:   PCP: Colon Branch, MD   Patient coming from:  The patient is coming from home.  At baseline, pt is independent for most of ADL.        Chief Complaint: unresponsiveness  HPI: Brent Coleman is a 78 y.o. male with medical history significant of hypertension, hyperlipidemia, GERD, CKD- 2, prostate cancer (radiation therapy 13 years ago), who presents with unresponsiveness.   Per report, pt was in his normal state of health until around 1:15 PM today. He suddenly began "shaking" after did some exercise. The total time shaking was approximately 15 seconds. He did cycling about 6 miles and then had been walking around. He got a glass of soda with ice and then was sitting at the table and drinking it, his symptoms started. Per report, pt slumped to the side hitting his head on the chair.  His head then fell forward with his chin resting on the table. Per EMS report, pt was found pt agonal, cyanotic and unresponsive. Pt was noted to have left side weakness which has gradually improved on arrival to ED. Pt was bagged for 3-4 mins, and became responsive. EMS also reported that I had 10 beats of V-tach. When I saw pt, he is alter and oriented x 3. He does not remember exactly what happened to him.  He denies unilateral weakness, numbness or tingling his.  No facial droop or slurred speech.  Patient does not have chest pain, shortness of breath, cough.  No fever or chills.  No nausea vomiting, diarrhea, abdominal pain, symptoms of UTI.  ED Course: pt was found to have WBC 8.9, lactic acid 2.0, INR 1.1, pending COVID-19 test, potassium 3.4, stable renal function, temperature normal, initially tachycardia with heart rate of 113, then 85, no tachypnea, oxygen saturation 96% on room air, blood pressure 165/88. CTA of chest showed no gross evidence acute pulmonary embolism on suboptimal exam. CTA of  head and neck showed no LOV. CT of head showed a mass lesion in the posterior high left frontal lobe. MRI of neck showed degenerative disc disease and foraminal narrowing. Pt is admitted to stepdown as inpatient.  EDP consulted Dr. Leonel Ramsay for neurology, Dr. Charissa Bash of cardiology. Neurosurgery will also be consulted by EDP.  # MRI of brain: 1. Infiltrating tumor mass in the left superior frontal gyrus measures 2.6 x 4.2 x 3.5 cm. There is subtle enhancement. Focal enhancement is associated with the dura. 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 The differential diagnosis includes oligodendroglioma. Ganglioglioma or DNET could have a similar appearance but are atypical in this age group. Metastatic disease is considered unlikely with this appearance. 2. Additional focal area of T2 hyperintensity and cortical thickening posteriorly in the cingulate gyrus may represent a second area of tumor. 3. Subtle T2 hyperintensity in the medial left temporal lobe could be related to seizure activity.  Review of Systems:   General: no fevers, chills, no body weight gain, has fatigue HEENT: no blurry vision, hearing changes or sore throat Respiratory: no dyspnea, coughing, wheezing CV: no chest pain, no palpitations GI: no nausea, vomiting, abdominal pain, diarrhea, constipation GU: no dysuria, burning on urination, increased urinary frequency, hematuria  Ext: no leg edema Neuro: had left sided weakness, unresponsivenessno vision change or hearing loss Skin: no rash. Has skin abrasion in left face MSK: No muscle spasm, no deformity, no limitation of range of movement  in spin Heme: No easy bruising.  Travel history: No recent long distant travel.  Allergy:  Allergies  Allergen Reactions   Doxazosin Mesylate Other (See Comments)    REACTION: Abdominal pain    Past Medical History:  Diagnosis Date   Basal cell carcinoma    sees derm q year   Erectile dysfunction due to arterial  insufficiency    moderate-patient not interested in treatment   Gallstone    GERD (gastroesophageal reflux disease)    Hyperlipidemia    Hypertension    Malignant neoplasm of prostate ([facility]) ~ 2007   sees urology q year    Past Surgical History:  Procedure Laterality Date   POLYPECTOMY     TONSILLECTOMY  1949    Social History:  reports that he has never smoked. He has never used smokeless tobacco. He reports current alcohol use. He reports that he does not use drugs.  Family History:  Family History  Problem Relation Age of Onset   Colon cancer Mother 41   Colon cancer Father 53       colon   Hypertension Other    Stroke Other    CAD Neg Hx    Prostate cancer Neg Hx    Diabetes Neg Hx      Prior to Admission medications   Medication Sig Start Date End Date Taking? Authorizing Provider  aspirin 81 MG tablet Take 81 mg by mouth daily.    [provider]  lisinopril (ZESTRIL) 20 MG tablet Take 1 tablet (20 mg total) by mouth daily. 02/11/19   Colon Branch, MD  Multiple Vitamins-Minerals (MULTIVITAMIN WITH MINERALS) tablet Take 1 tablet by mouth daily.      [provider]  ranitidine (ZANTAC) 150 MG capsule Take 150 mg by mouth every evening.      [provider]    Physical Exam: Vitals:   03/19/19 1900 03/19/19 1930 03/19/19 2000 03/19/19 2106  BP: (!) 165/88 (!) 166/93 (!) 171/92 (!) 153/76  Pulse: 85   81  Resp: 16 19 14 18   Temp:    (!) 97.5 F (36.4 C)  TempSrc:      SpO2: 97%   98%  Weight:      Height:       General: Not in acute distress HEENT:       Eyes: PERRL, EOMI, no scleral icterus.       ENT: No discharge from the ears and nose, no pharynx injection, no tonsillar enlargement.        Neck: No JVD, no bruit, no mass felt. Heme: No neck lymph node enlargement. Cardiac: S1/S2, RRR, No murmurs, No gallops or rubs. Respiratory: No rales, wheezing, rhonchi or rubs. GI: Soft, nondistended, nontender, no rebound  pain, no organomegaly, BS present. GU: No hematuria Ext: No pitting leg edema bilaterally. 2+DP/PT pulse bilaterally. Musculoskeletal: No joint deformities, No joint redness or warmth, no limitation of ROM in spin. Skin: No rashes. Has skin abrasion left face Neuro: pt is drowsy, but oriented X3, cranial nerves II-XII grossly intact, moves all extremities normally. Muscle strength 5/5 in all extremities, sensation to light touch intact. Brachial reflex 2+ bilaterally. Negative Babinski's sign.  Psych: Patient is not psychotic, no suicidal or hemocidal ideation.  Labs on Admission: I have personally reviewed following labs and imaging studies  CBC: Recent Labs  Lab 03/19/19 1505 03/19/19 1509  WBC 8.9  --   NEUTROABS 5.2  --   HGB 15.5 14.3  HCT 43.8  42.0  MCV 95.8  --   PLT 160  --    Basic Metabolic Panel: Recent Labs  Lab 03/19/19 1505 03/19/19 1509  NA 137 137  K 3.4* 3.4*  CL 106 105  CO2 14*  --   GLUCOSE 154* 157*  BUN 23 25*  CREATININE 1.69* 1.50*  CALCIUM 8.7*  --    GFR: Estimated Creatinine Clearance: 43.1 mL/min (A) (by C-G formula based on SCr of 1.5 mg/dL (H)). Liver Function Tests: Recent Labs  Lab 03/19/19 1505  AST 26  ALT 19  ALKPHOS 49  BILITOT 2.0*  PROT 5.6*  ALBUMIN 3.5   No results for input(s): LIPASE, AMYLASE in the last 168 hours. No results for input(s): AMMONIA in the last 168 hours. Coagulation Profile: Recent Labs  Lab 03/19/19 1505 03/19/19 2100  INR 1.1 1.1   Cardiac Enzymes: Recent Labs  Lab 03/19/19 1505  CKTOTAL 172  TROPONINI <0.03   BNP (last 3 results) No results for input(s): PROBNP in the last 8760 hours. HbA1C: No results for input(s): HGBA1C in the last 72 hours. CBG: Recent Labs  Lab 03/19/19 1504  GLUCAP 152*   Lipid Profile: No results for input(s): CHOL, HDL, LDLCALC, TRIG, CHOLHDL, LDLDIRECT in the last 72 hours. Thyroid Function Tests: No results for input(s): TSH, T4TOTAL, FREET4, T3FREE,  THYROIDAB in the last 72 hours. Anemia Panel: No results for input(s): VITAMINB12, FOLATE, FERRITIN, TIBC, IRON, RETICCTPCT in the last 72 hours. Urine analysis:    Component Value Date/Time   COLORURINE YELLOW 11/09/2009 [place] 11/09/2009 1355   LABSPEC 1.012 11/09/2009 1355   PHURINE 6.0 11/09/2009 1355   GLUCOSEU NEGATIVE 11/09/2009 1355   GLUCOSEU NEGATIVE 01/08/2009 0931   HGBUR NEGATIVE 11/09/2009 1355   BILIRUBINUR Negative 02/02/2017 1123   KETONESUR NEGATIVE 11/09/2009 1355   PROTEINUR Negative 02/02/2017 1123   PROTEINUR NEGATIVE 11/09/2009 1355   UROBILINOGEN 0.2 02/02/2017 1123   UROBILINOGEN 0.2 11/09/2009 1355   NITRITE Negative 02/02/2017 1123   NITRITE NEGATIVE 11/09/2009 1355   LEUKOCYTESUR Negative 02/02/2017 1123   Sepsis Labs: @LABRCNTIP (procalcitonin:4,lacticidven:4) )No results found for this or any previous visit (from the past 240 hour(s)).   Radiological Exams on Admission: Ct Angio Head W Or Wo Contrast  Result Date: 03/19/2019 CLINICAL DATA:  Left-sided weakness. EXAM: CT ANGIOGRAPHY HEAD AND NECK TECHNIQUE: Multidetector CT imaging of the head and neck was performed using the standard protocol during bolus administration of intravenous contrast. Multiplanar CT image reconstructions and MIPs were obtained to evaluate the vascular anatomy. Carotid stenosis measurements (when applicable) are obtained utilizing NASCET criteria, using the distal internal carotid diameter as the denominator. CONTRAST:  67mL OMNIPAQUE IOHEXOL 350 MG/ML SOLN COMPARISON:  CT head without contrast 03/19/2019 FINDINGS: CTA NECK FINDINGS Aortic arch: A 3 vessel arch configuration is present. Mild atherosclerotic changes are noted at the aortic arch. There is no significant stenosis or aneurysm. Right carotid system: The right common carotid artery is within normal limits. Calcifications are present at the origin of the right ICA. There is mild tortuosity in the right  ICA without a significant stenosis. Left carotid system: The left common carotid artery is within normal limits. Atherosclerotic changes are noted in the proximal left ICA without a significant stenosis. There is mild tortuosity of the cervical left ICA without stenosis. Vertebral arteries: The left vertebral artery is the dominant vessel. Both vertebral arteries originate from the subclavian arteries without significant stenoses. There is focal calcification at the origin of  the right vertebral artery. There is no significant stenosis of either vertebral artery in the neck. Skeleton: Multilevel endplate degenerative changes are present C3-4 through C6-7. Uncovertebral spurring contributes to foraminal narrowing on the right at C4-5 and C5-6 on the left at C3-4. Marrow signal and vertebral body heights are normal. Other neck: Soft tissues the neck are otherwise unremarkable. No focal mucosal or submucosal lesions are present. Salivary glands are within normal limits. No significant adenopathy is present. Thyroid is normal. Upper chest: There is fluid within a mildly dilated esophagus. Lung apices are clear. Thoracic inlet is within normal limits. Review of the MIP images confirms the above findings CTA HEAD FINDINGS Anterior circulation: Atherosclerotic calcifications are present in the cavernous internal carotid arteries bilaterally without a significant stenosis relative to the more distal vessel. The ICA termini are normal bilaterally. The left A1 segment is aplastic. The anterior communicating artery is patent. Both A2 segments fill from the right. The M1 segments are normal bilaterally. MCA bifurcations are intact. There is some attenuation of distal MCA branch vessels bilaterally. No significant proximal stenosis or occlusion is present. Posterior circulation: The left vertebral artery is the dominant vessel. PICA origins are visualized and normal. Vertebrobasilar junction is normal. The basilar artery is  normal. The left posterior cerebral artery is of fetal type. The right posterior cerebral artery originates from the basilar tip. The PCA branch vessels demonstrate distal narrowing bilaterally. No significant proximal stenosis or occlusion is present. Venous sinuses: Dural sinuses are patent. Straight sinus deep cerebral veins are intact. Cortical veins are unremarkable. Anatomic variants: Fetal type left posterior cerebral artery. Delayed phase: The delayed images demonstrate subtle enhancement of the posterior left frontal mass. No other focal lesions are present. The lesion measures 2.6 x 2.5 x 2.8 cm. No other focal lesions are present. There is no infarct on the postcontrast images. Review of the MIP images confirms the above findings IMPRESSION: 1. No significant large vessel occlusion. 2. Atherosclerotic changes at the carotid bifurcations and cavernous internal carotid arteries bilaterally without significant stenosis. 3. Moderate attenuation of distal branch vessels in the anterior and posterior circulation without a significant proximal stenosis or occlusion within the circle of Willis. 4. 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 Metastatic disease is considered less likely. MRI the brain without and with contrast would be useful for further evaluation. Electronically Signed   By: San Morelle M.D.   On: 03/19/2019 16:30   Ct Angio Neck W Or Wo Contrast  Result Date: 03/19/2019 CLINICAL DATA:  Left-sided weakness. EXAM: CT ANGIOGRAPHY HEAD AND NECK TECHNIQUE: Multidetector CT imaging of the head and neck was performed using the standard protocol during bolus administration of intravenous contrast. Multiplanar CT image reconstructions and MIPs were obtained to evaluate the vascular anatomy. Carotid stenosis measurements (when applicable) are obtained utilizing NASCET criteria, using the distal internal carotid diameter as the denominator.  CONTRAST:  72mL OMNIPAQUE IOHEXOL 350 MG/ML SOLN COMPARISON:  CT head without contrast 03/19/2019 FINDINGS: CTA NECK FINDINGS Aortic arch: A 3 vessel arch configuration is present. Mild atherosclerotic changes are noted at the aortic arch. There is no significant stenosis or aneurysm. Right carotid system: The right common carotid artery is within normal limits. Calcifications are present at the origin of the right ICA. There is mild tortuosity in the right ICA without a significant stenosis. Left carotid system: The left common carotid artery is within normal limits. Atherosclerotic changes are noted in the proximal  left ICA without a significant stenosis. There is mild tortuosity of the cervical left ICA without stenosis. Vertebral arteries: The left vertebral artery is the dominant vessel. Both vertebral arteries originate from the subclavian arteries without significant stenoses. There is focal calcification at the origin of the right vertebral artery. There is no significant stenosis of either vertebral artery in the neck. Skeleton: Multilevel endplate degenerative changes are present C3-4 through C6-7. Uncovertebral spurring contributes to foraminal narrowing on the right at C4-5 and C5-6 on the left at C3-4. Marrow signal and vertebral body heights are normal. Other neck: Soft tissues the neck are otherwise unremarkable. No focal mucosal or submucosal lesions are present. Salivary glands are within normal limits. No significant adenopathy is present. Thyroid is normal. Upper chest: There is fluid within a mildly dilated esophagus. Lung apices are clear. Thoracic inlet is within normal limits. Review of the MIP images confirms the above findings CTA HEAD FINDINGS Anterior circulation: Atherosclerotic calcifications are present in the cavernous internal carotid arteries bilaterally without a significant stenosis relative to the more distal vessel. The ICA termini are normal bilaterally. The left A1 segment is  aplastic. The anterior communicating artery is patent. Both A2 segments fill from the right. The M1 segments are normal bilaterally. MCA bifurcations are intact. There is some attenuation of distal MCA branch vessels bilaterally. No significant proximal stenosis or occlusion is present. Posterior circulation: The left vertebral artery is the dominant vessel. PICA origins are visualized and normal. Vertebrobasilar junction is normal. The basilar artery is normal. The left posterior cerebral artery is of fetal type. The right posterior cerebral artery originates from the basilar tip. The PCA branch vessels demonstrate distal narrowing bilaterally. No significant proximal stenosis or occlusion is present. Venous sinuses: Dural sinuses are patent. Straight sinus deep cerebral veins are intact. Cortical veins are unremarkable. Anatomic variants: Fetal type left posterior cerebral artery. Delayed phase: The delayed images demonstrate subtle enhancement of the posterior left frontal mass. No other focal lesions are present. The lesion measures 2.6 x 2.5 x 2.8 cm. No other focal lesions are present. There is no infarct on the postcontrast images. Review of the MIP images confirms the above findings IMPRESSION: 1. No significant large vessel occlusion. 2. Atherosclerotic changes at the carotid bifurcations and cavernous internal carotid arteries bilaterally without significant stenosis. 3. Moderate attenuation of distal branch vessels in the anterior and posterior circulation without a significant proximal stenosis or occlusion within the circle of Willis. 4. 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 Metastatic disease is considered less likely. MRI the brain without and with contrast would be useful for further evaluation. Electronically Signed   By: San Morelle M.D.   On: 03/19/2019 16:30   Ct Angio Chest Pe W/cm &/or Wo Cm  Result Date: 03/19/2019 CLINICAL  DATA:  Concern for pulmonary embolism. EXAM: CT ANGIOGRAPHY CHEST WITH CONTRAST TECHNIQUE: Multidetector CT imaging of the chest was performed using the standard protocol during bolus administration of intravenous contrast. Multiplanar CT image reconstructions and MIPs were obtained to evaluate the vascular anatomy. CONTRAST:  20mL OMNIPAQUE IOHEXOL 350 MG/ML SOLN COMPARISON:  None. FINDINGS: Cardiovascular: The CTA is timed such that the aorta is opacified while the pulmonary arteries are poorly opacified. The this may in part be a secondary to patient condition as well as small contrast bolus. There is no definitive filling defects within the pulmonary arteries although poorly evaluated as above. No evidence of RIGHT ventricular strain. Coronary  artery calcification and aortic atherosclerotic calcification. Aorta is normal. Mediastinum/Nodes: No axillary supraclavicular adenopathy. No mediastinal hilar adenopathy. No pericardial effusion. Lungs/Pleura: No pulmonary infarction.  No pleural fluid. Upper Abdomen: Limited view of the liver, kidneys, pancreas are unremarkable. Normal adrenal glands. Musculoskeletal: No aggressive osseous lesion. Review of the MIP images confirms the above findings. IMPRESSION: 1. No gross evidence acute pulmonary embolism on suboptimal exam. Consider anticoagulation and repeat exam when patient's renal function improves. 2. Coronary artery calcification and Aortic Atherosclerosis (ICD10-I70.0). 3. No pulmonary infarction or pneumonia. Electronically Signed   By: Suzy Bouchard M.D.   On: 03/19/2019 16:18   Mr Jeri Cos OI Contrast  Result Date: 03/19/2019 CLINICAL DATA:  Abnormal CT of the head. Patient collapsed after a run. EXAM: MRI HEAD WITHOUT AND WITH CONTRAST TECHNIQUE: Multiplanar, multiecho pulse sequences of the brain and surrounding structures were obtained without and with intravenous contrast. CONTRAST:  9 mL Gadavist COMPARISON:  CT head without contrast 03/19/2019  FINDINGS: Brain: Infiltrating mass is present in the left superior frontal gyrus. Tumor measures 2.6 x 4.2 x 3.5 cm. There is subtle enhancement associated with the lesion. The most significant enhancement is superiorly along the dura. There is no significant cystic component. There is focal cortical thickening and increased T2 signal in the posterior cingulate gyrus, best seen on images 17 and 18 of series 13 and image 12 of series 21. There is no enhancement associated with this area. No other focal lesions are present. Asymmetric T2 hyperintensity is present within the medial left temporal lobe and hippocampus. Mild periventricular T2 hyperintensities are otherwise within normal limits for age. The ventricles are of normal size. No significant extraaxial fluid collection is present. Vascular: Normal flow voids. Skull and upper cervical spine: Insert normal skull Sinuses/Orbits: Mild mucosal thickening is present in the ethmoid air cells bilaterally. Polyps or mucous retention cysts are noted inferiorly in the maxillary sinuses. The mastoid air cells are clear. Globes and orbits are within normal limits. IMPRESSION: 1. Infiltrating tumor mass in the left superior frontal gyrus measures 2.6 x 4.2 x 3.5 cm. There is subtle enhancement. Focal enhancement is associated with the dura. 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 The differential diagnosis includes oligodendroglioma. Ganglioglioma or DNET could have a similar appearance but are atypical in this age group. Metastatic disease is considered unlikely with this appearance. 2. Additional focal area of T2 hyperintensity and cortical thickening posteriorly in the cingulate gyrus may represent a second area of tumor. 3. Subtle T2 hyperintensity in the medial left temporal lobe could be related to seizure activity. Electronically Signed   By: San Morelle M.D.   On: 03/19/2019 18:39   Mr Cervical Spine Wo Contrast  Result Date:  03/19/2019 CLINICAL DATA:  Cervical spine trauma, myelopathy. Collapsed on floor after coming home from a run. EXAM: MRI CERVICAL SPINE WITHOUT CONTRAST TECHNIQUE: Multiplanar, multisequence MR imaging of the cervical spine was performed. No intravenous contrast was administered. COMPARISON:  CTA of the neck 03/19/2019 FINDINGS: Alignment: Slight retrolisthesis is evident at C3-4. Slight anterolisthesis is present at C7-T1, T1-2, T2-3, and T3-4. AP alignment is otherwise anatomic. Vertebrae: Edematous endplate marrow changes are noted posteriorly at C4-5. Chronic fatty endplate marrow changes are present at C3-4, C5-6, and C6-7. Cord: Normal signal and morphology are present in the cervical and upper thoracic spinal cord to the lowest imaged level, T3-4. Posterior Fossa, vertebral arteries, paraspinal tissues: Craniocervical junction is normal. Flow is present in the vertebral arteries bilaterally. Visualized intracranial  contents are normal. Disc levels: C2-3: Mild uncovertebral spurring is present on the right. There is no significant stenosis. C3-4: A broad-based disc osteophyte complex is present. There is partial effacement of the ventral CSF. Uncovertebral and facet spurring contribute to moderate foraminal narrowing, left greater than right. C4-5: A broad-based disc osteophyte complex is present. There is effacement of ventral CSF and slight distortion of ventral surface the cord. The canal is narrowed to 8.5 mm. Severe right and moderate left foraminal narrowing is due to uncovertebral and facet disease. C5-6: A broad-based disc osteophyte complex present. There is effacement of ventral scratched at there is partial effacement ventral CSF. Moderate foraminal stenosis due to uncovertebral and facet disease is worse left than right. C6-7: A leftward disc osteophyte complex is present. There is effacement of the ventral CSF. Moderate left and mild right foraminal narrowing is present. C7-T1: Moderate facet  hypertrophy is worse left than right. There is no significant disc herniation or stenosis. IMPRESSION: 1. No acute abnormality. 2. Multilevel degenerative disc disease as described. 3. Mild central and moderate bilateral foraminal narrowing at C3-4 is worse left than right. 4. 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 This is the most severe level. This is also the only level with edematous endplate marrow changes, suggesting the most recent progression. 5. Mild central and moderate bilateral foraminal stenosis at C5-6 is worse left than right. 6. Moderate left and mild right foraminal narrowing at C6-7. 7. Moderate facet hypertrophy at C7-T1 is worse on the left. There is no significant focal stenosis. Electronically Signed   By: San Morelle M.D.   On: 03/19/2019 17:53   Ct Head Code Stroke Wo Contrast  Result Date: 03/19/2019 CLINICAL DATA:  Code stroke.  Left-sided weakness. EXAM: CT HEAD WITHOUT CONTRAST TECHNIQUE: Contiguous axial images were obtained from the base of the skull through the vertex without intravenous contrast. COMPARISON:  None. FINDINGS: Brain: No acute infarct is present. The insular ribbon is normal bilaterally. Basal ganglia are intact. There is a mass lesion in the high posterior left frontal lobe. Lesion blends with the gray matter. It measures approximately 2.6 x 2.5 x 3.0 cm. The tumor extends nearly to the corpus callosum. No other definite lesions are present. No significant white matter lesions are present. The ventricles are of normal size. The brainstem and cerebellum are within normal limits. No significant extraaxial fluid collection is present. Vascular: No hyperdense vessel or unexpected calcification. Skull: Insert normal calvarium no focal lytic or blastic lesions are present. Sinuses/Orbits: The left ethmoid air cell is opacified. Polyps or mucous retention cysts are present along the inferior maxillary  sinuses. The remaining paranasal sinuses and mastoid air cells are otherwise clear. The globes and orbits are within normal limits. ASPECTS [facility] Stroke Program Early CT Score) - Ganglionic level infarction (caudate, lentiform nuclei, internal capsule, insula, M1-M3 cortex): 7/7 - Supraganglionic infarction (M4-M6 cortex): 3/3 Total score (0-10 with 10 being normal): 10/10 IMPRESSION: 1. No acute infarct. 2. Mass lesion in the posterior high left frontal lobe may involve the primary motor cortex. 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 Metastatic disease should also be considered. Consider MRI the brain without and with contrast for further evaluation. 3. ASPECTS is 10/10 These results were called by telephone at the time of interpretation on 03/19/2019 at 3:31 pm to Dr. Leonel Ramsay , who verbally acknowledged these results. Electronically Signed   By: Harrell Gave  Mattern M.D.   On: 03/19/2019 15:35     EKG: Independently reviewed.  Sinus rhythm, QTC 442, early R wave progression, nonspecific T wave change.  Assessment/Plan Principal Problem:   Unresponsive Active Problems:   Essential hypertension   Chronic renal insufficiency, stage II (mild)   GERD (gastroesophageal reflux disease)   Brain mass   V tach (HCC)   Hypokalemia   Seizure (HCC)   Unresponsive due to possible seizure 2/2 to brain mass: Patient's unresponsiveness is likely due to seizure as per neurologist, Dr. Cecil Cobbs evaluation. This is most likely 2/2 to brain mass He had one episode of 10 beat V. tach, which may have also contributed partially.  Neurosurgery was consulted.   -will admit to [facility] as inpt -seizure precaution -Highly appreciate Dr. Cecil Cobbs consultation and follow-up recommendations as follows Recommendations: 1) Keppra 1 g x 1 followed by 500 mg twice daily 2) EEG 3) MRI brain with and without contrast-->showed brain mass 4) MRI cervical spine-->showed  degenerative disc disease and oforaminal narrowing -Follow-up neurosurgeon's recommendation, which is highly appreciated.  HTN:  -Continue home medications: Lisinopril -IV hydralazine prn  Chronic renal insufficiency, stage II (mild):  Baseline creatinine 1.3-1.5.  His creatinine is 1.69, BUN 25, close to baseline -f/u BMP  GERD (gastroesophageal reflux disease): -prn TUMS  Hypokalemia: K= 3.4 on admission. - Repleted - Check Mg level  V- tach ([place]): per EMS report, pt had 10-beat V-tach.  Patient denies chest pain, shortness of breath. Trop negative. EKG showed regular sinus rhythm currently.  Cardiology, Dr. Charissa Bash was consulted--> recommended 2D echo. "If this is normal, probably no further evaluation is necessary unless he has recurrent episodes of syncope.  In that case would recommend a Holter or event monitor.  If his echocardiogram is abnormal, he may need further testing" -highly appreciate Dr. Deloris Ping recommendations -will get 2d echo. -pt is on ASA    Inpatient status:  # Patient requires inpatient status due to high intensity of service, high risk for further deterioration and high frequency of surveillance required.  I certify that at the point of admission it is my clinical judgment that the patient will require inpatient hospital care spanning beyond 2 midnights from the point of admission.   This patient has multiple chronic comorbidities including hypertension, hyperlipidemia, GERD, CKD- 2, prostate cancer (radiation therapy 13 years ago).  Now patient has presenting with unresponsiveness, possible seizure found to have brain mass, V. tach  The initial radiographic and laboratory data are worrisome because of brain mass, V. tach,  Current medical needs: please see my assessment and plan  Predictability of an adverse outcome (risk): Patient has multiple comorbidities, now presents with unresponsiveness, possible seizure, brain mass and V. Tach.  His  presentation is highly complicated, will need to have further work-up, given his old age, he is at high risk of deteriorating.  Patient will need to be treated in hospital for at least 2 days.     DVT ppx: SCD Code Status: Full code Family Communication: None at bed side. Disposition Plan:  Anticipate discharge back to previous home environment Consults called:  Dr. Leonel Ramsay for neurology, Dr. Charissa Bash of cardiology, neurosurgery will be consulted. Admission status: SDU/inpation       Date of Service 03/19/2019    Ivor Costa [facility]   If 7PM-7AM, please contact night-coverage [email or web address] Password [facility] 03/19/2019, 9:39 PM

## 2019-03-19 NOTE — Consult Note (Signed)
Neurology Consultation Reason for Consult: Left-sided weakness Referring Physician: Theodis Sato  CC: Unresponsiveness  History is obtained from: Patient, wife  HPI: Brent Coleman is a 78 y.o. male with a history of hypertension, hyperlipidemia who was in his normal state of health until around 115.  He had come in from cycling about 6 miles and then had been walking around and was in his normal state of health.  He got a glass of soda with ice and then was sitting at the table and drinking it when he suddenly began "shaking."  She is unable to tell me if he had arms outstretched or bent, unable to tell me if 1 side was affected more than the other, and did not notice which way his eyes were going.  He slumped to the side hitting his head on the chair.  His head then fell forward with his chin resting on the table.  He then continued to fall to the floor.  By the time he got to the floor, he was no longer shaking.  The total time shaking was approximately 15 seconds.  On EMS arrival, they noted that his left side was completely plegic.  It gradually improved over the 20 to 30 minutes of transport and he was moving both sides relatively well on arrival.  LKW: 1:15 PM tpa given?: no, intracranial mass    ROS:  Unable to obtain due to altered mental status.   Past Medical History:  Diagnosis Date  . Basal cell carcinoma    sees derm q year  . 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 Gallstone   . GERD (gastroesophageal reflux disease)   . Hyperlipidemia   . Hypertension   . Malignant neoplasm of prostate [facility]) ~ 2007   sees urology q year     Family History  Problem Relation Age of Onset  . Colon cancer Mother 43  . Colon cancer Father 78       colon  . Hypertension Other   . Stroke Other   . CAD Neg Hx   . Prostate cancer Neg Hx   . Diabetes Neg Hx      Social History:  reports that he has never smoked. He  has never used smokeless tobacco. He reports current alcohol use. He reports that he does not use drugs.   Exam: Current vital signs: BP (!) 163/83   Pulse 93   Temp 97.8 F (36.6 C) (Oral)   Resp 12   Ht 6' (1.829 m)   Wt 91.3 kg   SpO2 100%   BMI 27.30 kg/m  Vital signs in last 24 hours: Temp:  [97.8 F (36.6 C)] 97.8 F (36.6 C) (06/13 1549) Pulse Rate:  [93-113] 93 (06/13 1645) Resp:  [12-17] 12 (06/13 1645) BP: (148-170)/(75-91) 163/83 (06/13 1645) SpO2:  [98 %-100 %] 100 % (06/13 1645) Weight:  [91.3 kg] 91.3 kg (06/13 1545)   Physical Exam  Constitutional: Appears well-developed and well-nourished.  Psych: Affect appropriate to situation Eyes: No scleral injection HENT: No OP obstrucion Head: Normocephalic.  Cardiovascular: Normal rate and regular rhythm.  Respiratory: Effort normal, non-labored breathing GI: Soft.  No distension. There is no tenderness.  Skin: WDI  Neuro: Mental Status: Patient is awake, but confused, he does appear to gradually improve over the course of my encounter with the patient.  He initially may have been extinguishing to double simultaneous stimulation, but due to the confusion  I am not certain his answers were a level 1 where the other. Cranial Nerves: II: Visual Fields are full. Pupils are equal, round, and reactive to light.   III,IV, VI: EOMI without ptosis or diploplia.  V: Facial sensation is symmetric to temperature VII: Facial movement is symmetric.  VIII: hearing is intact to voice X: Uvula elevates symmetrically XI: Shoulder shrug is symmetric. XII: tongue is midline without atrophy or fasciculations.  Motor: Tone is normal. Bulk is normal. 5/5 strength was present in bilateral arms, he does not cooperate very well with either lower extremity but he is able to lift them against gravity bilaterally. Sensory: Sensation is symmetric to light touch and temperature in the arms and legs. Cerebellar: He does not cooperate  well   I have reviewed labs in epic and the results pertinent to this consultation are: Creatinine 1.69  I have reviewed the images obtained: CT head-left frontal mass, CTA-no large vessel occlusion  Impression: 78 year old male with what I suspect is new onset seizure.  The left-sided weakness following the initial seizure would be unusual given the location of the mass.  He does appear to be improving.  The short duration of the shaking and lack of clear epileptic features (possibly due to poor recall on the witnesses part) doing that convulsive syncope is certainly a possibility.  He did have V. tach on route which could be contributory, or the fact that he was drinking a cold liquid could bring up the possibility of a vagal event.  Given that the location of weakness is unusual, I would also favor assessing the cervical spine with an MRI to make sure he did not cause injury when he fell.  That being said with a brain mass which is likely tumor, I do think that I would favor starting antiepileptics at this time.  Recommendations: 1) Keppra 1 g x 1 followed by 500 mg twice daily 2) EEG 3) MRI brain with and without contrast 4) MRI cervical spine   Roland Rack, MD [facility] [phone]  If 7pm- 7am, please page neurology on call as listed in [place].

## 2019-03-19 NOTE — Consult Note (Addendum)
Cardiology Consultation:   Patient ID: Brent Coleman; [id number]; 16-Apr-1941   Admit date: 03/19/2019 Date of Consult: 03/19/2019  Primary Care Provider: Colon Branch, MD Primary Cardiologist: No primary care provider on file. Primary Electrophysiologist:  None  Chief Complaint: unresponsiveness  Patient Profile:   78 year old man with history of hypertension, hyperlipidemia who presents with an episode of unresponsiveness earlier today.  History of Present Illness:   Patient reports being very healthy and active with no symptoms recently.  He went outside cycling today with no issues.  He was sitting down at the table when he feet began to feel strange like things were going into "slow motion", and he fell to the floor.  It is unclear if he lost consciousness.  His wife was near him and reports seeing him shaking, and then appearing unresponsive.  EMS was called.  When they arrived he had a pulse but was confused and had left-sided paralysis.  MS reportedly saw 10 beats of a wide-complex tachycardia at some point.  He arrived in the ED and a stroke code was called.  At that point his paralysis was improving.  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  Labs notable for troponin less than 0.03, creatinine 1.7, lactate 7.7.  EKG sinus tachycardia, a single PVC, no evidence of ischemia or conduction system disease.  He has an injury in the left side of his face in the fall.  He denies a history of syncope, palpitations, chest pain, or dyspnea.  He has no cardiac history.  Past Medical History:  Diagnosis Date   Basal cell carcinoma    sees derm q year   Erectile dysfunction due to arterial insufficiency    moderate-patient not interested in treatment   Gallstone    GERD (gastroesophageal reflux disease)    Hyperlipidemia    Hypertension    Malignant neoplasm of prostate ([place]) ~ 2007   sees urology q  year    Past Surgical History:  Procedure Laterality Date   POLYPECTOMY     TONSILLECTOMY  1949     Inpatient Medications: Scheduled Meds:  Continuous Infusions:  sodium chloride 1,000 mL (03/19/19 1555)   levETIRAcetam 1,000 mg (03/19/19 1841)   [START ON 03/20/2019] levETIRAcetam     potassium chloride 10 mEq (03/19/19 1825)   PRN Meds:   Home Meds: Prior to Admission medications   Medication Sig Start Date End Date Taking? Authorizing Provider  aspirin 81 MG tablet Take 81 mg by mouth daily.    [provider]  lisinopril (ZESTRIL) 20 MG tablet Take 1 tablet (20 mg total) by mouth daily. 02/11/19   Colon Branch, MD  Multiple Vitamins-Minerals (MULTIVITAMIN WITH MINERALS) tablet Take 1 tablet by mouth daily.      [provider]  ranitidine (ZANTAC) 150 MG capsule Take 150 mg by mouth every evening.      [provider]    Allergies:    Allergies  Allergen Reactions   Doxazosin Mesylate     REACTION: Abdominal pain    Social History:   Social History   Socioeconomic History   Marital status: Married    Spouse name: Not on file   Number of children: 2   Years of education: Not on file   Highest education level: Not on file  Occupational History   Occupation: retired--  Scientist, product/process development strain: Not on file  Food insecurity    Worry: Not on file    Inability: Not on file   Transportation needs    Medical: Not on file    Non-medical: Not on file  Tobacco Use   Smoking status: Never Smoker   Smokeless tobacco: Never Used  Substance and Sexual Activity   Alcohol use: Yes   Drug use: No   Sexual activity: Yes    Partners: Female  Lifestyle   Physical activity    Days per week: Not on file    Minutes per session: Not on file   Stress: Not on file  Relationships   Social connections    Talks on phone: Not on file    Gets together: Not on file    Attends religious service: Not on file     Active member of club or organization: Not on file    Attends meetings of clubs or organizations: Not on file    Relationship status: Not on file   Intimate partner violence    Fear of current or ex partner: Not on file    Emotionally abused: Not on file    Physically abused: Not on file    Forced sexual activity: Not on file  Other Topics Concern   Not on file  Social History Narrative   HSG. College grad.    Married/ marriage is in good health. 2 Daughters. 2 grandchildren living in [place].    Work - retired from [employer] @ 33 and has enjoyed his retirement.          Family History:   The patient's family history includes Colon cancer (age of onset: 80) in his father and mother; Hypertension in an other family member; Stroke in an other family member. There is no history of CAD, Prostate cancer, or Diabetes.  ROS:  Please see the history of present illness.  All other ROS reviewed and negative.     Physical Exam/Data:   Vitals:   03/19/19 1600 03/19/19 1615 03/19/19 1630 03/19/19 1645  BP: (!) 154/75 (!) 148/75 (!) 161/77 (!) 163/83  Pulse: (!) 101 98 97 93  Resp: 13   12  Temp:      TempSrc:      SpO2: 100% 100% 99% 100%  Weight:      Height:       No intake or output data in the 24 hours ending 03/19/19 1842 Last 3 Weights 03/19/2019 10/19/2017 03/18/2017  Weight (lbs) 201 lb 4.5 oz 201 lb 4 oz 197 lb  Weight (kg) 91.3 kg 91.286 kg 89.359 kg    Body mass index is 27.3 kg/m.  General: Trauma on the left side of the face Remainder of the exam not completed due to uncertain COVID status  EKG: See HPI  Relevant CV Studies: None  Laboratory Data:  Chemistry Recent Labs  Lab 03/19/19 1505 03/19/19 1509  NA 137 137  K 3.4* 3.4*  CL 106 105  CO2 14*  --   GLUCOSE 154* 157*  BUN 23 25*  CREATININE 1.69* 1.50*  CALCIUM 8.7*  --   GFRNONAA 38*  --   GFRAA 43*  --   ANIONGAP 17*  --     Recent Labs  Lab 03/19/19 1505  PROT 5.6*  ALBUMIN 3.5  AST 26    ALT 19  ALKPHOS 49  BILITOT 2.0*   Hematology Recent Labs  Lab 03/19/19 1505 03/19/19 1509  WBC 8.9  --   RBC 4.57  --  HGB 15.5 14.3  HCT 43.8 42.0  MCV 95.8  --   MCH 33.9  --   MCHC 35.4  --   RDW 11.8  --   PLT 160  --    Cardiac Enzymes Recent Labs  Lab 03/19/19 1505  TROPONINI <0.03   No results for input(s): TROPIPOC in the last 168 hours.  BNPNo results for input(s): BNP, PROBNP in the last 168 hours.  DDimer  Recent Labs  Lab 03/19/19 1505  DDIMER 0.80*    Radiology/Studies:  Ct Angio Head W Or Wo Contrast  Result Date: 03/19/2019 CLINICAL DATA:  Left-sided weakness. EXAM: CT ANGIOGRAPHY HEAD AND NECK TECHNIQUE: Multidetector CT imaging of the head and neck was performed using the standard protocol during bolus administration of intravenous contrast. Multiplanar CT image reconstructions and MIPs were obtained to evaluate the vascular anatomy. Carotid stenosis measurements (when applicable) are obtained utilizing NASCET criteria, using the distal internal carotid diameter as the denominator. CONTRAST:  63mL OMNIPAQUE IOHEXOL 350 MG/ML SOLN COMPARISON:  CT head without contrast 03/19/2019 FINDINGS: CTA NECK FINDINGS Aortic arch: A 3 vessel arch configuration is present. Mild atherosclerotic changes are noted at the aortic arch. There is no significant stenosis or aneurysm. Right carotid system: The right common carotid artery is within normal limits. Calcifications are present at the origin of the right ICA. There is mild tortuosity in the right ICA without a significant stenosis. Left carotid system: The left common carotid artery is within normal limits. Atherosclerotic changes are noted in the proximal left ICA without a significant stenosis. There is mild tortuosity of the cervical left ICA without stenosis. Vertebral arteries: The left vertebral artery is the dominant vessel. Both vertebral arteries originate from the subclavian arteries without significant  stenoses. There is focal calcification at the origin of the right vertebral artery. There is no significant stenosis of either vertebral artery in the neck. Skeleton: Multilevel endplate degenerative changes are present C3-4 through C6-7. Uncovertebral spurring contributes to foraminal narrowing on the right at C4-5 and C5-6 on the left at C3-4. Marrow signal and vertebral body heights are normal. Other neck: Soft tissues the neck are otherwise unremarkable. No focal mucosal or submucosal lesions are present. Salivary glands are within normal limits. No significant adenopathy is present. Thyroid is normal. Upper chest: There is fluid within a mildly dilated esophagus. Lung apices are clear. Thoracic inlet is within normal limits. Review of the MIP images confirms the above findings CTA HEAD FINDINGS Anterior circulation: Atherosclerotic calcifications are present in the cavernous internal carotid arteries bilaterally without a significant stenosis relative to the more distal vessel. The ICA termini are normal bilaterally. The left A1 segment is aplastic. The anterior communicating artery is patent. Both A2 segments fill from the right. The M1 segments are normal bilaterally. MCA bifurcations are intact. There is some attenuation of distal MCA branch vessels bilaterally. No significant proximal stenosis or occlusion is present. Posterior circulation: The left vertebral artery is the dominant vessel. PICA origins are visualized and normal. Vertebrobasilar junction is normal. The basilar artery is normal. The left posterior cerebral artery is of fetal type. The right posterior cerebral artery originates from the basilar tip. The PCA branch vessels demonstrate distal narrowing bilaterally. No significant proximal stenosis or occlusion is present. Venous sinuses: Dural sinuses are patent. Straight sinus deep cerebral veins are intact. Cortical veins are unremarkable. Anatomic variants: Fetal type left posterior cerebral  artery. Delayed phase: The delayed images demonstrate subtle enhancement of the posterior left frontal  mass. No other focal lesions are present. The lesion measures 2.6 x 2.5 x 2.8 cm. No other focal lesions are present. There is no infarct on the postcontrast images. Review of the MIP images confirms the above findings IMPRESSION: 1. No significant large vessel occlusion. 2. Atherosclerotic changes at the carotid bifurcations and cavernous internal carotid arteries bilaterally without significant stenosis. 3. Moderate attenuation of distal branch vessels in the anterior and posterior circulation without a significant proximal stenosis or occlusion within the circle of Willis. 4. 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 Metastatic disease is considered less likely. MRI the brain without and with contrast would be useful for further evaluation. Electronically Signed   By: San Morelle M.D.   On: 03/19/2019 16:30   Ct Angio Neck W Or Wo Contrast  Result Date: 03/19/2019 CLINICAL DATA:  Left-sided weakness. EXAM: CT ANGIOGRAPHY HEAD AND NECK TECHNIQUE: Multidetector CT imaging of the head and neck was performed using the standard protocol during bolus administration of intravenous contrast. Multiplanar CT image reconstructions and MIPs were obtained to evaluate the vascular anatomy. Carotid stenosis measurements (when applicable) are obtained utilizing NASCET criteria, using the distal internal carotid diameter as the denominator. CONTRAST:  72mL OMNIPAQUE IOHEXOL 350 MG/ML SOLN COMPARISON:  CT head without contrast 03/19/2019 FINDINGS: CTA NECK FINDINGS Aortic arch: A 3 vessel arch configuration is present. Mild atherosclerotic changes are noted at the aortic arch. There is no significant stenosis or aneurysm. Right carotid system: The right common carotid artery is within normal limits. Calcifications are present at the origin of the right ICA. There is  mild tortuosity in the right ICA without a significant stenosis. Left carotid system: The left common carotid artery is within normal limits. Atherosclerotic changes are noted in the proximal left ICA without a significant stenosis. There is mild tortuosity of the cervical left ICA without stenosis. Vertebral arteries: The left vertebral artery is the dominant vessel. Both vertebral arteries originate from the subclavian arteries without significant stenoses. There is focal calcification at the origin of the right vertebral artery. There is no significant stenosis of either vertebral artery in the neck. Skeleton: Multilevel endplate degenerative changes are present C3-4 through C6-7. Uncovertebral spurring contributes to foraminal narrowing on the right at C4-5 and C5-6 on the left at C3-4. Marrow signal and vertebral body heights are normal. Other neck: Soft tissues the neck are otherwise unremarkable. No focal mucosal or submucosal lesions are present. Salivary glands are within normal limits. No significant adenopathy is present. Thyroid is normal. Upper chest: There is fluid within a mildly dilated esophagus. Lung apices are clear. Thoracic inlet is within normal limits. Review of the MIP images confirms the above findings CTA HEAD FINDINGS Anterior circulation: Atherosclerotic calcifications are present in the cavernous internal carotid arteries bilaterally without a significant stenosis relative to the more distal vessel. The ICA termini are normal bilaterally. The left A1 segment is aplastic. The anterior communicating artery is patent. Both A2 segments fill from the right. The M1 segments are normal bilaterally. MCA bifurcations are intact. There is some attenuation of distal MCA branch vessels bilaterally. No significant proximal stenosis or occlusion is present. Posterior circulation: The left vertebral artery is the dominant vessel. PICA origins are visualized and normal. Vertebrobasilar junction is  normal. The basilar artery is normal. The left posterior cerebral artery is of fetal type. The right posterior cerebral artery originates from the basilar tip. The PCA branch vessels demonstrate distal narrowing bilaterally.  No significant proximal stenosis or occlusion is present. Venous sinuses: Dural sinuses are patent. Straight sinus deep cerebral veins are intact. Cortical veins are unremarkable. Anatomic variants: Fetal type left posterior cerebral artery. Delayed phase: The delayed images demonstrate subtle enhancement of the posterior left frontal mass. No other focal lesions are present. The lesion measures 2.6 x 2.5 x 2.8 cm. No other focal lesions are present. There is no infarct on the postcontrast images. Review of the MIP images confirms the above findings IMPRESSION: 1. No significant large vessel occlusion. 2. Atherosclerotic changes at the carotid bifurcations and cavernous internal carotid arteries bilaterally without significant stenosis. 3. Moderate attenuation of distal branch vessels in the anterior and posterior circulation without a significant proximal stenosis or occlusion within the circle of Willis. 4. 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 Metastatic disease is considered less likely. MRI the brain without and with contrast would be useful for further evaluation. Electronically Signed   By: San Morelle M.D.   On: 03/19/2019 16:30   Ct Angio Chest Pe W/cm &/or Wo Cm  Result Date: 03/19/2019 CLINICAL DATA:  Concern for pulmonary embolism. EXAM: CT ANGIOGRAPHY CHEST WITH CONTRAST TECHNIQUE: Multidetector CT imaging of the chest was performed using the standard protocol during bolus administration of intravenous contrast. Multiplanar CT image reconstructions and MIPs were obtained to evaluate the vascular anatomy. CONTRAST:  14mL OMNIPAQUE IOHEXOL 350 MG/ML SOLN COMPARISON:  None. FINDINGS: Cardiovascular: The CTA is timed  such that the aorta is opacified while the pulmonary arteries are poorly opacified. The this may in part be a secondary to patient condition as well as small contrast bolus. There is no definitive filling defects within the pulmonary arteries although poorly evaluated as above. No evidence of RIGHT ventricular strain. Coronary artery calcification and aortic atherosclerotic calcification. Aorta is normal. Mediastinum/Nodes: No axillary supraclavicular adenopathy. No mediastinal hilar adenopathy. No pericardial effusion. Lungs/Pleura: No pulmonary infarction.  No pleural fluid. Upper Abdomen: Limited view of the liver, kidneys, pancreas are unremarkable. Normal adrenal glands. Musculoskeletal: No aggressive osseous lesion. Review of the MIP images confirms the above findings. IMPRESSION: 1. No gross evidence acute pulmonary embolism on suboptimal exam. Consider anticoagulation and repeat exam when patient's renal function improves. 2. Coronary artery calcification and Aortic Atherosclerosis (ICD10-I70.0). 3. No pulmonary infarction or pneumonia. Electronically Signed   By: Suzy Bouchard M.D.   On: 03/19/2019 16:18   Mr Jeri Cos ID Contrast  Result Date: 03/19/2019 CLINICAL DATA:  Abnormal CT of the head. Patient collapsed after a run. EXAM: MRI HEAD WITHOUT AND WITH CONTRAST TECHNIQUE: Multiplanar, multiecho pulse sequences of the brain and surrounding structures were obtained without and with intravenous contrast. CONTRAST:  9 mL Gadavist COMPARISON:  CT head without contrast 03/19/2019 FINDINGS: Brain: Infiltrating mass is present in the left superior frontal gyrus. Tumor measures 2.6 x 4.2 x 3.5 cm. There is subtle enhancement associated with the lesion. The most significant enhancement is superiorly along the dura. There is no significant cystic component. There is focal cortical thickening and increased T2 signal in the posterior cingulate gyrus, best seen on images 17 and 18 of series 13 and image 12 of  series 21. There is no enhancement associated with this area. No other focal lesions are present. Asymmetric T2 hyperintensity is present within the medial left temporal lobe and hippocampus. Mild periventricular T2 hyperintensities are otherwise within normal limits for age. The ventricles are of normal size. No significant extraaxial fluid collection  is present. Vascular: Normal flow voids. Skull and upper cervical spine: Insert normal skull Sinuses/Orbits: Mild mucosal thickening is present in the ethmoid air cells bilaterally. Polyps or mucous retention cysts are noted inferiorly in the maxillary sinuses. The mastoid air cells are clear. Globes and orbits are within normal limits. IMPRESSION: 1. Infiltrating tumor mass in the left superior frontal gyrus measures 2.6 x 4.2 x 3.5 cm. There is subtle enhancement. Focal enhancement is associated with the dura. 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 The differential diagnosis includes oligodendroglioma. Ganglioglioma or DNET could have a similar appearance but are atypical in this age group. Metastatic disease is considered unlikely with this appearance. 2. Additional focal area of T2 hyperintensity and cortical thickening posteriorly in the cingulate gyrus may represent a second area of tumor. 3. Subtle T2 hyperintensity in the medial left temporal lobe could be related to seizure activity. Electronically Signed   By: San Morelle M.D.   On: 03/19/2019 18:39   Mr Cervical Spine Wo Contrast  Result Date: 03/19/2019 CLINICAL DATA:  Cervical spine trauma, myelopathy. Collapsed on floor after coming home from a run. EXAM: MRI CERVICAL SPINE WITHOUT CONTRAST TECHNIQUE: Multiplanar, multisequence MR imaging of the cervical spine was performed. No intravenous contrast was administered. COMPARISON:  CTA of the neck 03/19/2019 FINDINGS: Alignment: Slight retrolisthesis is evident at C3-4. Slight anterolisthesis is present at C7-T1, T1-2, T2-3, and  T3-4. AP alignment is otherwise anatomic. Vertebrae: Edematous endplate marrow changes are noted posteriorly at C4-5. Chronic fatty endplate marrow changes are present at C3-4, C5-6, and C6-7. Cord: Normal signal and morphology are present in the cervical and upper thoracic spinal cord to the lowest imaged level, T3-4. Posterior Fossa, vertebral arteries, paraspinal tissues: Craniocervical junction is normal. Flow is present in the vertebral arteries bilaterally. Visualized intracranial contents are normal. Disc levels: C2-3: Mild uncovertebral spurring is present on the right. There is no significant stenosis. C3-4: A broad-based disc osteophyte complex is present. There is partial effacement of the ventral CSF. Uncovertebral and facet spurring contribute to moderate foraminal narrowing, left greater than right. C4-5: A broad-based disc osteophyte complex is present. There is effacement of ventral CSF and slight distortion of ventral surface the cord. The canal is narrowed to 8.5 mm. Severe right and moderate left foraminal narrowing is due to uncovertebral and facet disease. C5-6: A broad-based disc osteophyte complex present. There is effacement of ventral scratched at there is partial effacement ventral CSF. Moderate foraminal stenosis due to uncovertebral and facet disease is worse left than right. C6-7: A leftward disc osteophyte complex is present. There is effacement of the ventral CSF. Moderate left and mild right foraminal narrowing is present. C7-T1: Moderate facet hypertrophy is worse left than right. There is no significant disc herniation or stenosis. IMPRESSION: 1. No acute abnormality. 2. Multilevel degenerative disc disease as described. 3. Mild central and moderate bilateral foraminal narrowing at C3-4 is worse left than right. 4. 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 This is the most severe level. This is also the only level with edematous  endplate marrow changes, suggesting the most recent progression. 5. Mild central and moderate bilateral foraminal stenosis at C5-6 is worse left than right. 6. Moderate left and mild right foraminal narrowing at C6-7. 7. Moderate facet hypertrophy at C7-T1 is worse on the left. There is no significant focal stenosis. Electronically Signed   By: San Morelle M.D.   On: 03/19/2019 17:53   Ct  Head Code Stroke Wo Contrast  Result Date: 03/19/2019 CLINICAL DATA:  Code stroke.  Left-sided weakness. EXAM: CT HEAD WITHOUT CONTRAST TECHNIQUE: Contiguous axial images were obtained from the base of the skull through the vertex without intravenous contrast. COMPARISON:  None. FINDINGS: Brain: No acute infarct is present. The insular ribbon is normal bilaterally. Basal ganglia are intact. There is a mass lesion in the high posterior left frontal lobe. Lesion blends with the gray matter. It measures approximately 2.6 x 2.5 x 3.0 cm. The tumor extends nearly to the corpus callosum. No other definite lesions are present. No significant white matter lesions are present. The ventricles are of normal size. The brainstem and cerebellum are within normal limits. No significant extraaxial fluid collection is present. Vascular: No hyperdense vessel or unexpected calcification. Skull: Insert normal calvarium no focal lytic or blastic lesions are present. Sinuses/Orbits: The left ethmoid air cell is opacified. Polyps or mucous retention cysts are present along the inferior maxillary sinuses. The remaining paranasal sinuses and mastoid air cells are otherwise clear. The globes and orbits are within normal limits. ASPECTS [facility] Stroke Program Early CT Score) - Ganglionic level infarction (caudate, lentiform nuclei, internal capsule, insula, M1-M3 cortex): 7/7 - Supraganglionic infarction (M4-M6 cortex): 3/3 Total score (0-10 with 10 being normal): 10/10 IMPRESSION: 1. No acute infarct. 2. Mass lesion in the posterior high left  frontal lobe may involve the primary motor cortex. 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 Metastatic disease should also be considered. Consider MRI the brain without and with contrast for further evaluation. 3. ASPECTS is 10/10 These results were called by telephone at the time of interpretation on 03/19/2019 at 3:31 pm to Dr. Leonel Ramsay , who verbally acknowledged these results. Electronically Signed   By: San Morelle M.D.   On: 03/19/2019 15:35    Assessment and Plan:   1.  Fall, episode of unresponsiveness Episode seems most consistent with a seizure.  He has a newly diagnosed left frontal lobe tumor and some radiologic evidence of possible recent seizure activity.  He had a what sounds like post ictal state and an elevated lactate, which can be seen after a seizure.  We cannot completely rule out syncope, although this degree of lactate elevation would be hard to explain by brief episode of normal syncope.  It is theoretically possible that he had an unstable rhythm such as ventricular tachycardia leading to hypoperfusion and significant lactic acidosis.  However, it seems very unlikely that cardiac arrest leading to this degree of lactate elevation would spontaneously resolve without resuscitation, and with a completely normal troponin and EKG.  Transient bradycardia usually does not cause lactic acidosis.  Would recommend getting an echocardiogram.  If this is normal, probably no further evaluation is necessary unless he has recurrent episodes of syncope.  In that case would recommend a Holter or event monitor.  If his echocardiogram is abnormal, he may need further testing.  --Recommend echocardiogram (ordered) --would monitor on telemetry for 24-48 hours   For questions or updates, please contact [place] Please consult [email or web address] for contact info under Cardiology/STEMI.    Liliane Bade, MD  03/19/2019 6:42 PM

## 2019-03-19 NOTE — ED Notes (Signed)
Patient transported to MRI 

## 2019-03-20 DIAGNOSIS — R4189 Other symptoms and signs involving cognitive functions and awareness: Secondary | ICD-10-CM

## 2019-03-20 LAB — CBC
HCT: 38.9 % — ABNORMAL LOW (ref 39.0–52.0)
Hemoglobin: 14.2 g/dL (ref 13.0–17.0)
MCH: 34.4 pg — ABNORMAL HIGH (ref 26.0–34.0)
MCHC: 36.5 g/dL — ABNORMAL HIGH (ref 30.0–36.0)
MCV: 94.2 fL (ref 80.0–100.0)
Platelets: 127 10*3/uL — ABNORMAL LOW (ref 150–400)
RBC: 4.13 MIL/uL — ABNORMAL LOW (ref 4.22–5.81)
RDW: 11.9 % (ref 11.5–15.5)
WBC: 8 10*3/uL (ref 4.0–10.5)
nRBC: 0 % (ref 0.0–0.2)

## 2019-03-20 LAB — BASIC METABOLIC PANEL
Anion gap: 7 (ref 5–15)
BUN: 18 mg/dL (ref 8–23)
CO2: 22 mmol/L (ref 22–32)
Calcium: 8.6 mg/dL — ABNORMAL LOW (ref 8.9–10.3)
Chloride: 109 mmol/L (ref 98–111)
Creatinine, Ser: 1.39 mg/dL — ABNORMAL HIGH (ref 0.61–1.24)
GFR calc Af Amer: 55 mL/min — ABNORMAL LOW (ref >60)
GFR calc non Af Amer: 48 mL/min — ABNORMAL LOW (ref >60)
Glucose, Bld: 109 mg/dL — ABNORMAL HIGH (ref 70–99)
Potassium: 3.9 mmol/L (ref 3.5–5.1)
Sodium: 138 mmol/L (ref 135–145)

## 2019-03-20 LAB — MAGNESIUM: Magnesium: 2 mg/dL (ref 1.7–2.4)

## 2019-03-20 MED ORDER — LEVETIRACETAM 500 MG PO TABS: 500.0000 mg | ORAL_TABLET | Freq: Two times a day (BID) | ORAL | 2 refills | Status: DC

## 2019-03-20 NOTE — Progress Notes (Signed)
Patient discharged per orders. Follow up appointments, medications, prescriptions discussed. Patient's prescription for Keppra sent to his pharmacy for pick up. Discussed when to call the doctor versus EMS. Discussed with patient no driving for 65-months due to seizure precautions. Patient verbalized understanding. Time allowed for questions/concerns. Patient denies pain at this time. Patient able to ambulate, dress self, pack up belongings independently. Patient left the unit via wheelchair accompanied by tech. Patient's wife in the lobby for pick up.  Soyla Dryer RN

## 2019-03-20 NOTE — Progress Notes (Signed)
PROGRESS NOTE    Brent Coleman  WEX:[id number] DOB: 02-23-1941 DOA: 03/19/2019 PCP: Colon Branch, MD  Brief Narrative: 78 y.o. male with medical history significant of hypertension, hyperlipidemia, GERD, CKD- 2, prostate cancer (radiation therapy 13 years ago), who presents with unresponsiveness.   Per report, pt was in his normal state of health until around 1:15 PM today. He suddenly began "shaking" after did some exercise. The total time shaking was approximately 15 seconds. He did cycling about 6 miles and then had been walking around. He got a glass of soda with ice and then was sitting at the table and drinking it, his symptoms started. Per report, pt slumped to the side hitting his head on the chair. His head then fell forward with his chin resting on the table. Per EMS report, pt was found pt agonal, cyanotic and unresponsive. Pt was noted to have left side weakness which has gradually improved on arrival to ED. Pt was bagged for 3-4 mins, and became responsive. EMS also reported that I had 10 beats of V-tach. When I saw pt, he is alter and oriented x 3. He does not remember exactly what happened to him.  He denies unilateral weakness, numbness or tingling his.  No facial droop or slurred speech.  Patient does not have chest pain, shortness of breath, cough.  No fever or chills.  No nausea vomiting, diarrhea, abdominal pain, symptoms of UTI.  ED Course: pt was found to have WBC 8.9, lactic acid 2.0, INR 1.1, pending COVID-19 test, potassium 3.4, stable renal function, temperature normal, initially tachycardia with heart rate of 113, then 85, no tachypnea, oxygen saturation 96% on room air, blood pressure 165/88. CTA of chest showed no gross evidence acute pulmonary embolism on suboptimal exam. CTA of head and neck showed no LOV. CT of head showed a mass lesion in the posterior high left frontal lobe. MRI of neck showed degenerative disc disease and foraminal narrowing. Pt is admitted to  stepdown as inpatient.  EDP consulted Dr. Leonel Ramsay for neurology, Dr. Charissa Bash of cardiology. Neurosurgery will also be consulted by EDP. Assessment & Plan:   Principal Problem:   Unresponsive Active Problems:   Essential hypertension   Chronic renal insufficiency, stage II (mild)   GERD (gastroesophageal reflux disease)   Brain mass   V tach (HCC)   Hypokalemia   Seizure ([place])   #1 brain mass patient admitted with unresponsiveness and shakiness.  He was also found to have 1 episode of 10 beat of V. tach by the EMS report.  Work-up showed that he has infiltrating tumor mass in the left superior frontal gyrus measuring 2.6 x 4.2 x 3.5 cm.  Patient seen by neurosurgery await their input.  Continue Keppra for seizure prophylaxis.  Keppra 500 twice daily.  MRI of the C-spine shows degenerative disc disease and foraminal narrowing.  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  Patient has had no further seizures since admission to hospital.  Continue Keppra.  Check EEG.  #3 V. tach no further episodes since admission.  This is per EMS report.  Patient denies any chest pain shortness of breath.  His cardiac enzymes are negative.  EKG shows sinus rhythm.  Echocardiogram ordered.  If echo is normal no further evaluation necessary per cardiology.  But if he does have recurrent episodes of syncope he will need a event monitor.  Continue aspirin.  K and mag normal.  #4 hypertension continue PRN hydralazine and lisinopril.  #  5 CKD stage II Baseline creatinine is 1.3-1.5.  Today his creatinine level is 1.39.  #6 hypokalemia resolved potassium 3.9.  #7 history of GERD.  Continue PPI.  #8 lactic acidosis admitting lactate level was 7.7 which is probably secondary to seizure activity.  This is come down to 2.0.   DVT ppx: SCD Code Status: Full code Family Communication: dw wife Lenna Sciara Disposition Plan:  Anticipate discharge back to previous home environment Consults called:   Dr. Leonel Ramsay for neurology, Dr. Charissa Bash of cardiology, neurosurgery will be consulted.      Estimated body mass index is 27.3 kg/m as calculated from the following:   Height as of this encounter: 6' (1.829 m).   Weight as of this encounter: 91.3 kg.   Subjective: Resting in bed in nad no new complaints no chest pain headache   Objective: Vitals:   03/19/19 2106 03/19/19 2336 03/20/19 0518 03/20/19 0742  BP: (!) 153/76 (!) 155/91 (!) 149/71 (!) 145/78  Pulse: 81 79 72 72  Resp: 18 18 18 18   Temp: (!) 97.5 F (36.4 C) 98.3 F (36.8 C) 98.2 F (36.8 C) 98.2 F (36.8 C)  TempSrc:    Oral  SpO2: 98% 97% 96% 94%  Weight:      Height:        Intake/Output Summary (Last 24 hours) at 03/20/2019 0824 Last data filed at 03/20/2019 0519 Gross per 24 hour  Intake 1262.28 ml  Output 1950 ml  Net -687.72 ml   Filed Weights   03/19/19 1545  Weight: 91.3 kg    Examination:  General exam: Appears calm and comfortable  Respiratory system: Clear to auscultation. Respiratory effort normal. Cardiovascular system: S1 & S2 heard, RRR. No JVD, murmurs, rubs, gallops or clicks. No pedal edema. Gastrointestinal system: Abdomen is nondistended, soft and nontender. No organomegaly or masses felt. Normal bowel sounds heard. Central nervous system: Awake alert and oriented Extremities: Symmetric 5 x 5 power. Skin: No rashes, lesions or ulcers Psychiatry: Judgement and insight appear normal. Mood & affect appropriate.     Data Reviewed: I have personally reviewed following labs and imaging studies  CBC: Recent Labs  Lab 03/19/19 1505 03/19/19 1509 03/20/19 0528  WBC 8.9  --  8.0  NEUTROABS 5.2  --   --   HGB 15.5 14.3 14.2  HCT 43.8 42.0 38.9*  MCV 95.8  --  94.2  PLT 160  --  884*   Basic Metabolic Panel: Recent Labs  Lab 03/19/19 1505 03/19/19 1509 03/20/19 0528  NA 137 137 138  K 3.4* 3.4* 3.9  CL 106 105 109  CO2 14*  --  22  GLUCOSE 154* 157* 109*  BUN 23 25*  18  CREATININE 1.69* 1.50* 1.39*  CALCIUM 8.7*  --  8.6*  MG  --   --  2.0   GFR: Estimated Creatinine Clearance: 46.5 mL/min (A) (by C-G formula based on SCr of 1.39 mg/dL (H)). Liver Function Tests: Recent Labs  Lab 03/19/19 1505  AST 26  ALT 19  ALKPHOS 49  BILITOT 2.0*  PROT 5.6*  ALBUMIN 3.5   No results for input(s): LIPASE, AMYLASE in the last 168 hours. No results for input(s): AMMONIA in the last 168 hours. Coagulation Profile: Recent Labs  Lab 03/19/19 1505 03/19/19 2100  INR 1.1 1.1   Cardiac Enzymes: Recent Labs  Lab 03/19/19 1505  CKTOTAL 172  TROPONINI <0.03   BNP (last 3 results) No results for input(s): PROBNP in the last 8760  hours. HbA1C: No results for input(s): HGBA1C in the last 72 hours. CBG: Recent Labs  Lab 03/19/19 1504  GLUCAP 152*   Lipid Profile: No results for input(s): CHOL, HDL, LDLCALC, TRIG, CHOLHDL, LDLDIRECT in the last 72 hours. Thyroid Function Tests: No results for input(s): TSH, T4TOTAL, FREET4, T3FREE, THYROIDAB in the last 72 hours. Anemia Panel: No results for input(s): VITAMINB12, FOLATE, FERRITIN, TIBC, IRON, RETICCTPCT in the last 72 hours. Sepsis Labs: Recent Labs  Lab 03/19/19 1522 03/19/19 1822  LATICACIDVEN 7.7* 2.0*    No results found for this or any previous visit (from the past 240 hour(s)).       Radiology Studies: Ct Angio Head W Or Wo Contrast  Result Date: 03/19/2019 CLINICAL DATA:  Left-sided weakness. EXAM: CT ANGIOGRAPHY HEAD AND NECK TECHNIQUE: Multidetector CT imaging of the head and neck was performed using the standard protocol during bolus administration of intravenous contrast. Multiplanar CT image reconstructions and MIPs were obtained to evaluate the vascular anatomy. Carotid stenosis measurements (when applicable) are obtained utilizing NASCET criteria, using the distal internal carotid diameter as the denominator. CONTRAST:  92mL OMNIPAQUE IOHEXOL 350 MG/ML SOLN COMPARISON:  CT  head without contrast 03/19/2019 FINDINGS: CTA NECK FINDINGS Aortic arch: A 3 vessel arch configuration is present. Mild atherosclerotic changes are noted at the aortic arch. There is no significant stenosis or aneurysm. Right carotid system: The right common carotid artery is within normal limits. Calcifications are present at the origin of the right ICA. There is mild tortuosity in the right ICA without a significant stenosis. Left carotid system: The left common carotid artery is within normal limits. Atherosclerotic changes are noted in the proximal left ICA without a significant stenosis. There is mild tortuosity of the cervical left ICA without stenosis. Vertebral arteries: The left vertebral artery is the dominant vessel. Both vertebral arteries originate from the subclavian arteries without significant stenoses. There is focal calcification at the origin of the right vertebral artery. There is no significant stenosis of either vertebral artery in the neck. Skeleton: Multilevel endplate degenerative changes are present C3-4 through C6-7. Uncovertebral spurring contributes to foraminal narrowing on the right at C4-5 and C5-6 on the left at C3-4. Marrow signal and vertebral body heights are normal. Other neck: Soft tissues the neck are otherwise unremarkable. No focal mucosal or submucosal lesions are present. Salivary glands are within normal limits. No significant adenopathy is present. Thyroid is normal. Upper chest: There is fluid within a mildly dilated esophagus. Lung apices are clear. Thoracic inlet is within normal limits. Review of the MIP images confirms the above findings CTA HEAD FINDINGS Anterior circulation: Atherosclerotic calcifications are present in the cavernous internal carotid arteries bilaterally without a significant stenosis relative to the more distal vessel. The ICA termini are normal bilaterally. The left A1 segment is aplastic. The anterior communicating artery is patent. Both A2  segments fill from the right. The M1 segments are normal bilaterally. MCA bifurcations are intact. There is some attenuation of distal MCA branch vessels bilaterally. No significant proximal stenosis or occlusion is present. Posterior circulation: The left vertebral artery is the dominant vessel. PICA origins are visualized and normal. Vertebrobasilar junction is normal. The basilar artery is normal. The left posterior cerebral artery is of fetal type. The right posterior cerebral artery originates from the basilar tip. The PCA branch vessels demonstrate distal narrowing bilaterally. No significant proximal stenosis or occlusion is present. Venous sinuses: Dural sinuses are patent. Straight sinus deep cerebral veins are intact. Cortical veins  are unremarkable. Anatomic variants: Fetal type left posterior cerebral artery. Delayed phase: The delayed images demonstrate subtle enhancement of the posterior left frontal mass. No other focal lesions are present. The lesion measures 2.6 x 2.5 x 2.8 cm. No other focal lesions are present. There is no infarct on the postcontrast images. Review of the MIP images confirms the above findings IMPRESSION: 1. No significant large vessel occlusion. 2. Atherosclerotic changes at the carotid bifurcations and cavernous internal carotid arteries bilaterally without significant stenosis. 3. Moderate attenuation of distal branch vessels in the anterior and posterior circulation without a significant proximal stenosis or occlusion within the circle of Willis. 4. 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 Metastatic disease is considered less likely. MRI the brain without and with contrast would be useful for further evaluation. Electronically Signed   By: San Morelle M.D.   On: 03/19/2019 16:30   Ct Angio Neck W Or Wo Contrast  Result Date: 03/19/2019 CLINICAL DATA:  Left-sided weakness. EXAM: CT ANGIOGRAPHY HEAD AND NECK  TECHNIQUE: Multidetector CT imaging of the head and neck was performed using the standard protocol during bolus administration of intravenous contrast. Multiplanar CT image reconstructions and MIPs were obtained to evaluate the vascular anatomy. Carotid stenosis measurements (when applicable) are obtained utilizing NASCET criteria, using the distal internal carotid diameter as the denominator. CONTRAST:  68mL OMNIPAQUE IOHEXOL 350 MG/ML SOLN COMPARISON:  CT head without contrast 03/19/2019 FINDINGS: CTA NECK FINDINGS Aortic arch: A 3 vessel arch configuration is present. Mild atherosclerotic changes are noted at the aortic arch. There is no significant stenosis or aneurysm. Right carotid system: The right common carotid artery is within normal limits. Calcifications are present at the origin of the right ICA. There is mild tortuosity in the right ICA without a significant stenosis. Left carotid system: The left common carotid artery is within normal limits. Atherosclerotic changes are noted in the proximal left ICA without a significant stenosis. There is mild tortuosity of the cervical left ICA without stenosis. Vertebral arteries: The left vertebral artery is the dominant vessel. Both vertebral arteries originate from the subclavian arteries without significant stenoses. There is focal calcification at the origin of the right vertebral artery. There is no significant stenosis of either vertebral artery in the neck. Skeleton: Multilevel endplate degenerative changes are present C3-4 through C6-7. Uncovertebral spurring contributes to foraminal narrowing on the right at C4-5 and C5-6 on the left at C3-4. Marrow signal and vertebral body heights are normal. Other neck: Soft tissues the neck are otherwise unremarkable. No focal mucosal or submucosal lesions are present. Salivary glands are within normal limits. No significant adenopathy is present. Thyroid is normal. Upper chest: There is fluid within a mildly dilated  esophagus. Lung apices are clear. Thoracic inlet is within normal limits. Review of the MIP images confirms the above findings CTA HEAD FINDINGS Anterior circulation: Atherosclerotic calcifications are present in the cavernous internal carotid arteries bilaterally without a significant stenosis relative to the more distal vessel. The ICA termini are normal bilaterally. The left A1 segment is aplastic. The anterior communicating artery is patent. Both A2 segments fill from the right. The M1 segments are normal bilaterally. MCA bifurcations are intact. There is some attenuation of distal MCA branch vessels bilaterally. No significant proximal stenosis or occlusion is present. Posterior circulation: The left vertebral artery is the dominant vessel. PICA origins are visualized and normal. Vertebrobasilar junction is normal. The basilar artery is normal. The left posterior cerebral  artery is of fetal type. The right posterior cerebral artery originates from the basilar tip. The PCA branch vessels demonstrate distal narrowing bilaterally. No significant proximal stenosis or occlusion is present. Venous sinuses: Dural sinuses are patent. Straight sinus deep cerebral veins are intact. Cortical veins are unremarkable. Anatomic variants: Fetal type left posterior cerebral artery. Delayed phase: The delayed images demonstrate subtle enhancement of the posterior left frontal mass. No other focal lesions are present. The lesion measures 2.6 x 2.5 x 2.8 cm. No other focal lesions are present. There is no infarct on the postcontrast images. Review of the MIP images confirms the above findings IMPRESSION: 1. No significant large vessel occlusion. 2. Atherosclerotic changes at the carotid bifurcations and cavernous internal carotid arteries bilaterally without significant stenosis. 3. Moderate attenuation of distal branch vessels in the anterior and posterior circulation without a significant proximal stenosis or occlusion within  the circle of Willis. 4. 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 Metastatic disease is considered less likely. MRI the brain without and with contrast would be useful for further evaluation. Electronically Signed   By: San Morelle M.D.   On: 03/19/2019 16:30   Ct Angio Chest Pe W/cm &/or Wo Cm  Result Date: 03/19/2019 CLINICAL DATA:  Concern for pulmonary embolism. EXAM: CT ANGIOGRAPHY CHEST WITH CONTRAST TECHNIQUE: Multidetector CT imaging of the chest was performed using the standard protocol during bolus administration of intravenous contrast. Multiplanar CT image reconstructions and MIPs were obtained to evaluate the vascular anatomy. CONTRAST:  39mL OMNIPAQUE IOHEXOL 350 MG/ML SOLN COMPARISON:  None. FINDINGS: Cardiovascular: The CTA is timed such that the aorta is opacified while the pulmonary arteries are poorly opacified. The this may in part be a secondary to patient condition as well as small contrast bolus. There is no definitive filling defects within the pulmonary arteries although poorly evaluated as above. No evidence of RIGHT ventricular strain. Coronary artery calcification and aortic atherosclerotic calcification. Aorta is normal. Mediastinum/Nodes: No axillary supraclavicular adenopathy. No mediastinal hilar adenopathy. No pericardial effusion. Lungs/Pleura: No pulmonary infarction.  No pleural fluid. Upper Abdomen: Limited view of the liver, kidneys, pancreas are unremarkable. Normal adrenal glands. Musculoskeletal: No aggressive osseous lesion. Review of the MIP images confirms the above findings. IMPRESSION: 1. No gross evidence acute pulmonary embolism on suboptimal exam. Consider anticoagulation and repeat exam when patient's renal function improves. 2. Coronary artery calcification and Aortic Atherosclerosis (ICD10-I70.0). 3. No pulmonary infarction or pneumonia. Electronically Signed   By: Suzy Bouchard M.D.   On:  03/19/2019 16:18   Mr Jeri Cos MP Contrast  Result Date: 03/19/2019 CLINICAL DATA:  Abnormal CT of the head. Patient collapsed after a run. EXAM: MRI HEAD WITHOUT AND WITH CONTRAST TECHNIQUE: Multiplanar, multiecho pulse sequences of the brain and surrounding structures were obtained without and with intravenous contrast. CONTRAST:  9 mL Gadavist COMPARISON:  CT head without contrast 03/19/2019 FINDINGS: Brain: Infiltrating mass is present in the left superior frontal gyrus. Tumor measures 2.6 x 4.2 x 3.5 cm. There is subtle enhancement associated with the lesion. The most significant enhancement is superiorly along the dura. There is no significant cystic component. There is focal cortical thickening and increased T2 signal in the posterior cingulate gyrus, best seen on images 17 and 18 of series 13 and image 12 of series 21. There is no enhancement associated with this area. No other focal lesions are present. Asymmetric T2 hyperintensity is present within the medial left temporal lobe and  hippocampus. Mild periventricular T2 hyperintensities are otherwise within normal limits for age. The ventricles are of normal size. No significant extraaxial fluid collection is present. Vascular: Normal flow voids. Skull and upper cervical spine: Insert normal skull Sinuses/Orbits: Mild mucosal thickening is present in the ethmoid air cells bilaterally. Polyps or mucous retention cysts are noted inferiorly in the maxillary sinuses. The mastoid air cells are clear. Globes and orbits are within normal limits. IMPRESSION: 1. Infiltrating tumor mass in the left superior frontal gyrus measures 2.6 x 4.2 x 3.5 cm. There is subtle enhancement. Focal enhancement is associated with the dura. 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 The differential diagnosis includes oligodendroglioma. Ganglioglioma or DNET could have a similar appearance but are atypical in this age group. Metastatic disease is considered unlikely with  this appearance. 2. Additional focal area of T2 hyperintensity and cortical thickening posteriorly in the cingulate gyrus may represent a second area of tumor. 3. Subtle T2 hyperintensity in the medial left temporal lobe could be related to seizure activity. Electronically Signed   By: San Morelle M.D.   On: 03/19/2019 18:39   Mr Cervical Spine Wo Contrast  Result Date: 03/19/2019 CLINICAL DATA:  Cervical spine trauma, myelopathy. Collapsed on floor after coming home from a run. EXAM: MRI CERVICAL SPINE WITHOUT CONTRAST TECHNIQUE: Multiplanar, multisequence MR imaging of the cervical spine was performed. No intravenous contrast was administered. COMPARISON:  CTA of the neck 03/19/2019 FINDINGS: Alignment: Slight retrolisthesis is evident at C3-4. Slight anterolisthesis is present at C7-T1, T1-2, T2-3, and T3-4. AP alignment is otherwise anatomic. Vertebrae: Edematous endplate marrow changes are noted posteriorly at C4-5. Chronic fatty endplate marrow changes are present at C3-4, C5-6, and C6-7. Cord: Normal signal and morphology are present in the cervical and upper thoracic spinal cord to the lowest imaged level, T3-4. Posterior Fossa, vertebral arteries, paraspinal tissues: Craniocervical junction is normal. Flow is present in the vertebral arteries bilaterally. Visualized intracranial contents are normal. Disc levels: C2-3: Mild uncovertebral spurring is present on the right. There is no significant stenosis. C3-4: A broad-based disc osteophyte complex is present. There is partial effacement of the ventral CSF. Uncovertebral and facet spurring contribute to moderate foraminal narrowing, left greater than right. C4-5: A broad-based disc osteophyte complex is present. There is effacement of ventral CSF and slight distortion of ventral surface the cord. The canal is narrowed to 8.5 mm. Severe right and moderate left foraminal narrowing is due to uncovertebral and facet disease. C5-6: A broad-based disc  osteophyte complex present. There is effacement of ventral scratched at there is partial effacement ventral CSF. Moderate foraminal stenosis due to uncovertebral and facet disease is worse left than right. C6-7: A leftward disc osteophyte complex is present. There is effacement of the ventral CSF. Moderate left and mild right foraminal narrowing is present. C7-T1: Moderate facet hypertrophy is worse left than right. There is no significant disc herniation or stenosis. IMPRESSION: 1. No acute abnormality. 2. Multilevel degenerative disc disease as described. 3. Mild central and moderate bilateral foraminal narrowing at C3-4 is worse left than right. 4. 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 This is the most severe level. This is also the only level with edematous endplate marrow changes, suggesting the most recent progression. 5. Mild central and moderate bilateral foraminal stenosis at C5-6 is worse left than right. 6. Moderate left and mild right foraminal narrowing at C6-7. 7. Moderate facet hypertrophy at C7-T1 is worse on the left.  There is no significant focal stenosis. Electronically Signed   By: San Morelle M.D.   On: 03/19/2019 17:53   Ct Head Code Stroke Wo Contrast  Result Date: 03/19/2019 CLINICAL DATA:  Code stroke.  Left-sided weakness. EXAM: CT HEAD WITHOUT CONTRAST TECHNIQUE: Contiguous axial images were obtained from the base of the skull through the vertex without intravenous contrast. COMPARISON:  None. FINDINGS: Brain: No acute infarct is present. The insular ribbon is normal bilaterally. Basal ganglia are intact. There is a mass lesion in the high posterior left frontal lobe. Lesion blends with the gray matter. It measures approximately 2.6 x 2.5 x 3.0 cm. The tumor extends nearly to the corpus callosum. No other definite lesions are present. No significant white matter lesions are present. The ventricles are of normal size.  The brainstem and cerebellum are within normal limits. No significant extraaxial fluid collection is present. Vascular: No hyperdense vessel or unexpected calcification. Skull: Insert normal calvarium no focal lytic or blastic lesions are present. Sinuses/Orbits: The left ethmoid air cell is opacified. Polyps or mucous retention cysts are present along the inferior maxillary sinuses. The remaining paranasal sinuses and mastoid air cells are otherwise clear. The globes and orbits are within normal limits. ASPECTS [facility] Stroke Program Early CT Score) - Ganglionic level infarction (caudate, lentiform nuclei, internal capsule, insula, M1-M3 cortex): 7/7 - Supraganglionic infarction (M4-M6 cortex): 3/3 Total score (0-10 with 10 being normal): 10/10 IMPRESSION: 1. No acute infarct. 2. Mass lesion in the posterior high left frontal lobe may involve the primary motor cortex. 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 Metastatic disease should also be considered. Consider MRI the brain without and with contrast for further evaluation. 3. ASPECTS is 10/10 These results were called by telephone at the time of interpretation on 03/19/2019 at 3:31 pm to Dr. Leonel Ramsay , who verbally acknowledged these results. Electronically Signed   By: San Morelle M.D.   On: 03/19/2019 15:35        Scheduled Meds:  aspirin EC  81 mg Oral Daily   lisinopril  20 mg Oral Daily   multivitamin with minerals  1 tablet Oral Daily   Continuous Infusions:  sodium chloride 1,000 mL (03/19/19 1555)   sodium chloride 75 mL/hr at 03/20/19 0003   levETIRAcetam 500 mg (03/20/19 0227)     LOS: 1 day     Georgette Shell, MD [facility]  If 7PM-7AM, please contact night-coverage [email or web address] Password [facility] 03/20/2019, 8:24 AM

## 2019-03-20 NOTE — Progress Notes (Signed)
Fellow note reviewed, neurology notes reviewed. 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 We are awaiting echo, tele shows rare PVCs, no significant arrhythmias  Reported 10 beat run of NSVT by EMS, unconfirmed as strips unavailable.   If benign echo and no significant findings on tele would not anticipate further cardiac workup there appears to be a clear neurological etiology for the episode   Brent Abts MD

## 2019-03-20 NOTE — Discharge Summary (Signed)
Physician Discharge Summary  Brent Coleman ZOX:[id number] DOB: [birth year] DOA: 03/19/2019  PCP: Colon Branch, MD  Admit date: 03/19/2019 Discharge date: 03/20/2019  Admitted From: Home Disposition: Home  Recommendations for Outpatient Follow-up:  1. Follow up with PCP in 1-2 weeks 2. Please obtain BMP/CBC in one week 3. Please follow up neurosurgery Dr. Kathyrn Sheriff  Home Health: None Equipment/Devices: None Discharge Condition: Stable and improved CODE STATUS: Full code Diet recommendation: Cardiac  Brief/Interim Summary:78 y.o.malewith medical history significant ofhypertension, hyperlipidemia, GERD, CKD-2, prostate cancer (radiation therapy 13 years ago), who presents with unresponsiveness.   Per report, pt was in hisnormal state of health until around 1:15PM today. Hesuddenly began "shaking" after did someexercise.The total time shaking was approximately 15 seconds. Hedidcycling about 6 miles and then had been walking around.He got a glass of soda with ice and then was sitting at the table and drinking it, hissymptoms started.Per report, ptslumped to the side hitting his head on the chair. His head then fell forward with his chin resting on the table. Per EMS report,pt was found pt agonal, cyanotic and unresponsive.Pt was noted to have left side weakness which hasgradually improvedon arrival to ED.Ptwasbagged for 3-4 mins,andbecame responsive.EMS also reported that I had 10 beats of V-tach. When I saw pt, he is alter and oriented x 3. Hedoes not remember exactly what happened to him. He denies unilateral weakness, numbness or tingling his. No facial droop or slurred speech. Patient does not have chest pain, shortness of breath, cough. No fever or chills. No nausea vomiting, diarrhea, abdominal pain, symptoms of UTI.  ED Course:pt was found to have WBC 8.9, lactic acid 2.0, INR 1.1, pending COVID-19 test, potassium 3.4, stable renal function, temperature  normal, initially tachycardia with heart rate of 113, then 85, no tachypnea, oxygen saturation 96% on room air, blood pressure 165/88.CTA of chest showed no gross evidence acute pulmonary embolism on suboptimal exam.CTA of head and neck showed no LOV. CT of head showed a mass lesion in the posterior high left frontal lobe.MRI of neck showed degenerative disc disease and foraminal narrowing. Pt isadmitted to stepdownasinpatient. EDP consulted Dr.Kirkpatrick for neurology, Dr. Charissa Bash of cardiology. Neurosurgery willalsobe consulted by EDP Discharge Diagnoses:  Principal Problem:   Unresponsive Active Problems:   Essential hypertension   Chronic renal insufficiency, stage II (mild)   GERD (gastroesophageal reflux disease)   Brain mass   V tach (HCC)   Hypokalemia   Seizure ([place])   #1 brain mass patient admitted with unresponsiveness and shakiness.  He was also found to have 1 episode of 10 beat of V. tach by the EMS report.  Work-up showed that he has infiltrating tumor mass in the left superior frontal gyrus measuring 2.6 x 4.2 x 3.5 cm.  Patient seen by neurosurgery and is planning to see him as an outpatient for further surgical options.  Patient was also seen by neurology recommends Keppra 500 twice a day.  He should not drive.  MRI of the C-spine shows degenerative disc disease and foraminal narrowing.  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  Patient has had no further seizures since admission to hospital.  Continue Keppra.   #3 V. tach no further episodes since admission.  This is per EMS report.  Patient denies any chest pain shortness of breath.  His cardiac enzymes are negative.  EKG shows sinus rhythm.  Echocardiogram ordered.  But not done.  Since he did not have any further episodes of V. tach  or cardiac complaints it was not done.  #4 hypertension continue lisinopril.  #5 CKD stage II Baseline creatinine is 1.3-1.5.  Today his creatinine level is  1.39.  #6 hypokalemia resolved potassium 3.9.  #7 history of GERD.  Continue PPI.  #8 lactic acidosis admitting lactate level was 7.7 which is probably secondary to seizure activity.  This is come down to 2.0.    Estimated body mass index is 27.3 kg/m as calculated from the following:   Height as of this encounter: 6' (1.829 m).   Weight as of this encounter: 91.3 kg.  Discharge Instructions  Discharge Instructions    Call MD for:  difficulty breathing, headache or visual disturbances   Complete by: As directed    Call MD for:  persistant nausea and vomiting   Complete by: As directed    Diet - low sodium heart healthy   Complete by: As directed    Increase activity slowly   Complete by: As directed      Allergies as of 03/20/2019      Reactions   Doxazosin Mesylate Other (See Comments)   REACTION: Abdominal pain      Medication List    STOP taking these medications   aspirin EC 81 MG tablet     TAKE these medications   levETIRAcetam 500 MG tablet Commonly known as: Keppra Take 1 tablet (500 mg total) by mouth 2 (two) times daily.   lisinopril 20 MG tablet Commonly known as: ZESTRIL Take 1 tablet (20 mg total) by mouth daily.   multivitamin with minerals tablet Take 1 tablet by mouth daily.   TUMS PO Take 2 tablets by mouth at bedtime as needed (acid reflux).      Follow-up Information    Consuella Lose, MD. Call in 1 week(s).   Specialty: Neurosurgery Contact information: [address] [place] [address] [phone]        Colon Branch, MD Follow up.   Specialty: Internal Medicine Contact information: [phone] [address] [facility] [address] [place] [address] [phone]          Allergies  Allergen Reactions  . Doxazosin Mesylate Other (See Comments)    REACTION: Abdominal pain    Consultations:  Neurology, neurosurgery, cardiology   Procedures/Studies: Ct Angio Head W Or Wo  Contrast  Result Date: 03/19/2019 CLINICAL DATA:  Left-sided weakness. EXAM: CT ANGIOGRAPHY HEAD AND NECK TECHNIQUE: Multidetector CT imaging of the head and neck was performed using the standard protocol during bolus administration of intravenous contrast. Multiplanar CT image reconstructions and MIPs were obtained to evaluate the vascular anatomy. Carotid stenosis measurements (when applicable) are obtained utilizing NASCET criteria, using the distal internal carotid diameter as the denominator. CONTRAST:  39mL OMNIPAQUE IOHEXOL 350 MG/ML SOLN COMPARISON:  CT head without contrast 03/19/2019 FINDINGS: CTA NECK FINDINGS Aortic arch: A 3 vessel arch configuration is present. Mild atherosclerotic changes are noted at the aortic arch. There is no significant stenosis or aneurysm. Right carotid system: The right common carotid artery is within normal limits. Calcifications are present at the origin of the right ICA. There is mild tortuosity in the right ICA without a significant stenosis. Left carotid system: The left common carotid artery is within normal limits. Atherosclerotic changes are noted in the proximal left ICA without a significant stenosis. There is mild tortuosity of the cervical left ICA without stenosis. Vertebral arteries: The left vertebral artery is the dominant vessel. Both vertebral arteries originate from the  subclavian arteries without significant stenoses. There is focal calcification at the origin of the right vertebral artery. There is no significant stenosis of either vertebral artery in the neck. Skeleton: Multilevel endplate degenerative changes are present C3-4 through C6-7. Uncovertebral spurring contributes to foraminal narrowing on the right at C4-5 and C5-6 on the left at C3-4. Marrow signal and vertebral body heights are normal. Other neck: Soft tissues the neck are otherwise unremarkable. No focal mucosal or submucosal lesions are present. Salivary glands are within normal limits.  No significant adenopathy is present. Thyroid is normal. Upper chest: There is fluid within a mildly dilated esophagus. Lung apices are clear. Thoracic inlet is within normal limits. Review of the MIP images confirms the above findings CTA HEAD FINDINGS Anterior circulation: Atherosclerotic calcifications are present in the cavernous internal carotid arteries bilaterally without a significant stenosis relative to the more distal vessel. The ICA termini are normal bilaterally. The left A1 segment is aplastic. The anterior communicating artery is patent. Both A2 segments fill from the right. The M1 segments are normal bilaterally. MCA bifurcations are intact. There is some attenuation of distal MCA branch vessels bilaterally. No significant proximal stenosis or occlusion is present. Posterior circulation: The left vertebral artery is the dominant vessel. PICA origins are visualized and normal. Vertebrobasilar junction is normal. The basilar artery is normal. The left posterior cerebral artery is of fetal type. The right posterior cerebral artery originates from the basilar tip. The PCA branch vessels demonstrate distal narrowing bilaterally. No significant proximal stenosis or occlusion is present. Venous sinuses: Dural sinuses are patent. Straight sinus deep cerebral veins are intact. Cortical veins are unremarkable. Anatomic variants: Fetal type left posterior cerebral artery. Delayed phase: The delayed images demonstrate subtle enhancement of the posterior left frontal mass. No other focal lesions are present. The lesion measures 2.6 x 2.5 x 2.8 cm. No other focal lesions are present. There is no infarct on the postcontrast images. Review of the MIP images confirms the above findings IMPRESSION: 1. No significant large vessel occlusion. 2. Atherosclerotic changes at the carotid bifurcations and cavernous internal carotid arteries bilaterally without significant stenosis. 3. Moderate attenuation of distal branch  vessels in the anterior and posterior circulation without a significant proximal stenosis or occlusion within the circle of Willis. 4. 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 Metastatic disease is considered less likely. MRI the brain without and with contrast would be useful for further evaluation. Electronically Signed   By: San Morelle M.D.   On: 03/19/2019 16:30   Ct Angio Neck W Or Wo Contrast  Result Date: 03/19/2019 CLINICAL DATA:  Left-sided weakness. EXAM: CT ANGIOGRAPHY HEAD AND NECK TECHNIQUE: Multidetector CT imaging of the head and neck was performed using the standard protocol during bolus administration of intravenous contrast. Multiplanar CT image reconstructions and MIPs were obtained to evaluate the vascular anatomy. Carotid stenosis measurements (when applicable) are obtained utilizing NASCET criteria, using the distal internal carotid diameter as the denominator. CONTRAST:  46mL OMNIPAQUE IOHEXOL 350 MG/ML SOLN COMPARISON:  CT head without contrast 03/19/2019 FINDINGS: CTA NECK FINDINGS Aortic arch: A 3 vessel arch configuration is present. Mild atherosclerotic changes are noted at the aortic arch. There is no significant stenosis or aneurysm. Right carotid system: The right common carotid artery is within normal limits. Calcifications are present at the origin of the right ICA. There is mild tortuosity in the right ICA without a significant stenosis. Left carotid system: The left common  carotid artery is within normal limits. Atherosclerotic changes are noted in the proximal left ICA without a significant stenosis. There is mild tortuosity of the cervical left ICA without stenosis. Vertebral arteries: The left vertebral artery is the dominant vessel. Both vertebral arteries originate from the subclavian arteries without significant stenoses. There is focal calcification at the origin of the right vertebral artery. There is no  significant stenosis of either vertebral artery in the neck. Skeleton: Multilevel endplate degenerative changes are present C3-4 through C6-7. Uncovertebral spurring contributes to foraminal narrowing on the right at C4-5 and C5-6 on the left at C3-4. Marrow signal and vertebral body heights are normal. Other neck: Soft tissues the neck are otherwise unremarkable. No focal mucosal or submucosal lesions are present. Salivary glands are within normal limits. No significant adenopathy is present. Thyroid is normal. Upper chest: There is fluid within a mildly dilated esophagus. Lung apices are clear. Thoracic inlet is within normal limits. Review of the MIP images confirms the above findings CTA HEAD FINDINGS Anterior circulation: Atherosclerotic calcifications are present in the cavernous internal carotid arteries bilaterally without a significant stenosis relative to the more distal vessel. The ICA termini are normal bilaterally. The left A1 segment is aplastic. The anterior communicating artery is patent. Both A2 segments fill from the right. The M1 segments are normal bilaterally. MCA bifurcations are intact. There is some attenuation of distal MCA branch vessels bilaterally. No significant proximal stenosis or occlusion is present. Posterior circulation: The left vertebral artery is the dominant vessel. PICA origins are visualized and normal. Vertebrobasilar junction is normal. The basilar artery is normal. The left posterior cerebral artery is of fetal type. The right posterior cerebral artery originates from the basilar tip. The PCA branch vessels demonstrate distal narrowing bilaterally. No significant proximal stenosis or occlusion is present. Venous sinuses: Dural sinuses are patent. Straight sinus deep cerebral veins are intact. Cortical veins are unremarkable. Anatomic variants: Fetal type left posterior cerebral artery. Delayed phase: The delayed images demonstrate subtle enhancement of the posterior left  frontal mass. No other focal lesions are present. The lesion measures 2.6 x 2.5 x 2.8 cm. No other focal lesions are present. There is no infarct on the postcontrast images. Review of the MIP images confirms the above findings IMPRESSION: 1. No significant large vessel occlusion. 2. Atherosclerotic changes at the carotid bifurcations and cavernous internal carotid arteries bilaterally without significant stenosis. 3. Moderate attenuation of distal branch vessels in the anterior and posterior circulation without a significant proximal stenosis or occlusion within the circle of Willis. 4. 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 Metastatic disease is considered less likely. MRI the brain without and with contrast would be useful for further evaluation. Electronically Signed   By: San Morelle M.D.   On: 03/19/2019 16:30   Ct Angio Chest Pe W/cm &/or Wo Cm  Result Date: 03/19/2019 CLINICAL DATA:  Concern for pulmonary embolism. EXAM: CT ANGIOGRAPHY CHEST WITH CONTRAST TECHNIQUE: Multidetector CT imaging of the chest was performed using the standard protocol during bolus administration of intravenous contrast. Multiplanar CT image reconstructions and MIPs were obtained to evaluate the vascular anatomy. CONTRAST:  16mL OMNIPAQUE IOHEXOL 350 MG/ML SOLN COMPARISON:  None. FINDINGS: Cardiovascular: The CTA is timed such that the aorta is opacified while the pulmonary arteries are poorly opacified. The this may in part be a secondary to patient condition as well as small contrast bolus. There is no definitive filling defects within the pulmonary  arteries although poorly evaluated as above. No evidence of RIGHT ventricular strain. Coronary artery calcification and aortic atherosclerotic calcification. Aorta is normal. Mediastinum/Nodes: No axillary supraclavicular adenopathy. No mediastinal hilar adenopathy. No pericardial effusion. Lungs/Pleura: No pulmonary  infarction.  No pleural fluid. Upper Abdomen: Limited view of the liver, kidneys, pancreas are unremarkable. Normal adrenal glands. Musculoskeletal: No aggressive osseous lesion. Review of the MIP images confirms the above findings. IMPRESSION: 1. No gross evidence acute pulmonary embolism on suboptimal exam. Consider anticoagulation and repeat exam when patient's renal function improves. 2. Coronary artery calcification and Aortic Atherosclerosis (ICD10-I70.0). 3. No pulmonary infarction or pneumonia. Electronically Signed   By: Suzy Bouchard M.D.   On: 03/19/2019 16:18   Mr Jeri Cos WP Contrast  Result Date: 03/19/2019 CLINICAL DATA:  Abnormal CT of the head. Patient collapsed after a run. EXAM: MRI HEAD WITHOUT AND WITH CONTRAST TECHNIQUE: Multiplanar, multiecho pulse sequences of the brain and surrounding structures were obtained without and with intravenous contrast. CONTRAST:  9 mL Gadavist COMPARISON:  CT head without contrast 03/19/2019 FINDINGS: Brain: Infiltrating mass is present in the left superior frontal gyrus. Tumor measures 2.6 x 4.2 x 3.5 cm. There is subtle enhancement associated with the lesion. The most significant enhancement is superiorly along the dura. There is no significant cystic component. There is focal cortical thickening and increased T2 signal in the posterior cingulate gyrus, best seen on images 17 and 18 of series 13 and image 12 of series 21. There is no enhancement associated with this area. No other focal lesions are present. Asymmetric T2 hyperintensity is present within the medial left temporal lobe and hippocampus. Mild periventricular T2 hyperintensities are otherwise within normal limits for age. The ventricles are of normal size. No significant extraaxial fluid collection is present. Vascular: Normal flow voids. Skull and upper cervical spine: Insert normal skull Sinuses/Orbits: Mild mucosal thickening is present in the ethmoid air cells bilaterally. Polyps or mucous  retention cysts are noted inferiorly in the maxillary sinuses. The mastoid air cells are clear. Globes and orbits are within normal limits. IMPRESSION: 1. Infiltrating tumor mass in the left superior frontal gyrus measures 2.6 x 4.2 x 3.5 cm. There is subtle enhancement. Focal enhancement is associated with the dura. 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 The differential diagnosis includes oligodendroglioma. Ganglioglioma or DNET could have a similar appearance but are atypical in this age group. Metastatic disease is considered unlikely with this appearance. 2. Additional focal area of T2 hyperintensity and cortical thickening posteriorly in the cingulate gyrus may represent a second area of tumor. 3. Subtle T2 hyperintensity in the medial left temporal lobe could be related to seizure activity. Electronically Signed   By: San Morelle M.D.   On: 03/19/2019 18:39   Mr Cervical Spine Wo Contrast  Result Date: 03/19/2019 CLINICAL DATA:  Cervical spine trauma, myelopathy. Collapsed on floor after coming home from a run. EXAM: MRI CERVICAL SPINE WITHOUT CONTRAST TECHNIQUE: Multiplanar, multisequence MR imaging of the cervical spine was performed. No intravenous contrast was administered. COMPARISON:  CTA of the neck 03/19/2019 FINDINGS: Alignment: Slight retrolisthesis is evident at C3-4. Slight anterolisthesis is present at C7-T1, T1-2, T2-3, and T3-4. AP alignment is otherwise anatomic. Vertebrae: Edematous endplate marrow changes are noted posteriorly at C4-5. Chronic fatty endplate marrow changes are present at C3-4, C5-6, and C6-7. Cord: Normal signal and morphology are present in the cervical and upper thoracic spinal cord to the lowest imaged level, T3-4. Posterior Fossa, vertebral arteries, paraspinal tissues: Craniocervical  junction is normal. Flow is present in the vertebral arteries bilaterally. Visualized intracranial contents are normal. Disc levels: C2-3: Mild uncovertebral  spurring is present on the right. There is no significant stenosis. C3-4: A broad-based disc osteophyte complex is present. There is partial effacement of the ventral CSF. Uncovertebral and facet spurring contribute to moderate foraminal narrowing, left greater than right. C4-5: A broad-based disc osteophyte complex is present. There is effacement of ventral CSF and slight distortion of ventral surface the cord. The canal is narrowed to 8.5 mm. Severe right and moderate left foraminal narrowing is due to uncovertebral and facet disease. C5-6: A broad-based disc osteophyte complex present. There is effacement of ventral scratched at there is partial effacement ventral CSF. Moderate foraminal stenosis due to uncovertebral and facet disease is worse left than right. C6-7: A leftward disc osteophyte complex is present. There is effacement of the ventral CSF. Moderate left and mild right foraminal narrowing is present. C7-T1: Moderate facet hypertrophy is worse left than right. There is no significant disc herniation or stenosis. IMPRESSION: 1. No acute abnormality. 2. Multilevel degenerative disc disease as described. 3. Mild central and moderate bilateral foraminal narrowing at C3-4 is worse left than right. 4. 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 This is the most severe level. This is also the only level with edematous endplate marrow changes, suggesting the most recent progression. 5. Mild central and moderate bilateral foraminal stenosis at C5-6 is worse left than right. 6. Moderate left and mild right foraminal narrowing at C6-7. 7. Moderate facet hypertrophy at C7-T1 is worse on the left. There is no significant focal stenosis. Electronically Signed   By: San Morelle M.D.   On: 03/19/2019 17:53   Ct Head Code Stroke Wo Contrast  Result Date: 03/19/2019 CLINICAL DATA:  Code stroke.  Left-sided weakness. EXAM: CT HEAD WITHOUT CONTRAST TECHNIQUE:  Contiguous axial images were obtained from the base of the skull through the vertex without intravenous contrast. COMPARISON:  None. FINDINGS: Brain: No acute infarct is present. The insular ribbon is normal bilaterally. Basal ganglia are intact. There is a mass lesion in the high posterior left frontal lobe. Lesion blends with the gray matter. It measures approximately 2.6 x 2.5 x 3.0 cm. The tumor extends nearly to the corpus callosum. No other definite lesions are present. No significant white matter lesions are present. The ventricles are of normal size. The brainstem and cerebellum are within normal limits. No significant extraaxial fluid collection is present. Vascular: No hyperdense vessel or unexpected calcification. Skull: Insert normal calvarium no focal lytic or blastic lesions are present. Sinuses/Orbits: The left ethmoid air cell is opacified. Polyps or mucous retention cysts are present along the inferior maxillary sinuses. The remaining paranasal sinuses and mastoid air cells are otherwise clear. The globes and orbits are within normal limits. ASPECTS [facility] Stroke Program Early CT Score) - Ganglionic level infarction (caudate, lentiform nuclei, internal capsule, insula, M1-M3 cortex): 7/7 - Supraganglionic infarction (M4-M6 cortex): 3/3 Total score (0-10 with 10 being normal): 10/10 IMPRESSION: 1. No acute infarct. 2. Mass lesion in the posterior high left frontal lobe may involve the primary motor cortex. 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 Metastatic disease should also be considered. Consider MRI the brain without and with contrast for further evaluation. 3. ASPECTS is 10/10 These results were called by telephone at the time of interpretation on 03/19/2019 at 3:31 pm to Dr. Leonel Ramsay ,  who verbally acknowledged these results. Electronically Signed   By: San Morelle M.D.   On: 03/19/2019 15:35    (Echo, Carotid, EGD, Colonoscopy, ERCP)     Subjective: Patient is resting in bed in no acute distress concerned about the hoarse voice I had just woken him up from sleep.  Discharge Exam: Vitals:   03/20/19 0742 03/20/19 1147  BP: (!) 145/78 (!) 147/77  Pulse: 72 68  Resp: 18 15  Temp: 98.2 F (36.8 C) 97.9 F (36.6 C)  SpO2: 94% 98%   Vitals:   03/19/19 2336 03/20/19 0518 03/20/19 0742 03/20/19 1147  BP: (!) 155/91 (!) 149/71 (!) 145/78 (!) 147/77  Pulse: 79 72 72 68  Resp: 18 18 18 15   Temp: 98.3 F (36.8 C) 98.2 F (36.8 C) 98.2 F (36.8 C) 97.9 F (36.6 C)  TempSrc:   Oral Oral  SpO2: 97% 96% 94% 98%  Weight:      Height:        General: Pt is alert, awake, not in acute distress Cardiovascular: RRR, S1/S2 +, no rubs, no gallops Respiratory: CTA bilaterally, no wheezing, no rhonchi Abdominal: Soft, NT, ND, bowel sounds + Extremities: no edema, no cyanosis    The results of significant diagnostics from this hospitalization (including imaging, microbiology, ancillary and laboratory) are listed below for reference.     Microbiology: No results found for this or any previous visit (from the past 240 hour(s)).   Labs: BNP (last 3 results) No results for input(s): BNP in the last 8760 hours. Basic Metabolic Panel: Recent Labs  Lab 03/19/19 1505 03/19/19 1509 03/20/19 0528  NA 137 137 138  K 3.4* 3.4* 3.9  CL 106 105 109  CO2 14*  --  22  GLUCOSE 154* 157* 109*  BUN 23 25* 18  CREATININE 1.69* 1.50* 1.39*  CALCIUM 8.7*  --  8.6*  MG  --   --  2.0   Liver Function Tests: Recent Labs  Lab 03/19/19 1505  AST 26  ALT 19  ALKPHOS 49  BILITOT 2.0*  PROT 5.6*  ALBUMIN 3.5   No results for input(s): LIPASE, AMYLASE in the last 168 hours. No results for input(s): AMMONIA in the last 168 hours. CBC: Recent Labs  Lab 03/19/19 1505 03/19/19 1509 03/20/19 0528  WBC 8.9  --  8.0  NEUTROABS 5.2  --   --   HGB 15.5 14.3 14.2  HCT 43.8 42.0 38.9*  MCV 95.8  --  94.2  PLT 160  --  127*    Cardiac Enzymes: Recent Labs  Lab 03/19/19 1505  CKTOTAL 172  TROPONINI <0.03   BNP: Invalid input(s): POCBNP CBG: Recent Labs  Lab 03/19/19 1504  GLUCAP 152*   D-Dimer Recent Labs    03/19/19 1505  DDIMER 0.80*   Hgb A1c No results for input(s): HGBA1C in the last 72 hours. Lipid Profile No results for input(s): CHOL, HDL, LDLCALC, TRIG, CHOLHDL, LDLDIRECT in the last 72 hours. Thyroid function studies No results for input(s): TSH, T4TOTAL, T3FREE, THYROIDAB in the last 72 hours.  Invalid input(s): FREET3 Anemia work up No results for input(s): VITAMINB12, FOLATE, FERRITIN, TIBC, IRON, RETICCTPCT in the last 72 hours. Urinalysis    Component Value Date/Time   COLORURINE YELLOW 11/09/2009 [place] 11/09/2009 1355   LABSPEC 1.012 11/09/2009 1355   PHURINE 6.0 11/09/2009 1355   GLUCOSEU NEGATIVE 11/09/2009 1355   GLUCOSEU NEGATIVE 01/08/2009 0931   HGBUR NEGATIVE 11/09/2009 1355  BILIRUBINUR Negative 02/02/2017 [place] 11/09/2009 1355   PROTEINUR Negative 02/02/2017 1123   PROTEINUR NEGATIVE 11/09/2009 1355   UROBILINOGEN 0.2 02/02/2017 1123   UROBILINOGEN 0.2 11/09/2009 1355   NITRITE Negative 02/02/2017 1123   NITRITE NEGATIVE 11/09/2009 1355   LEUKOCYTESUR Negative 02/02/2017 1123   Sepsis Labs Invalid input(s): PROCALCITONIN,  WBC,  LACTICIDVEN Microbiology No results found for this or any previous visit (from the past 240 hour(s)).   Time coordinating discharge:33 minutes  SIGNED:   Georgette Shell, MD  [facility] 03/20/2019, 12:12 PM Pager   If 7PM-7AM, please contact night-coverage [email or web address] Password [email or web address]

## 2019-03-20 NOTE — Progress Notes (Addendum)
Neurology Progress Note   S://  No new neuro events overnight.  Much improved from yesterday. No voiced complaints    O:// Current vital signs: BP (!) 145/78 (BP Location: Left Arm)   Pulse 72   Temp 98.2 F (36.8 C) (Oral)   Resp 18   Ht 6' (1.829 m)   Wt 91.3 kg   SpO2 94%   BMI 27.30 kg/m  Vital signs in last 24 hours: Temp:  [97.5 F (36.4 C)-98.3 F (36.8 C)] 98.2 F (36.8 C) (06/14 0742) Pulse Rate:  [72-113] 72 (06/14 0742) Resp:  [12-19] 18 (06/14 0742) BP: (145-171)/(71-93) 145/78 (06/14 0742) SpO2:  [94 %-100 %] 94 % (06/14 0742) Weight:  [91.3 kg] 91.3 kg (06/13 1545)    GENERAL: Awake, alert, calm in NAD Psych: Affect appropriate  HENT: - Normocephalic and atraumatic, dry mm Eyes:  Left eye with bruising, no scleral injection LUNGS - Clear to auscultation bilaterally with no wheezes CV -  RRR, no m/r/g, equal pulses bilaterally. ABDOMEN - Soft, nontender, nondistended with normoactive BS Ext: warm, well perfused, intact peripheral pulses, no edema  NEURO:  Mental Status: AA&O to person, place time and situation.  Follows commands Patient is able to give clear and coherent history.  Does not recall events from yesterday Language: speech is clear.  Naming, repetition, fluency, and comprehension intact. Cranial Nerves: PERRL 2 mm/brisk. EOMI, visual fields full, no facial asymmetry, facial sensation intact, hearing intact, tongue/uvula/soft palate midline, normal sternocleidomastoid and trapezius muscle strength. No evidence of tongue atrophy or fibrillations Motor: 5/5 strength in all extremities Tone: is normal and bulk is normal Sensation- Intact to light touch bilaterally Coordination: FTN intact bilaterally, no ataxia in BLE. Gait- deferred    Medications  Current Facility-Administered Medications:  .  [COMPLETED] sodium chloride 0.9 % bolus 500 mL, 500 mL, Intravenous, Once, Stopped at 03/19/19 1653 **FOLLOWED BY** 0.9 %  sodium chloride  infusion, 1,000 mL, Intravenous, Continuous, Ivor Costa, MD, Last Rate: 125 mL/hr at 03/19/19 1555, 1,000 mL at 03/19/19 1555 .  0.9 %  sodium chloride infusion, , Intravenous, Continuous, Ivor Costa, MD, Last Rate: 75 mL/hr at 03/20/19 0003 .  acetaminophen (TYLENOL) tablet 650 mg, 650 mg, Oral, Q6H PRN **OR** acetaminophen (TYLENOL) suppository 650 mg, 650 mg, Rectal, Q6H PRN, Ivor Costa, MD .  aspirin EC tablet 81 mg, 81 mg, Oral, Daily, Ivor Costa, MD .  calcium carbonate (TUMS - dosed in mg elemental calcium) chewable tablet 200 mg of elemental calcium, 1 tablet, Oral, QHS PRN, Ivor Costa, MD, 200 mg of elemental calcium at 03/20/19 0002 .  hydrALAZINE (APRESOLINE) injection 5 mg, 5 mg, Intravenous, Q2H PRN, Ivor Costa, MD .  levETIRAcetam (KEPPRA) IVPB 500 mg/100 mL premix, 500 mg, Intravenous, Q12H, Ivor Costa, MD, Last Rate: 400 mL/hr at 03/20/19 0227, 500 mg at 03/20/19 0227 .  lisinopril (ZESTRIL) tablet 20 mg, 20 mg, Oral, Daily, Ivor Costa, MD .  LORazepam (ATIVAN) injection 1 mg, 1 mg, Intravenous, Q2H PRN, Ivor Costa, MD .  multivitamin with minerals tablet 1 tablet, 1 tablet, Oral, Daily, Ivor Costa, MD .  ondansetron (ZOFRAN) tablet 4 mg, 4 mg, Oral, Q6H PRN **OR** ondansetron (ZOFRAN) injection 4 mg, 4 mg, Intravenous, Q6H PRN, Ivor Costa, MD Labs CBC   CMP     Lipid Panel   Imaging I have reviewed images in epic and the results pertinent to this consultation are:  CT-scan of the brain revealed a Left frontal lobe mass.  No acute  process   CTA head/Neck no LVO, atherosclerotic changes bilaterally at carotid bifurcations and internal carotid arteries.  Posterior left frontal lobe tumor mass   MRI examination of the brain Infiltrating tumor mass in the left superior frontal gyrus measures 2.6 x 4.2 x 3.5 cm with subtle enhancement.  Additional area noted of hyperintensity and cortical thickening in the cingulate gyrus.  Subtl hyperintensity in left medial temporal lobe    Assessment:  This is an 78 y.o.malewith medical history significant ofhypertension, hyperlipidemia, GERD, CKD-2, prostate cancer (radiation therapy 13 years ago), who presents with unresponsiveness.  He had cycle for about 6 miles and came back to the house, walking around in normal state of health until 1315, when he suddenly began shaking, and fell from a sitting position from the chair.  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 No need for EEG as inpatient given that it will not change management. He will need neurosurgical follow up and I will send a message to Dr. Mickeal Skinner as well.   Continue keppra 500mg  BID. I discussed sz precautions including no driving for 6 months by law with the patient.   Roland Rack, MD [facility] [phone]  If 7pm- 7am, please page neurology on call as listed in [place].

## 2019-03-21 ENCOUNTER — Other Ambulatory Visit: Payer: Self-pay | Admitting: Radiation Therapy

## 2019-03-21 ENCOUNTER — Telehealth: Payer: Self-pay | Admitting: *Deleted

## 2019-03-21 LAB — NOVEL CORONAVIRUS, NAA (HOSP ORDER, SEND-OUT TO REF LAB; TAT 18-24 HRS): SARS-CoV-2, NAA: NOT DETECTED

## 2019-03-21 NOTE — Telephone Encounter (Signed)
Called pt for TCM hospital follow up. Pt states he is doing fine. Home with wife. Independent with ADLS. Pt declines to schedule appt with PCP at this time. States he will call when ready.

## 2019-03-22 NOTE — Telephone Encounter (Signed)
Noted, thx.

## 2019-03-24 ENCOUNTER — Other Ambulatory Visit: Payer: Self-pay

## 2019-03-24 ENCOUNTER — Inpatient Hospital Stay: Payer: Medicare Other | Attending: Internal Medicine | Admitting: Internal Medicine

## 2019-03-24 VITALS — BP 135/71 | HR 78 | Temp 98.3°F | Resp 18 | Ht 72.0 in | Wt 194.8 lb

## 2019-03-24 DIAGNOSIS — Z8546 Personal history of malignant neoplasm of prostate: Secondary | ICD-10-CM | POA: Diagnosis not present

## 2019-03-24 DIAGNOSIS — I10 Essential (primary) hypertension: Secondary | ICD-10-CM | POA: Insufficient documentation

## 2019-03-24 DIAGNOSIS — G9389 Other specified disorders of brain: Secondary | ICD-10-CM | POA: Diagnosis not present

## 2019-03-24 DIAGNOSIS — R569 Unspecified convulsions: Secondary | ICD-10-CM | POA: Insufficient documentation

## 2019-03-24 NOTE — Progress Notes (Signed)
[facility] [place], [place] [address] [phone]   New Patient Evaluation  Date of Service: 03/24/19 Patient Name: Brent Coleman Patient MRN: [id number] Patient DOB: 11/26/1940 Provider: Ventura Sellers, MD  Identifying Statement:  Brent Coleman is a 78 y.o. male with left frontal brain mass who presents for initial consultation and evaluation.    Referring Provider: [facility], [address] [place],   [id number]  Oncologic History: Oncology History   No history exists.    Biomarkers:  MGMT Unknown.  IDH 1/2 Unknown.  EGFR Unknown  TERT Unknown   History of Present Illness: The patient's records from the referring physician were obtained and reviewed and the patient interviewed to confirm this HPI.  Brent Coleman presented to medical attention this past weekend after experiencing his first ever seizure.  Event was witnessed by wife, described as "staring ahead unresponsive with eyes open, rocking back and forth", he then "fell forward and continued period of unresponsiveness for 1-2 minutes".  Patient regained awareness once EMS arrived, and returned to baseline 1-2 hours later.  No provocation identified aside from high burden of stress recently.  No seizure history or seizure risk factors.  Brain MRI demonstrated non-enhancing left frontal mass, prompting consultation here today.  Since discharge, he has tolerated keppra well without further events.   Medications: Current Outpatient Medications on File Prior to Visit  Medication Sig Dispense Refill   Calcium Carbonate Antacid (TUMS PO) Take 2 tablets by mouth at bedtime as needed (acid reflux).     levETIRAcetam (KEPPRA) 500 MG tablet Take 1 tablet (500 mg total) by mouth 2 (two) times daily. 60 tablet 2   lisinopril (ZESTRIL) 20 MG tablet Take 1 tablet (20 mg total) by mouth daily. 90 tablet 1   Multiple Vitamins-Minerals  (MULTIVITAMIN WITH MINERALS) tablet Take 1 tablet by mouth daily.       No current facility-administered medications on file prior to visit.     Allergies:  Allergies  Allergen Reactions   Doxazosin Mesylate Other (See Comments)    REACTION: Abdominal pain   Past Medical History:  Past Medical History:  Diagnosis Date   Basal cell carcinoma    sees derm q year   Erectile dysfunction due to arterial insufficiency    moderate-patient not interested in treatment   Gallstone    GERD (gastroesophageal reflux disease)    Hyperlipidemia    Hypertension    Malignant neoplasm of prostate ([facility]) ~ 2007   sees urology q year   Past Surgical History:  Past Surgical History:  Procedure Laterality Date   POLYPECTOMY     TONSILLECTOMY  1949   Social History:  Social History   Socioeconomic History   Marital status: Married    Spouse name: Not on file   Number of children: 2   Years of education: Not on file   Highest education level: Not on file  Occupational History   Occupation: retired--  Scientist, product/process development strain: Not on file   Food insecurity    Worry: Not on file    Inability: Not on Lexicographer needs    Medical: Not on file    Non-medical: Not on file  Tobacco Use   Smoking status: Never Smoker   Smokeless tobacco: Never Used  Substance and Sexual Activity   Alcohol use: Yes   Drug use: No  Sexual activity: Yes    Partners: Female  Lifestyle   Physical activity    Days per week: Not on file    Minutes per session: Not on file   Stress: Not on file  Relationships   Social connections    Talks on phone: Not on file    Gets together: Not on file    Attends religious service: Not on file    Active member of club or organization: Not on file    Attends meetings of clubs or organizations: Not on file    Relationship status: Not on file   Intimate partner violence    Fear of current or ex partner: Not on file     Emotionally abused: Not on file    Physically abused: Not on file    Forced sexual activity: Not on file  Other Topics Concern   Not on file  Social History Narrative   HSG. College grad.    Married/ marriage is in good health. 2 Daughters. 2 grandchildren living in [place].    Work - retired from [employer] @ 72 and has enjoyed his retirement.         Family History:  Family History  Problem Relation Age of Onset   Colon cancer Mother 37   Colon cancer Father 39       colon   Hypertension Other    Stroke Other    CAD Neg Hx    Prostate cancer Neg Hx    Diabetes Neg Hx     Review of Systems: Constitutional: Denies fevers, chills or abnormal weight loss Eyes: Denies blurriness of vision Ears, nose, mouth, throat, and face: Denies mucositis or sore throat Respiratory: Denies cough, dyspnea or wheezes Cardiovascular: Denies palpitation, chest discomfort or lower extremity swelling Gastrointestinal:  Denies nausea, constipation, diarrhea GU: Denies dysuria or incontinence Skin: Denies abnormal skin rashes Neurological: Per HPI Musculoskeletal: Denies joint pain, back or neck discomfort. No decrease in ROM Behavioral/Psych: Denies anxiety, disturbance in thought content, and mood instability  Physical Exam: Vitals:   03/24/19 1013  BP: 135/71  Pulse: 78  Resp: 18  Temp: 98.3 F (36.8 C)  SpO2: 98%   KPS: 90. General: Alert, cooperative, pleasant, in no acute distress Head: Normal EENT: No conjunctival injection or scleral icterus. Oral mucosa moist Lungs: Resp effort normal Cardiac: Regular rate and rhythm Abdomen: Soft, non-distended abdomen Skin: No rashes cyanosis or petechiae. Extremities: No clubbing or edema  Neurologic Exam: Mental Status: Awake, alert, attentive to examiner. Oriented to self and environment. Language is fluent with intact comprehension.  Cranial Nerves: Visual acuity is grossly normal. Visual fields are full. Extra-ocular  movements intact. No ptosis. Face is symmetric, tongue midline. Motor: Tone and bulk are normal. Power is full in both arms and legs. Reflexes are symmetric, no pathologic reflexes present. Intact finger to nose bilaterally Sensory: Intact to light touch and temperature Gait: Normal and tandem gait is normal.   Labs: I have reviewed the data as listed    Component Value Date/Time   NA 138 03/20/2019 0528   K 3.9 03/20/2019 0528   CL 109 03/20/2019 0528   CO2 22 03/20/2019 0528   GLUCOSE 109 (H) 03/20/2019 0528   BUN 18 03/20/2019 0528   CREATININE 1.39 (H) 03/20/2019 0528   CALCIUM 8.6 (L) 03/20/2019 0528   PROT 5.6 (L) 03/19/2019 1505   ALBUMIN 3.5 03/19/2019 1505   AST 26 03/19/2019 1505   ALT 19 03/19/2019 1505   ALKPHOS  49 03/19/2019 1505   BILITOT 2.0 (H) 03/19/2019 1505   GFRNONAA 48 (L) 03/20/2019 0528   GFRAA 55 (L) 03/20/2019 0528   Lab Results  Component Value Date   WBC 8.0 03/20/2019   NEUTROABS 5.2 03/19/2019   HGB 14.2 03/20/2019   HCT 38.9 (L) 03/20/2019   MCV 94.2 03/20/2019   PLT 127 (L) 03/20/2019    Imaging:  Ct Angio Head W Or Wo Contrast  Result Date: 03/19/2019 CLINICAL DATA:  Left-sided weakness. EXAM: CT ANGIOGRAPHY HEAD AND NECK TECHNIQUE: Multidetector CT imaging of the head and neck was performed using the standard protocol during bolus administration of intravenous contrast. Multiplanar CT image reconstructions and MIPs were obtained to evaluate the vascular anatomy. Carotid stenosis measurements (when applicable) are obtained utilizing NASCET criteria, using the distal internal carotid diameter as the denominator. CONTRAST:  11m OMNIPAQUE IOHEXOL 350 MG/ML SOLN COMPARISON:  CT head without contrast 03/19/2019 FINDINGS: CTA NECK FINDINGS Aortic arch: A 3 vessel arch configuration is present. Mild atherosclerotic changes are noted at the aortic arch. There is no significant stenosis or aneurysm. Right carotid system: The right common carotid artery  is within normal limits. Calcifications are present at the origin of the right ICA. There is mild tortuosity in the right ICA without a significant stenosis. Left carotid system: The left common carotid artery is within normal limits. Atherosclerotic changes are noted in the proximal left ICA without a significant stenosis. There is mild tortuosity of the cervical left ICA without stenosis. Vertebral arteries: The left vertebral artery is the dominant vessel. Both vertebral arteries originate from the subclavian arteries without significant stenoses. There is focal calcification at the origin of the right vertebral artery. There is no significant stenosis of either vertebral artery in the neck. Skeleton: Multilevel endplate degenerative changes are present C3-4 through C6-7. Uncovertebral spurring contributes to foraminal narrowing on the right at C4-5 and C5-6 on the left at C3-4. Marrow signal and vertebral body heights are normal. Other neck: Soft tissues the neck are otherwise unremarkable. No focal mucosal or submucosal lesions are present. Salivary glands are within normal limits. No significant adenopathy is present. Thyroid is normal. Upper chest: There is fluid within a mildly dilated esophagus. Lung apices are clear. Thoracic inlet is within normal limits. Review of the MIP images confirms the above findings CTA HEAD FINDINGS Anterior circulation: Atherosclerotic calcifications are present in the cavernous internal carotid arteries bilaterally without a significant stenosis relative to the more distal vessel. The ICA termini are normal bilaterally. The left A1 segment is aplastic. The anterior communicating artery is patent. Both A2 segments fill from the right. The M1 segments are normal bilaterally. MCA bifurcations are intact. There is some attenuation of distal MCA branch vessels bilaterally. No significant proximal stenosis or occlusion is present. Posterior circulation: The left vertebral artery is  the dominant vessel. PICA origins are visualized and normal. Vertebrobasilar junction is normal. The basilar artery is normal. The left posterior cerebral artery is of fetal type. The right posterior cerebral artery originates from the basilar tip. The PCA branch vessels demonstrate distal narrowing bilaterally. No significant proximal stenosis or occlusion is present. Venous sinuses: Dural sinuses are patent. Straight sinus deep cerebral veins are intact. Cortical veins are unremarkable. Anatomic variants: Fetal type left posterior cerebral artery. Delayed phase: The delayed images demonstrate subtle enhancement of the posterior left frontal mass. No other focal lesions are present. The lesion measures 2.6 x 2.5 x 2.8 cm. No other focal lesions are present.  There is no infarct on the postcontrast images. Review of the MIP images confirms the above findings IMPRESSION: 1. No significant large vessel occlusion. 2. Atherosclerotic changes at the carotid bifurcations and cavernous internal carotid arteries bilaterally without significant stenosis. 3. Moderate attenuation of distal branch vessels in the anterior and posterior circulation without a significant proximal stenosis or occlusion within the circle of Willis. 4. 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 Metastatic disease is considered less likely. MRI the brain without and with contrast would be useful for further evaluation. Electronically Signed   By: San Morelle M.D.   On: 03/19/2019 16:30   Ct Angio Neck W Or Wo Contrast  Result Date: 03/19/2019 CLINICAL DATA:  Left-sided weakness. EXAM: CT ANGIOGRAPHY HEAD AND NECK TECHNIQUE: Multidetector CT imaging of the head and neck was performed using the standard protocol during bolus administration of intravenous contrast. Multiplanar CT image reconstructions and MIPs were obtained to evaluate the vascular anatomy. Carotid stenosis measurements (when  applicable) are obtained utilizing NASCET criteria, using the distal internal carotid diameter as the denominator. CONTRAST:  69m OMNIPAQUE IOHEXOL 350 MG/ML SOLN COMPARISON:  CT head without contrast 03/19/2019 FINDINGS: CTA NECK FINDINGS Aortic arch: A 3 vessel arch configuration is present. Mild atherosclerotic changes are noted at the aortic arch. There is no significant stenosis or aneurysm. Right carotid system: The right common carotid artery is within normal limits. Calcifications are present at the origin of the right ICA. There is mild tortuosity in the right ICA without a significant stenosis. Left carotid system: The left common carotid artery is within normal limits. Atherosclerotic changes are noted in the proximal left ICA without a significant stenosis. There is mild tortuosity of the cervical left ICA without stenosis. Vertebral arteries: The left vertebral artery is the dominant vessel. Both vertebral arteries originate from the subclavian arteries without significant stenoses. There is focal calcification at the origin of the right vertebral artery. There is no significant stenosis of either vertebral artery in the neck. Skeleton: Multilevel endplate degenerative changes are present C3-4 through C6-7. Uncovertebral spurring contributes to foraminal narrowing on the right at C4-5 and C5-6 on the left at C3-4. Marrow signal and vertebral body heights are normal. Other neck: Soft tissues the neck are otherwise unremarkable. No focal mucosal or submucosal lesions are present. Salivary glands are within normal limits. No significant adenopathy is present. Thyroid is normal. Upper chest: There is fluid within a mildly dilated esophagus. Lung apices are clear. Thoracic inlet is within normal limits. Review of the MIP images confirms the above findings CTA HEAD FINDINGS Anterior circulation: Atherosclerotic calcifications are present in the cavernous internal carotid arteries bilaterally without a  significant stenosis relative to the more distal vessel. The ICA termini are normal bilaterally. The left A1 segment is aplastic. The anterior communicating artery is patent. Both A2 segments fill from the right. The M1 segments are normal bilaterally. MCA bifurcations are intact. There is some attenuation of distal MCA branch vessels bilaterally. No significant proximal stenosis or occlusion is present. Posterior circulation: The left vertebral artery is the dominant vessel. PICA origins are visualized and normal. Vertebrobasilar junction is normal. The basilar artery is normal. The left posterior cerebral artery is of fetal type. The right posterior cerebral artery originates from the basilar tip. The PCA branch vessels demonstrate distal narrowing bilaterally. No significant proximal stenosis or occlusion is present. Venous sinuses: Dural sinuses are patent. Straight sinus deep cerebral veins are intact. Cortical  veins are unremarkable. Anatomic variants: Fetal type left posterior cerebral artery. Delayed phase: The delayed images demonstrate subtle enhancement of the posterior left frontal mass. No other focal lesions are present. The lesion measures 2.6 x 2.5 x 2.8 cm. No other focal lesions are present. There is no infarct on the postcontrast images. Review of the MIP images confirms the above findings IMPRESSION: 1. No significant large vessel occlusion. 2. Atherosclerotic changes at the carotid bifurcations and cavernous internal carotid arteries bilaterally without significant stenosis. 3. Moderate attenuation of distal branch vessels in the anterior and posterior circulation without a significant proximal stenosis or occlusion within the circle of Willis. 4. 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 Metastatic disease is considered less likely. MRI the brain without and with contrast would be useful for further evaluation. Electronically Signed   By:  San Morelle M.D.   On: 03/19/2019 16:30   Ct Angio Chest Pe W/cm &/or Wo Cm  Result Date: 03/19/2019 CLINICAL DATA:  Concern for pulmonary embolism. EXAM: CT ANGIOGRAPHY CHEST WITH CONTRAST TECHNIQUE: Multidetector CT imaging of the chest was performed using the standard protocol during bolus administration of intravenous contrast. Multiplanar CT image reconstructions and MIPs were obtained to evaluate the vascular anatomy. CONTRAST:  59m OMNIPAQUE IOHEXOL 350 MG/ML SOLN COMPARISON:  None. FINDINGS: Cardiovascular: The CTA is timed such that the aorta is opacified while the pulmonary arteries are poorly opacified. The this may in part be a secondary to patient condition as well as small contrast bolus. There is no definitive filling defects within the pulmonary arteries although poorly evaluated as above. No evidence of RIGHT ventricular strain. Coronary artery calcification and aortic atherosclerotic calcification. Aorta is normal. Mediastinum/Nodes: No axillary supraclavicular adenopathy. No mediastinal hilar adenopathy. No pericardial effusion. Lungs/Pleura: No pulmonary infarction.  No pleural fluid. Upper Abdomen: Limited view of the liver, kidneys, pancreas are unremarkable. Normal adrenal glands. Musculoskeletal: No aggressive osseous lesion. Review of the MIP images confirms the above findings. IMPRESSION: 1. No gross evidence acute pulmonary embolism on suboptimal exam. Consider anticoagulation and repeat exam when patient's renal function improves. 2. Coronary artery calcification and Aortic Atherosclerosis (ICD10-I70.0). 3. No pulmonary infarction or pneumonia. Electronically Signed   By: SSuzy BouchardM.D.   On: 03/19/2019 16:18   Mr BJeri CosWTSContrast  Result Date: 03/19/2019 CLINICAL DATA:  Abnormal CT of the head. Patient collapsed after a run. EXAM: MRI HEAD WITHOUT AND WITH CONTRAST TECHNIQUE: Multiplanar, multiecho pulse sequences of the brain and surrounding structures were  obtained without and with intravenous contrast. CONTRAST:  9 mL Gadavist COMPARISON:  CT head without contrast 03/19/2019 FINDINGS: Brain: Infiltrating mass is present in the left superior frontal gyrus. Tumor measures 2.6 x 4.2 x 3.5 cm. There is subtle enhancement associated with the lesion. The most significant enhancement is superiorly along the dura. There is no significant cystic component. There is focal cortical thickening and increased T2 signal in the posterior cingulate gyrus, best seen on images 17 and 18 of series 13 and image 12 of series 21. There is no enhancement associated with this area. No other focal lesions are present. Asymmetric T2 hyperintensity is present within the medial left temporal lobe and hippocampus. Mild periventricular T2 hyperintensities are otherwise within normal limits for age. The ventricles are of normal size. No significant extraaxial fluid collection is present. Vascular: Normal flow voids. Skull and upper cervical spine: Insert normal skull Sinuses/Orbits: Mild mucosal thickening is present in the  ethmoid air cells bilaterally. Polyps or mucous retention cysts are noted inferiorly in the maxillary sinuses. The mastoid air cells are clear. Globes and orbits are within normal limits. IMPRESSION: 1. Infiltrating tumor mass in the left superior frontal gyrus measures 2.6 x 4.2 x 3.5 cm. There is subtle enhancement. Focal enhancement is associated with the dura. 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 The differential diagnosis includes oligodendroglioma. Ganglioglioma or DNET could have a similar appearance but are atypical in this age group. Metastatic disease is considered unlikely with this appearance. 2. Additional focal area of T2 hyperintensity and cortical thickening posteriorly in the cingulate gyrus may represent a second area of tumor. 3. Subtle T2 hyperintensity in the medial left temporal lobe could be related to seizure activity. Electronically  Signed   By: San Morelle M.D.   On: 03/19/2019 18:39   Mr Cervical Spine Wo Contrast  Result Date: 03/19/2019 CLINICAL DATA:  Cervical spine trauma, myelopathy. Collapsed on floor after coming home from a run. EXAM: MRI CERVICAL SPINE WITHOUT CONTRAST TECHNIQUE: Multiplanar, multisequence MR imaging of the cervical spine was performed. No intravenous contrast was administered. COMPARISON:  CTA of the neck 03/19/2019 FINDINGS: Alignment: Slight retrolisthesis is evident at C3-4. Slight anterolisthesis is present at C7-T1, T1-2, T2-3, and T3-4. AP alignment is otherwise anatomic. Vertebrae: Edematous endplate marrow changes are noted posteriorly at C4-5. Chronic fatty endplate marrow changes are present at C3-4, C5-6, and C6-7. Cord: Normal signal and morphology are present in the cervical and upper thoracic spinal cord to the lowest imaged level, T3-4. Posterior Fossa, vertebral arteries, paraspinal tissues: Craniocervical junction is normal. Flow is present in the vertebral arteries bilaterally. Visualized intracranial contents are normal. Disc levels: C2-3: Mild uncovertebral spurring is present on the right. There is no significant stenosis. C3-4: A broad-based disc osteophyte complex is present. There is partial effacement of the ventral CSF. Uncovertebral and facet spurring contribute to moderate foraminal narrowing, left greater than right. C4-5: A broad-based disc osteophyte complex is present. There is effacement of ventral CSF and slight distortion of ventral surface the cord. The canal is narrowed to 8.5 mm. Severe right and moderate left foraminal narrowing is due to uncovertebral and facet disease. C5-6: A broad-based disc osteophyte complex present. There is effacement of ventral scratched at there is partial effacement ventral CSF. Moderate foraminal stenosis due to uncovertebral and facet disease is worse left than right. C6-7: A leftward disc osteophyte complex is present. There is  effacement of the ventral CSF. Moderate left and mild right foraminal narrowing is present. C7-T1: Moderate facet hypertrophy is worse left than right. There is no significant disc herniation or stenosis. IMPRESSION: 1. No acute abnormality. 2. Multilevel degenerative disc disease as described. 3. Mild central and moderate bilateral foraminal narrowing at C3-4 is worse left than right. 4. 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 This is the most severe level. This is also the only level with edematous endplate marrow changes, suggesting the most recent progression. 5. Mild central and moderate bilateral foraminal stenosis at C5-6 is worse left than right. 6. Moderate left and mild right foraminal narrowing at C6-7. 7. Moderate facet hypertrophy at C7-T1 is worse on the left. There is no significant focal stenosis. Electronically Signed   By: San Morelle M.D.   On: 03/19/2019 17:53   Ct Head Code Stroke Wo Contrast  Result Date: 03/19/2019 CLINICAL DATA:  Code stroke.  Left-sided weakness. EXAM: CT HEAD WITHOUT CONTRAST  TECHNIQUE: Contiguous axial images were obtained from the base of the skull through the vertex without intravenous contrast. COMPARISON:  None. FINDINGS: Brain: No acute infarct is present. The insular ribbon is normal bilaterally. Basal ganglia are intact. There is a mass lesion in the high posterior left frontal lobe. Lesion blends with the gray matter. It measures approximately 2.6 x 2.5 x 3.0 cm. The tumor extends nearly to the corpus callosum. No other definite lesions are present. No significant white matter lesions are present. The ventricles are of normal size. The brainstem and cerebellum are within normal limits. No significant extraaxial fluid collection is present. Vascular: No hyperdense vessel or unexpected calcification. Skull: Insert normal calvarium no focal lytic or blastic lesions are present. Sinuses/Orbits: The  left ethmoid air cell is opacified. Polyps or mucous retention cysts are present along the inferior maxillary sinuses. The remaining paranasal sinuses and mastoid air cells are otherwise clear. The globes and orbits are within normal limits. ASPECTS [facility] Stroke Program Early CT Score) - Ganglionic level infarction (caudate, lentiform nuclei, internal capsule, insula, M1-M3 cortex): 7/7 - Supraganglionic infarction (M4-M6 cortex): 3/3 Total score (0-10 with 10 being normal): 10/10 IMPRESSION: 1. No acute infarct. 2. Mass lesion in the posterior high left frontal lobe may involve the primary motor cortex. 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 Metastatic disease should also be considered. Consider MRI the brain without and with contrast for further evaluation. 3. ASPECTS is 10/10 These results were called by telephone at the time of interpretation on 03/19/2019 at 3:31 pm to Dr. Leonel Ramsay , who verbally acknowledged these results. Electronically Signed   By: San Morelle M.D.   On: 03/19/2019 15:35    Pathology:  Assessment/Plan 1. Brain mass  2. Seizure [facility])  Mr. Guandique is clinically stable following last weekend's hospitalization for first ever seizure.  He has not experienced further events on Keppra 572m BID.  Left frontal lesion has accompanying satellite region mesially which represents additional focus of disease, or less likely, neuro-inflammation 2/2 seizure activity.  We extensively discussed differential diagnosis and options for further monitoring and workup.  We recommended a follow up MRI in 2-3 months for better characterization of change over time, and also better characterization of the left parafalcine T2 signal of unclear etiology.  This case was discussed in tumor board on 03/21/19.  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  He is no longer driving.  We appreciate the opportunity to participate in  the care of [employer]  He should return to clinic in September following next MRI brain, or sooner if clinically indicated.  Screening for potential clinical trials was performed and discussed using eligibility criteria [facility], as well as national database available on [email or web address]    The patient is not a candidate for a research protocol at this time due to no suitable study identified.   We spent twenty additional minutes teaching regarding the natural history, biology, and historical experience in the treatment of brain tumors. We then discussed in detail the current recommendations for therapy focusing on the mode of administration, mechanism of action, anticipated toxicities, and quality of life issues associated with this plan. We also provided teaching sheets for the patient to take home as an additional resource.  All questions were answered. The patient knows to call the clinic with any problems, questions or concerns. No barriers to learning were detected.  The  total time spent in the encounter was 60 minutes and more than 50% was on counseling and review of test results   Ventura Sellers, MD Medical Director of Neuro-Oncology [facility] [place] 03/24/19 3:22 PM

## 2019-03-25 ENCOUNTER — Telehealth: Payer: Self-pay | Admitting: Internal Medicine

## 2019-03-25 NOTE — Telephone Encounter (Signed)
Scheduled appt per 6/18 los. Spoke with patient and he is aware of his appt date and time.

## 2019-04-29 ENCOUNTER — Telehealth: Payer: Self-pay | Admitting: Internal Medicine

## 2019-04-29 NOTE — Telephone Encounter (Signed)
Scheduled appt per 7/24 sch  Message - pt is aware of appt date and time

## 2019-05-02 ENCOUNTER — Inpatient Hospital Stay: Payer: Medicare Other | Attending: Internal Medicine | Admitting: Internal Medicine

## 2019-05-02 ENCOUNTER — Other Ambulatory Visit: Payer: Self-pay

## 2019-05-02 VITALS — BP 140/84 | HR 90 | Temp 99.1°F | Resp 18 | Ht 72.0 in | Wt 193.8 lb

## 2019-05-02 DIAGNOSIS — R454 Irritability and anger: Secondary | ICD-10-CM | POA: Insufficient documentation

## 2019-05-02 DIAGNOSIS — G9389 Other specified disorders of brain: Secondary | ICD-10-CM | POA: Insufficient documentation

## 2019-05-02 DIAGNOSIS — R5383 Other fatigue: Secondary | ICD-10-CM | POA: Diagnosis not present

## 2019-05-02 DIAGNOSIS — F39 Unspecified mood [affective] disorder: Secondary | ICD-10-CM | POA: Diagnosis not present

## 2019-05-02 DIAGNOSIS — R569 Unspecified convulsions: Secondary | ICD-10-CM | POA: Insufficient documentation

## 2019-05-02 MED ORDER — LACOSAMIDE 50 MG PO TABS: 50.0000 mg | ORAL_TABLET | Freq: Two times a day (BID) | ORAL | 3 refills | Status: AC

## 2019-05-02 NOTE — Progress Notes (Signed)
[facility] at [address] [place] [place], [place] [address] [phone]   Interval Evaluation  Date of Service: 05/02/19 Patient Name: Brent Coleman Patient MRN: [id number] Patient DOB: Apr 03, 1941 Provider: Ventura Sellers, MD  Identifying Statement:  Brent Coleman is a 78 y.o. male with left frontal brain mass   Oncologic History: Oncology History   No history exists.    Interval History: Brent Coleman presents today for follow up because of recent clinical changes.  He describes increasing irritability, mood swings.  Fatigue is more prominent than prior.  In addition, he describes feeling "a little off balance" especially when balancing one leg to put pants on in the morning.  Continues to take Keppra 500mg  twice per day, doesn't describe any further seizures.  H+P (03/24/19) Brent Coleman presented to medical attention this past weekend after experiencing his first ever seizure.  Event was witnessed by wife, described as "staring ahead unresponsive with eyes open, rocking back and forth", he then "fell forward and continued period of unresponsiveness for 1-2 minutes".  Patient regained awareness once EMS arrived, and returned to baseline 1-2 hours later.  No provocation identified aside from high burden of stress recently.  No seizure history or seizure risk factors.  Brain MRI demonstrated non-enhancing left frontal mass, prompting consultation here today.  Since discharge, he has tolerated keppra well without further events.   Medications: Current Outpatient Medications on File Prior to Visit  Medication Sig Dispense Refill  . Calcium Carbonate Antacid (TUMS PO) Take 2 tablets by mouth at bedtime as needed (acid reflux).    Marland Kitchen levETIRAcetam (KEPPRA) 500 MG tablet Take 1 tablet (500 mg total) by mouth 2 (two) times daily. 60 tablet 2  . lisinopril (ZESTRIL) 20 MG tablet Take 1 tablet (20 mg total) by mouth daily. 90 tablet 1  . Multiple  Vitamins-Minerals (MULTIVITAMIN WITH MINERALS) tablet Take 1 tablet by mouth daily.       No current facility-administered medications on file prior to visit.     Allergies:  Allergies  Allergen Reactions  . Doxazosin Mesylate Other (See Comments)    REACTION: Abdominal pain   Past Medical History:  Past Medical History:  Diagnosis Date  . Basal cell carcinoma    sees derm q year  . 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 Gallstone   . GERD (gastroesophageal reflux disease)   . Hyperlipidemia   . Hypertension   . Malignant neoplasm of prostate [facility]) ~ 2007   sees urology q year   Past Surgical History:  Past Surgical History:  Procedure Laterality Date  . POLYPECTOMY    . TONSILLECTOMY  1949   Social History:  Social History   Socioeconomic History  . Marital status: Married    Spouse name: Not on file  . Number of children: 2  . Years of education: Not on file  . Highest education level: Not on file  Occupational History  . Occupation: retired--  Scientific laboratory technician  . Financial resource strain: Not on file  . Food insecurity    Worry: Not on file    Inability: Not on file  . Transportation needs    Medical: Not on file    Non-medical: Not on file  Tobacco Use  . Smoking status: Never Smoker  . Smokeless tobacco: Never Used  Substance and Sexual Activity  . Alcohol use: Yes  . Drug use: No  .  Sexual activity: Yes    Partners: Female  Lifestyle  . Physical activity    Days per week: Not on file    Minutes per session: Not on file  . Stress: Not on file  Relationships  . Social Herbalist on phone: Not on file    Gets together: Not on file    Attends religious service: Not on file    Active member of club or organization: Not on file    Attends meetings of clubs or organizations: Not on file    Relationship status: Not on file  . Intimate partner violence    Fear of current or ex  partner: Not on file    Emotionally abused: Not on file    Physically abused: Not on file    Forced sexual activity: Not on file  Other Topics Concern  . Not on file  Social History Narrative   HS[employer]. College grad.    Married/ marriage is in good health. 2 Daughters. 2 grandchildren living in [place].    Work - retired from [employer] @ 48 and has enjoyed his retirement.         Family History:  Family History  Problem Relation Age of Onset  . Colon cancer Mother 63  . Colon cancer Father 34       colon  . Hypertension Other   . Stroke Other   . CAD Neg Hx   . Prostate cancer Neg Hx   . Diabetes Neg Hx     Review of Systems: Constitutional: Denies fevers, chills or abnormal weight loss Eyes: Denies blurriness of vision Ears, nose, mouth, throat, and face: Denies mucositis or sore throat Respiratory: Denies cough, dyspnea or wheezes Cardiovascular: Denies palpitation, chest discomfort or lower extremity swelling Gastrointestinal:  Denies nausea, constipation, diarrhea GU: Denies dysuria or incontinence Skin: Denies abnormal skin rashes Neurological: Per HPI Musculoskeletal: Denies joint pain, back or neck discomfort. No decrease in ROM Behavioral/Psych: Denies anxiety, disturbance in thought content, and mood instability  Physical Exam: Vitals:   05/02/19 1206  BP: 140/84  Pulse: 90  Resp: 18  Temp: 99.1 F (37.3 C)  SpO2: 98%   KPS: 90. General: Alert, cooperative, pleasant, in no acute distress Head: Normal EENT: No conjunctival injection or scleral icterus. Oral mucosa moist Lungs: Resp effort normal Cardiac: Regular rate and rhythm Abdomen: Soft, non-distended abdomen Skin: No rashes cyanosis or petechiae. Extremities: No clubbing or edema  Neurologic Exam: Mental Status: Awake, alert, attentive to examiner. Oriented to self and environment. Language is fluent with intact comprehension.  Cranial Nerves: Visual acuity is grossly normal. Visual fields are full.  Extra-ocular movements intact. No ptosis. Face is symmetric, tongue midline. Motor: Tone and bulk are normal. Power is full in both arms and legs. Reflexes are symmetric, no pathologic reflexes present. Intact finger to nose bilaterally Sensory: Intact to light touch and temperature Gait: Normal and tandem gait is inconsistent.   Labs: I have reviewed the data as listed    Component Value Date/Time   NA 138 03/20/2019 0528   K 3.9 03/20/2019 0528   CL 109 03/20/2019 0528   CO2 22 03/20/2019 0528   GLUCOSE 109 (H) 03/20/2019 0528   BUN 18 03/20/2019 0528   CREATININE 1.39 (H) 03/20/2019 0528   CALCIUM 8.6 (L) 03/20/2019 0528   PROT 5.6 (L) 03/19/2019 1505   ALBUMIN 3.5 03/19/2019 1505   AST 26 03/19/2019 1505   ALT 19 03/19/2019 1505   ALKPHOS  49 03/19/2019 1505   BILITOT 2.0 (H) 03/19/2019 1505   GFRNONAA 48 (L) 03/20/2019 0528   GFRAA 55 (L) 03/20/2019 0528   Lab Results  Component Value Date   WBC 8.0 03/20/2019   NEUTROABS 5.2 03/19/2019   HGB 14.2 03/20/2019   HCT 38.9 (L) 03/20/2019   MCV 94.2 03/20/2019   PLT 127 (L) 03/20/2019     Assessment/Plan 1. Brain mass  2. Seizure [facility])  Mr. Horsey describes clinical changes today that are likely side effects of Keppra therapy.  They are not necessarily localizable and don't point to concern for acute neurologic process or tumor progression.    We recommended transitioning from Keppra to Vimpat monotherapy.  He will begin dosing Vimpat 50mg  BID, then stop Keppra after two days of "bridging".    He will call us with any further side effects or breakthrough seizures.  We appreciate the opportunity to participate in the care of [employer].  He should return to clinic in September following next MRI brain, or sooner if clinically indicated.  All questions were answered. The patient knows to call the clinic with any problems, questions or concerns. No barriers to learning were detected.  The total time spent in  the encounter was 25 minutes and more than 50% was on counseling and review of test results   Ventura Sellers, MD Medical Director of Neuro-Oncology [facility] [place] 05/02/19 12:02 PM

## 2019-05-03 ENCOUNTER — Telehealth: Payer: Self-pay | Admitting: *Deleted

## 2019-05-03 ENCOUNTER — Telehealth: Payer: Self-pay | Admitting: Internal Medicine

## 2019-05-03 NOTE — Telephone Encounter (Signed)
No los per 7/27. °

## 2019-05-03 NOTE — Telephone Encounter (Signed)
Patient called to advise that during his visit Dr Mickeal Skinner gave him the option to move up his MRI since he was mentioning some worensing of issues with his legs.  He declined at that time.  He called today to report that he gave it more thought and wants to proceed with having the MRI scan sooner than September since he has noticed he is having issues with writing checks, and using the keyboard.    Dr. Mickeal Skinner is agreeable with him moving up his scan.  Patient will call GI and get first available MRI appt.  He mentioned having some issues with confined spaces.  Advised to let us know if he continues to have issues as some premedication could be considered.

## 2019-05-05 ENCOUNTER — Emergency Department (HOSPITAL_COMMUNITY): Payer: Medicare Other

## 2019-05-05 ENCOUNTER — Other Ambulatory Visit: Payer: Self-pay

## 2019-05-05 ENCOUNTER — Other Ambulatory Visit: Payer: Self-pay | Admitting: *Deleted

## 2019-05-05 ENCOUNTER — Encounter (HOSPITAL_COMMUNITY): Payer: Self-pay

## 2019-05-05 ENCOUNTER — Telehealth: Payer: Self-pay | Admitting: *Deleted

## 2019-05-05 ENCOUNTER — Observation Stay (HOSPITAL_COMMUNITY)
Admission: EM | Admit: 2019-05-05 | Discharge: 2019-05-06 | Disposition: A | Discharge status: Home / Self Care | Payer: Medicare Other | Attending: Internal Medicine | Admitting: Internal Medicine

## 2019-05-05 ENCOUNTER — Ambulatory Visit: Admit: 2019-05-05 | Discharge: 2019-05-06 | Payer: MEDICARE

## 2019-05-05 DIAGNOSIS — C719 Malignant neoplasm of brain, unspecified: Secondary | ICD-10-CM | POA: Diagnosis not present

## 2019-05-05 DIAGNOSIS — Z923 Personal history of irradiation: Secondary | ICD-10-CM | POA: Insufficient documentation

## 2019-05-05 DIAGNOSIS — R569 Unspecified convulsions: Secondary | ICD-10-CM

## 2019-05-05 DIAGNOSIS — D696 Thrombocytopenia, unspecified: Secondary | ICD-10-CM

## 2019-05-05 DIAGNOSIS — Z85828 Personal history of other malignant neoplasm of skin: Secondary | ICD-10-CM | POA: Insufficient documentation

## 2019-05-05 DIAGNOSIS — K219 Gastro-esophageal reflux disease without esophagitis: Secondary | ICD-10-CM | POA: Insufficient documentation

## 2019-05-05 DIAGNOSIS — E785 Hyperlipidemia, unspecified: Secondary | ICD-10-CM | POA: Diagnosis not present

## 2019-05-05 DIAGNOSIS — Z8546 Personal history of malignant neoplasm of prostate: Secondary | ICD-10-CM | POA: Insufficient documentation

## 2019-05-05 DIAGNOSIS — Z79899 Other long term (current) drug therapy: Secondary | ICD-10-CM | POA: Diagnosis not present

## 2019-05-05 DIAGNOSIS — N183 Chronic kidney disease, stage 3 unspecified: Secondary | ICD-10-CM | POA: Diagnosis present

## 2019-05-05 DIAGNOSIS — I10 Essential (primary) hypertension: Secondary | ICD-10-CM

## 2019-05-05 DIAGNOSIS — G9389 Other specified disorders of brain: Secondary | ICD-10-CM

## 2019-05-05 DIAGNOSIS — Z8249 Family history of ischemic heart disease and other diseases of the circulatory system: Secondary | ICD-10-CM | POA: Insufficient documentation

## 2019-05-05 DIAGNOSIS — R531 Weakness: Secondary | ICD-10-CM

## 2019-05-05 DIAGNOSIS — I129 Hypertensive chronic kidney disease with stage 1 through stage 4 chronic kidney disease, or unspecified chronic kidney disease: Secondary | ICD-10-CM | POA: Diagnosis not present

## 2019-05-05 DIAGNOSIS — Z20828 Contact with and (suspected) exposure to other viral communicable diseases: Secondary | ICD-10-CM | POA: Diagnosis not present

## 2019-05-05 DIAGNOSIS — Z1159 Encounter for screening for other viral diseases: Secondary | ICD-10-CM | POA: Insufficient documentation

## 2019-05-05 DIAGNOSIS — G939 Disorder of brain, unspecified: Secondary | ICD-10-CM | POA: Diagnosis not present

## 2019-05-05 LAB — CBC WITH DIFFERENTIAL/PLATELET
Abs Immature Granulocytes: 0.02 10*3/uL (ref 0.00–0.07)
Basophils Absolute: 0 10*3/uL (ref 0.0–0.1)
Basophils Relative: 0 %
Eosinophils Absolute: 0.1 10*3/uL (ref 0.0–0.5)
Eosinophils Relative: 2 %
HCT: 43.4 % (ref 39.0–52.0)
Hemoglobin: 15.8 g/dL (ref 13.0–17.0)
Immature Granulocytes: 0 %
Lymphocytes Relative: 23 %
Lymphs Abs: 1.2 10*3/uL (ref 0.7–4.0)
MCH: 34.6 pg — ABNORMAL HIGH (ref 26.0–34.0)
MCHC: 36.4 g/dL — ABNORMAL HIGH (ref 30.0–36.0)
MCV: 95 fL (ref 80.0–100.0)
Monocytes Absolute: 0.5 10*3/uL (ref 0.1–1.0)
Monocytes Relative: 10 %
Neutro Abs: 3.3 10*3/uL (ref 1.7–7.7)
Neutrophils Relative %: 65 %
Platelets: 137 10*3/uL — ABNORMAL LOW (ref 150–400)
RBC: 4.57 MIL/uL (ref 4.22–5.81)
RDW: 11.8 % (ref 11.5–15.5)
WBC: 5.1 10*3/uL (ref 4.0–10.5)
nRBC: 0 % (ref 0.0–0.2)

## 2019-05-05 LAB — PROTIME-INR
INR: 1.1 (ref 0.8–1.2)
Prothrombin Time: 13.6 seconds (ref 11.4–15.2)

## 2019-05-05 LAB — COMPREHENSIVE METABOLIC PANEL
ALT: 21 U/L (ref 0–44)
AST: 24 U/L (ref 15–41)
Albumin: 4.3 g/dL (ref 3.5–5.0)
Alkaline Phosphatase: 53 U/L (ref 38–126)
Anion gap: 10 (ref 5–15)
BUN: 19 mg/dL (ref 8–23)
CO2: 21 mmol/L — ABNORMAL LOW (ref 22–32)
Calcium: 8.6 mg/dL — ABNORMAL LOW (ref 8.9–10.3)
Chloride: 103 mmol/L (ref 98–111)
Creatinine, Ser: 1.4 mg/dL — ABNORMAL HIGH (ref 0.61–1.24)
GFR calc Af Amer: 55 mL/min — ABNORMAL LOW (ref >60)
GFR calc non Af Amer: 47 mL/min — ABNORMAL LOW (ref >60)
Glucose, Bld: 105 mg/dL — ABNORMAL HIGH (ref 70–99)
Potassium: 3.6 mmol/L (ref 3.5–5.1)
Sodium: 134 mmol/L — ABNORMAL LOW (ref 135–145)
Total Bilirubin: 1.5 mg/dL — ABNORMAL HIGH (ref 0.3–1.2)
Total Protein: 6.7 g/dL (ref 6.5–8.1)

## 2019-05-05 LAB — APTT: aPTT: 29 seconds (ref 24–36)

## 2019-05-05 MED ORDER — LORAZEPAM 2 MG/ML IJ SOLN
1.0000 mg | Freq: Once | INTRAMUSCULAR | Status: AC | PRN
  Administered 2019-05-05: 1 mg via INTRAVENOUS
  Filled 2019-05-05: qty 1

## 2019-05-05 MED ORDER — GADOBUTROL 1 MMOL/ML IV SOLN
10.0000 mL | Freq: Once | INTRAVENOUS | Status: AC | PRN
  Administered 2019-05-05: 10 mL via INTRAVENOUS

## 2019-05-05 MED ORDER — DEXAMETHASONE SODIUM PHOSPHATE 10 MG/ML IJ SOLN
6.0000 mg | Freq: Four times a day (QID) | INTRAMUSCULAR | Status: DC
  Administered 2019-05-05 – 2019-05-06 (×3): 6 mg via INTRAVENOUS
  Filled 2019-05-05 (×2): qty 0.6
  Filled 2019-05-05: qty 1
  Filled 2019-05-05: qty 0.6

## 2019-05-05 MED ORDER — LORAZEPAM 1 MG PO TABS: 1.0000 mg | ORAL_TABLET | Freq: Once | ORAL | Status: AC | PRN

## 2019-05-05 NOTE — ED Notes (Signed)
Pt transported to MRI 

## 2019-05-05 NOTE — ED Notes (Signed)
Pt back from MRI 

## 2019-05-05 NOTE — ED Triage Notes (Signed)
Pt states that he has a brain tumor and saw his doctor on Monday, shortly after that he states that his right side became weak, his arm and leg, he states it's worse than it was Monday

## 2019-05-05 NOTE — ED Provider Notes (Signed)
[facility] DEPT Provider Note   CSN: [id number] Arrival date & time: 05/05/19  1926    History   Chief Complaint Chief Complaint  Patient presents with  . right sided weakness    HPI Brent Coleman is a 78 y.o. male.     The history is provided by the patient and medical records. No language interpreter was used.   Brent Coleman is a 78 y.o. male  with a PMH of known brain mass followed by Dr. Tamala Julian of oncology who presents to the Emergency Department complaining of progressively worsening right sided arm and leg weakness for the last 2 days.  Patient reports difficulty with ADL's such as tying his shoes.  He reports seeing Dr. Tamala Julian on Monday with plan for MRI to further evaluate his brain mass.  He states that since the appointment really, he is just noticed a progressive decline in his right-sided abilities.  Has some unsteadiness with gait at baseline but denies any worsening of this.  Denies any trouble with his speech.  Past Medical History:  Diagnosis Date  . Basal cell carcinoma    sees derm q year  . 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 Gallstone   . GERD (gastroesophageal reflux disease)   . Hyperlipidemia   . Hypertension   . Malignant neoplasm of prostate [facility]) ~ 2007   sees urology q year    Patient Active Problem List   Diagnosis Date Noted  . Thrombocytopenia ([place]) 05/05/2019  . Right sided weakness 05/05/2019  . Brain mass 03/19/2019  . V tach ([place]) 03/19/2019  . Hypokalemia 03/19/2019  . Seizure ([place]) 03/19/2019  . GERD (gastroesophageal reflux disease) 10/20/2017  . Annual physical exam 07/24/2015  . PCP NOTES >>>>>>>>> 07/24/2015  . Lipoma of thigh 11/28/2013  . CKD (chronic kidney disease), stage III ([place]) 09/13/2013  . Routine general medical examination at a health care facility 05/02/2011  . DIVERTICULOSIS OF COLON 11/30/2009  . PERSONAL HX  COLONIC POLYPS 11/30/2009  . CHOLELITHIASIS 11/14/2009  . BCC (basal cell carcinoma of skin) 10/14/2008  . HYPERLIPIDEMIA 10/14/2008  . History of prostate cancer 05/09/2007  . Essential hypertension 05/09/2007    Past Surgical History:  Procedure Laterality Date  . POLYPECTOMY    . TONSILLECTOMY  1949        Home Medications    Prior to Admission medications   Medication Sig Start Date End Date Taking? Authorizing Provider  Calcium Carbonate Antacid (TUMS PO) Take 2 tablets by mouth at bedtime as needed (acid reflux).   Yes [provider]  lacosamide (VIMPAT) 50 MG TABS tablet Take 1 tablet (50 mg total) by mouth 2 (two) times daily. 05/02/19  Yes Vaslow, Acey Lav, MD  lisinopril (ZESTRIL) 20 MG tablet Take 1 tablet (20 mg total) by mouth daily. 02/11/19  Yes Paz, Alda Berthold, MD  Multiple Vitamins-Minerals (MULTIVITAMIN WITH MINERALS) tablet Take 1 tablet by mouth daily.     Yes [provider]    Family History Family History  Problem Relation Age of Onset  . Colon cancer Mother 65  . Colon cancer Father 35       colon  . Hypertension Other   . Stroke Other   . CAD Neg Hx   . Prostate cancer Neg Hx   . Diabetes Neg Hx     Social History Social History   Tobacco Use  . Smoking status: Never  Smoker  . Smokeless tobacco: Never Used  Substance Use Topics  . Alcohol use: Yes  . Drug use: No     Allergies   Doxazosin mesylate   Review of Systems Review of Systems  Neurological: Positive for weakness.  All other systems reviewed and are negative.    Physical Exam Updated Vital Signs BP (!) 142/80   Pulse 67   Temp 98 F (36.7 C) (Oral)   Resp 11   SpO2 97%   Physical Exam Vitals signs and nursing note reviewed.  Constitutional:      General: He is not in acute distress.    Appearance: He is well-developed.  HENT:     Head: Normocephalic and atraumatic.  Neck:     Musculoskeletal: Neck supple.  Cardiovascular:     Rate and  Rhythm: Normal rate and regular rhythm.     Heart sounds: Normal heart sounds. No murmur.  Pulmonary:     Effort: Pulmonary effort is normal. No respiratory distress.     Breath sounds: Normal breath sounds.  Abdominal:     General: There is no distension.     Palpations: Abdomen is soft.     Tenderness: There is no abdominal tenderness.  Skin:    General: Skin is warm and dry.  Neurological:     Mental Status: He is alert and oriented to person, place, and time.     Motor: Weakness present.     Comments: RUE and RLE weakness. Decreased grip strength on right as well.       ED Treatments / Results  Labs (all labs ordered are listed, but only abnormal results are displayed) Labs Reviewed  CBC WITH DIFFERENTIAL/PLATELET - Abnormal; Notable for the following components:      Result Value   MCH 34.6 (*)    MCHC 36.4 (*)    Platelets 137 (*)    All other components within normal limits  COMPREHENSIVE METABOLIC PANEL - Abnormal; Notable for the following components:   Sodium 134 (*)    CO2 21 (*)    Glucose, Bld 105 (*)    Creatinine, Ser 1.40 (*)    Calcium 8.6 (*)    Total Bilirubin 1.5 (*)    GFR calc non Af Amer 47 (*)    GFR calc Af Amer 55 (*)    All other components within normal limits  SARS CORONAVIRUS 2 (HOSPITAL ORDER, [facility])  APTT  PROTIME-INR  URINALYSIS, ROUTINE W REFLEX MICROSCOPIC    EKG None  Radiology Mr Jeri Cos And Wo Contrast  Result Date: 05/05/2019 CLINICAL DATA:  Brain tumor.  Worsening right-sided weakness EXAM: MRI HEAD WITHOUT AND WITH CONTRAST TECHNIQUE: Multiplanar, multiecho pulse sequences of the brain and surrounding structures were obtained without and with intravenous contrast. CONTRAST:  10 mL Gadovist IV COMPARISON:  MRI head 03/19/2019 FINDINGS: Brain: Rapidly enlarging mass in the left posterior frontal lobe. Marked progression of enhancement of the mass. The mass now shows extensive peripheral  enhancement with central necrosis and measures 44 x 32 x 39 mm. Previously, there was only a small area of enhancement over the convexity which appeared to be on the surface of the brain. Edema surrounding the enhancing component of the mass has progressed. Local mass-effect on the left lateral ventricle without significant midline shift. The lesion does not show restricted diffusion. Interval development of small amount of hemorrhage within the lesion. No acute infarct. No other lesions identified. Vascular: Normal arterial flow  voids Skull and upper cervical spine: Negative Sinuses/Orbits: Mild mucosal edema paranasal sinuses.  Normal orbit Other: None IMPRESSION: Rapid interval growth of left posterior frontal mass lesion. The lesion now shows extensive enhancement with central necrosis and a small amount of internal hemorrhage. Increased surrounding mass-effect and edema. 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 Rapidly growing oligometastatic disease considered less likely. Electronically Signed   By: Franchot Gallo M.D.   On: 05/05/2019 21:52    Procedures Procedures (including critical care time)  Medications Ordered in ED Medications  dexamethasone (DECADRON) injection 6 mg (has no administration in time range)  LORazepam (ATIVAN) tablet 1 mg ( Oral See Alternative 05/05/19 2100)    Or  LORazepam (ATIVAN) injection 1 mg (1 mg Intravenous Given 05/05/19 2100)  gadobutrol (GADAVIST) 1 MMOL/ML injection 10 mL (10 mLs Intravenous Contrast Given 05/05/19 2126)     Initial Impression / Assessment and Plan / ED Course  I have reviewed the triage vital signs and the nursing notes.  Pertinent labs & imaging results that were available during my care of the patient were reviewed by me and considered in my medical decision making (see chart for details).       Brent Coleman is a 78 y.o. male who presents to ED for aggressively worsening right upper and lower extremity weakness since  seeing his oncologist on Monday.  MRI today shows rapid interval growth of his left posterior frontal mass lesion now with extensive enhancement with central necrosis and a small amount of internal hemorrhage with increased edema as well.  Discussed with on-call neurosurgery, Dr. Ellene Route, who recommends medical admission and will see in the morning.  Will start on 6 mg of Decadron Q6H. hospitalist consulted who will admit.  Patient seen by and discussed with Dr. Tyrone Nine who agrees with treatment plan.     Final Clinical Impressions(s) / ED Diagnoses   Final diagnoses:  Brain mass    ED Discharge Orders    None       Arpi Diebold, Ozella Almond, PA-C 05/05/19 [place], [place], DO 05/09/19 0710

## 2019-05-05 NOTE — Progress Notes (Signed)
Patient complains of worsening symptoms since last being seen.  He wasn't able to get his MRI moved up much and wants to see if he can have it done sooner.

## 2019-05-05 NOTE — H&P (Signed)
History and Physical    Brent Coleman ZCH:[id number] DOB: 1941/02/09 DOA: 05/05/2019  PCP: Colon Branch, MD   Patient coming from: Home   Chief Complaint: Worsening right-sided weakness   HPI: Brent Coleman is a 78 y.o. male with medical history significant for hypertension, GERD, prostate cancer status post radiation therapy remotely, chronic kidney disease stage III, and brain mass identified in June 2020 during work-up of a for seizure, now presenting to the emergency department with worsening right-sided weakness.  Patient has been following with oncology since the recent identification of his brain mass, and was planned for repeat MRI for further characterization, but with right-sided deficits worsening fairly quickly, he now comes in for evaluation.  Patient reports that he was having some right upper and lower extremity weakness when he saw his oncologist on 05/02/2019, there was plan to move up the scheduled MRI to a closer date, but the right-sided deficits have worsened significantly in the past 3 days.  Patient describes dragging his right leg, unable to type on a keyboard or complete tasks with the right hand, but denies any significant headache and denies any difficulty swallowing or choking.  Patient has not noticed any fevers or chills, denies cough or shortness of breath, and does not know of any sick contacts.  ED Course: Upon arrival to the ED, patient is found to be afebrile, saturating well on room air, and with stable blood pressure.  Chemistry panel is notable for a sodium of 134, bilirubin 1.5, and creatinine 1.40.  CBC features a slight thrombocytopenia.  MRI brain reveals rapid interval growth of left posterior frontal mass lesion, now with extensive enhancement, central necrosis, and a small amount of internal hemorrhage, as well as increased surrounding mass-effect and edema, with findings most concerning for glioblastoma.  Neurosurgery was consulted by ED physician and  it was recommended that the patient be started on Decadron and admitted to the medical service.  Review of Systems:  All other systems reviewed and apart from HPI, are negative.  Past Medical History:  Diagnosis Date  . Basal cell carcinoma    sees derm q year  . 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 Gallstone   . GERD (gastroesophageal reflux disease)   . Hyperlipidemia   . Hypertension   . Malignant neoplasm of prostate [facility]) ~ 2007   sees urology q year    Past Surgical History:  Procedure Laterality Date  . POLYPECTOMY    . TONSILLECTOMY  1949     reports that he has never smoked. He has never used smokeless tobacco. He reports current alcohol use. He reports that he does not use drugs.  Allergies  Allergen Reactions  . Doxazosin Mesylate Other (See Comments)    REACTION: Abdominal pain    Family History  Problem Relation Age of Onset  . Colon cancer Mother 70  . Colon cancer Father 101       colon  . Hypertension Other   . Stroke Other   . CAD Neg Hx   . Prostate cancer Neg Hx   . Diabetes Neg Hx      Prior to Admission medications   Medication Sig Start Date End Date Taking? Authorizing Provider  Calcium Carbonate Antacid (TUMS PO) Take 2 tablets by mouth at bedtime as needed (acid reflux).   Yes [provider]  lacosamide (VIMPAT) 50 MG TABS tablet Take 1 tablet (50 mg total) by  mouth 2 (two) times daily. 05/02/19  Yes Vaslow, Acey Lav, MD  lisinopril (ZESTRIL) 20 MG tablet Take 1 tablet (20 mg total) by mouth daily. 02/11/19  Yes Paz, Alda Berthold, MD  Multiple Vitamins-Minerals (MULTIVITAMIN WITH MINERALS) tablet Take 1 tablet by mouth daily.     Yes [provider]    Physical Exam: Vitals:   05/05/19 2140 05/05/19 2141 05/05/19 2200 05/05/19 2230  BP: (!) 152/87  139/89 (!) 142/80  Pulse: 70 69 67 67  Resp: 11 14 18 11   Temp:      TempSrc:      SpO2: 97% 98% 96% 97%     Constitutional: NAD, calm  Eyes: PERTLA, lids and conjunctivae normal ENMT: Mucous membranes are moist. Posterior pharynx clear of any exudate or lesions.   Neck: normal, supple, no masses, no thyromegaly Respiratory: no wheezing, no crackles. Normal respiratory effort. No accessory muscle use.  Cardiovascular: S1 & S2 heard, regular rate and rhythm. No extremity edema.   Abdomen: No distension, no tenderness, soft. Bowel sounds normal.  Musculoskeletal: no clubbing / cyanosis. No joint deformity upper and lower extremities.    Skin: no significant rashes, lesions, ulcers. Warm, dry, well-perfused. Neurologic: No gross facial asymmetry. Mild dysarthria. No aphasia. Sensation to light touch intact. Strength 5/5 throughout left side. Strength 3-4/5 involving proximal RLE and 3/5 distally, 3/5 throughout RUE. Psychiatric:  Alert and oriented to person, place, and situation. Pleasant, cooperative.    Labs on Admission: I have personally reviewed following labs and imaging studies  CBC: Recent Labs  Lab 05/05/19 2019  WBC 5.1  NEUTROABS 3.3  HGB 15.8  HCT 43.4  MCV 95.0  PLT 169*   Basic Metabolic Panel: Recent Labs  Lab 05/05/19 2019  NA 134*  K 3.6  CL 103  CO2 21*  GLUCOSE 105*  BUN 19  CREATININE 1.40*  CALCIUM 8.6*   GFR: Estimated Creatinine Clearance: 46.2 mL/min (A) (by C-G formula based on SCr of 1.4 mg/dL (H)). Liver Function Tests: Recent Labs  Lab 05/05/19 2019  AST 24  ALT 21  ALKPHOS 53  BILITOT 1.5*  PROT 6.7  ALBUMIN 4.3   No results for input(s): LIPASE, AMYLASE in the last 168 hours. No results for input(s): AMMONIA in the last 168 hours. Coagulation Profile: Recent Labs  Lab 05/05/19 2019  INR 1.1   Cardiac Enzymes: No results for input(s): CKTOTAL, CKMB, CKMBINDEX, TROPONINI in the last 168 hours. BNP (last 3 results) No results for input(s): PROBNP in the last 8760 hours. HbA1C: No results for input(s): HGBA1C in the last 72 hours.  CBG: No results for input(s): GLUCAP in the last 168 hours. Lipid Profile: No results for input(s): CHOL, HDL, LDLCALC, TRIG, CHOLHDL, LDLDIRECT in the last 72 hours. Thyroid Function Tests: No results for input(s): TSH, T4TOTAL, FREET4, T3FREE, THYROIDAB in the last 72 hours. Anemia Panel: No results for input(s): VITAMINB12, FOLATE, FERRITIN, TIBC, IRON, RETICCTPCT in the last 72 hours. Urine analysis:    Component Value Date/Time   COLORURINE YELLOW 11/09/2009 [place] 11/09/2009 1355   LABSPEC 1.012 11/09/2009 1355   PHURINE 6.0 11/09/2009 1355   GLUCOSEU NEGATIVE 11/09/2009 1355   GLUCOSEU NEGATIVE 01/08/2009 0931   HGBUR NEGATIVE 11/09/2009 1355   BILIRUBINUR Negative 02/02/2017 1123   KETONESUR NEGATIVE 11/09/2009 1355   PROTEINUR Negative 02/02/2017 1123   PROTEINUR NEGATIVE 11/09/2009 1355   UROBILINOGEN 0.2 02/02/2017 1123   UROBILINOGEN 0.2 11/09/2009 1355   NITRITE Negative 02/02/2017  [place] 11/09/2009 1355   LEUKOCYTESUR Negative 02/02/2017 1123   Sepsis Labs: @LABRCNTIP (procalcitonin:4,lacticidven:4) )No results found for this or any previous visit (from the past 240 hour(s)).   Radiological Exams on Admission: Mr Jeri Cos And Wo Contrast  Result Date: 05/05/2019 CLINICAL DATA:  Brain tumor.  Worsening right-sided weakness EXAM: MRI HEAD WITHOUT AND WITH CONTRAST TECHNIQUE: Multiplanar, multiecho pulse sequences of the brain and surrounding structures were obtained without and with intravenous contrast. CONTRAST:  10 mL Gadovist IV COMPARISON:  MRI head 03/19/2019 FINDINGS: Brain: Rapidly enlarging mass in the left posterior frontal lobe. Marked progression of enhancement of the mass. The mass now shows extensive peripheral enhancement with central necrosis and measures 44 x 32 x 39 mm. Previously, there was only a small area of enhancement over the convexity which appeared to be on the surface of the brain. Edema surrounding the  enhancing component of the mass has progressed. Local mass-effect on the left lateral ventricle without significant midline shift. The lesion does not show restricted diffusion. Interval development of small amount of hemorrhage within the lesion. No acute infarct. No other lesions identified. Vascular: Normal arterial flow voids Skull and upper cervical spine: Negative Sinuses/Orbits: Mild mucosal edema paranasal sinuses.  Normal orbit Other: None IMPRESSION: Rapid interval growth of left posterior frontal mass lesion. The lesion now shows extensive enhancement with central necrosis and a small amount of internal hemorrhage. Increased surrounding mass-effect and edema. 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 Rapidly growing oligometastatic disease considered less likely. Electronically Signed   By: Franchot Gallo M.D.   On: 05/05/2019 21:52    EKG: Not performed.   Assessment/Plan   1. Brain mass; right-sided weakness; hx of seizure   - Left posterior frontal brain mass was identified in June 2020 when he presented with a first seizure, he has been on antiepileptics with no further seizure, but now presents for evaluation of progressive right-sided weakness and is found on MRI to have rapid interval growth of the mass  - Neurosurgery was consulted by ED physician and recommended starting Decadron  - Continue Decadron, Vimpat, neuro checks, supportive care    2. CKD III  - SCr is 1.40 on admission, similar to priors  - Renally-dose medications   3. Hypertension  - BP at goal, continue lisinopril    4. Thrombocytopenia  - This mild, chronic, and stable with small amount of hemorrhage within his brain mass but no active bleeding or evidence for infection     PPE: mask, face shield  DVT prophylaxis: SCD's  Code Status: Full  Family Communication: Discussed with patient  Consults called: NSG Admission status: Observation    Vianne Bulls, MD [facility] Pager  [phone]  If 7PM-7AM, please contact night-coverage [email or web address] Password [email or web address]  05/05/2019, 11:17 PM

## 2019-05-05 NOTE — ED Notes (Signed)
Patient transported to MRI 

## 2019-05-05 NOTE — Telephone Encounter (Signed)
Moved up patients MRI to first available at [facility] and was told to advise if things progressively worsened.    No further questions from patient at this time.

## 2019-05-06 ENCOUNTER — Other Ambulatory Visit: Payer: Self-pay

## 2019-05-06 ENCOUNTER — Telehealth: Payer: Self-pay | Admitting: Internal Medicine

## 2019-05-06 DIAGNOSIS — D496 Neoplasm of unspecified behavior of brain: Secondary | ICD-10-CM | POA: Diagnosis not present

## 2019-05-06 DIAGNOSIS — I10 Essential (primary) hypertension: Secondary | ICD-10-CM | POA: Diagnosis not present

## 2019-05-06 DIAGNOSIS — G939 Disorder of brain, unspecified: Secondary | ICD-10-CM | POA: Diagnosis not present

## 2019-05-06 DIAGNOSIS — R531 Weakness: Secondary | ICD-10-CM | POA: Diagnosis not present

## 2019-05-06 DIAGNOSIS — G9389 Other specified disorders of brain: Secondary | ICD-10-CM | POA: Diagnosis not present

## 2019-05-06 LAB — CBC WITH DIFFERENTIAL/PLATELET
Abs Immature Granulocytes: 0.03 10*3/uL (ref 0.00–0.07)
Basophils Absolute: 0 10*3/uL (ref 0.0–0.1)
Basophils Relative: 0 %
Eosinophils Absolute: 0 10*3/uL (ref 0.0–0.5)
Eosinophils Relative: 0 %
HCT: 43.6 % (ref 39.0–52.0)
Hemoglobin: 15.6 g/dL (ref 13.0–17.0)
Immature Granulocytes: 1 %
Lymphocytes Relative: 17 %
Lymphs Abs: 0.9 10*3/uL (ref 0.7–4.0)
MCH: 34.4 pg — ABNORMAL HIGH (ref 26.0–34.0)
MCHC: 35.8 g/dL (ref 30.0–36.0)
MCV: 96.2 fL (ref 80.0–100.0)
Monocytes Absolute: 0.1 10*3/uL (ref 0.1–1.0)
Monocytes Relative: 2 %
Neutro Abs: 4.2 10*3/uL (ref 1.7–7.7)
Neutrophils Relative %: 80 %
Platelets: 129 10*3/uL — ABNORMAL LOW (ref 150–400)
RBC: 4.53 MIL/uL (ref 4.22–5.81)
RDW: 11.7 % (ref 11.5–15.5)
WBC: 5.2 10*3/uL (ref 4.0–10.5)
nRBC: 0 % (ref 0.0–0.2)

## 2019-05-06 LAB — URINALYSIS, ROUTINE W REFLEX MICROSCOPIC
Bilirubin Urine: NEGATIVE
Glucose, UA: NEGATIVE mg/dL
Hgb urine dipstick: NEGATIVE
Ketones, ur: 5 mg/dL — AB
Leukocytes,Ua: NEGATIVE
Nitrite: NEGATIVE
Protein, ur: NEGATIVE mg/dL
Specific Gravity, Urine: 1.006 (ref 1.005–1.030)
pH: 6 (ref 5.0–8.0)

## 2019-05-06 LAB — COMPREHENSIVE METABOLIC PANEL
ALT: 20 U/L (ref 0–44)
AST: 21 U/L (ref 15–41)
Albumin: 3.9 g/dL (ref 3.5–5.0)
Alkaline Phosphatase: 50 U/L (ref 38–126)
Anion gap: 8 (ref 5–15)
BUN: 16 mg/dL (ref 8–23)
CO2: 23 mmol/L (ref 22–32)
Calcium: 8.7 mg/dL — ABNORMAL LOW (ref 8.9–10.3)
Chloride: 107 mmol/L (ref 98–111)
Creatinine, Ser: 1.18 mg/dL (ref 0.61–1.24)
GFR calc Af Amer: >60 mL/min (ref >60)
GFR calc non Af Amer: 58 mL/min — ABNORMAL LOW (ref >60)
Glucose, Bld: 122 mg/dL — ABNORMAL HIGH (ref 70–99)
Potassium: 3.9 mmol/L (ref 3.5–5.1)
Sodium: 138 mmol/L (ref 135–145)
Total Bilirubin: 1.7 mg/dL — ABNORMAL HIGH (ref 0.3–1.2)
Total Protein: 6.4 g/dL — ABNORMAL LOW (ref 6.5–8.1)

## 2019-05-06 LAB — GLUCOSE, CAPILLARY: Glucose-Capillary: 137 mg/dL — ABNORMAL HIGH (ref 70–99)

## 2019-05-06 LAB — SARS CORONAVIRUS 2 BY RT PCR (HOSPITAL ORDER, PERFORMED IN ~~LOC~~ HOSPITAL LAB): SARS Coronavirus 2: NEGATIVE

## 2019-05-06 MED ORDER — POLYETHYLENE GLYCOL 3350 17 G PO PACK: 17.0000 g | PACK | Freq: Every day | ORAL | Status: DC | PRN

## 2019-05-06 MED ORDER — SODIUM CHLORIDE 0.9% FLUSH
3.0000 mL | Freq: Two times a day (BID) | INTRAVENOUS | Status: DC
  Administered 2019-05-06: 01:00:00 3 mL via INTRAVENOUS

## 2019-05-06 MED ORDER — ONDANSETRON HCL 4 MG/2ML IJ SOLN: 4.0000 mg | Freq: Four times a day (QID) | INTRAMUSCULAR | Status: DC | PRN

## 2019-05-06 MED ORDER — HYDROCODONE-ACETAMINOPHEN 5-325 MG PO TABS: 1.0000 | ORAL_TABLET | ORAL | Status: DC | PRN

## 2019-05-06 MED ORDER — CALCIUM CARBONATE ANTACID 500 MG PO CHEW: 1.0000 | CHEWABLE_TABLET | Freq: Every evening | ORAL | Status: DC | PRN

## 2019-05-06 MED ORDER — LACOSAMIDE 50 MG PO TABS
50.0000 mg | ORAL_TABLET | Freq: Two times a day (BID) | ORAL | Status: DC
  Administered 2019-05-06: 50 mg via ORAL
  Filled 2019-05-06: qty 1

## 2019-05-06 MED ORDER — DEXAMETHASONE 4 MG PO TABS: 4.0000 mg | ORAL_TABLET | Freq: Every day | ORAL | 1 refills | Status: AC

## 2019-05-06 MED ORDER — ACETAMINOPHEN 650 MG RE SUPP: 650.0000 mg | Freq: Four times a day (QID) | RECTAL | Status: DC | PRN

## 2019-05-06 MED ORDER — LISINOPRIL 20 MG PO TABS
20.0000 mg | ORAL_TABLET | Freq: Every day | ORAL | Status: DC
  Administered 2019-05-06: 20 mg via ORAL
  Filled 2019-05-06: qty 1

## 2019-05-06 MED ORDER — ONDANSETRON HCL 4 MG PO TABS: 4.0000 mg | ORAL_TABLET | Freq: Four times a day (QID) | ORAL | Status: DC | PRN

## 2019-05-06 MED ORDER — ACETAMINOPHEN 325 MG PO TABS: 650.0000 mg | ORAL_TABLET | Freq: Four times a day (QID) | ORAL | Status: DC | PRN

## 2019-05-06 MED ORDER — SODIUM CHLORIDE 0.9 % IV SOLN
INTRAVENOUS | Status: AC
  Administered 2019-05-06: 01:00:00 via INTRAVENOUS

## 2019-05-06 NOTE — ED Notes (Signed)
Gave report to Ubaldo Glassing, Therapist, sports for room [phone].

## 2019-05-06 NOTE — Consult Note (Signed)
[name] Consult Note  Patient Care Team: Colon Branch, MD as PCP - General (Internal Medicine) Druscilla Brownie, MD as Consulting Physician (Dermatology) Franchot Gallo, MD as Consulting Physician (Urology)  CHIEF COMPLAINTS/PURPOSE OF CONSULTATION:  Brain Tumor  HISTORY OF PRESENTING ILLNESS:  Brent Coleman 78 y.o. male presented yesterday with clinical decline, described as "several days of weakening of right leg and right arm".  He had visited [place] in clinic last week, mentioned some issues balancing on one leg while getting pants on, but nothing alarming at that time.  MRI scheduled for later in august had been pushed up.  Since that time, he has noticed more concrete and disturbing loss of right sided function.  MRI performed yesterday demonstrated considerable progression of disease.  At this time, he feels "ok", not sure if steroids have helped much.  MEDICAL HISTORY:  Past Medical History:  Diagnosis Date  . Basal cell carcinoma    sees derm q year  . 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 Gallstone   . GERD (gastroesophageal reflux disease)   . Hyperlipidemia   . Hypertension   . Malignant neoplasm of prostate ([place]) ~ 2007   sees urology q year    SURGICAL HISTORY: Past Surgical History:  Procedure Laterality Date  . POLYPECTOMY    . TONSILLECTOMY  1949    SOCIAL HISTORY: Social History   Socioeconomic History  . Marital status: Married    Spouse name: Not on file  . Number of children: 2  . Years of education: Not on file  . Highest education level: Not on file  Occupational History  . Occupation: retired--  Scientific laboratory technician  . Financial resource strain: Not on file  . Food insecurity    Worry: Not on file    Inability: Not on file  . Transportation needs    Medical: Not on file    Non-medical: Not on file  Tobacco Use  . Smoking status: Never Smoker  .  Smokeless tobacco: Never Used  Substance and Sexual Activity  . Alcohol use: Yes  . Drug use: No  . Sexual activity: Yes    Partners: Female  Lifestyle  . Physical activity    Days per week: Not on file    Minutes per session: Not on file  . Stress: Not on file  Relationships  . Social Herbalist on phone: Not on file    Gets together: Not on file    Attends religious service: Not on file    Active member of club or organization: Not on file    Attends meetings of clubs or organizations: Not on file    Relationship status: Not on file  . Intimate partner violence    Fear of current or ex partner: Not on file    Emotionally abused: Not on file    Physically abused: Not on file    Forced sexual activity: Not on file  Other Topics Concern  . Not on file  Social History Narrative   HS[employer]. College grad.    Married/ marriage is in good health. 2 Daughters. 2 grandchildren living in [place].    Work - retired from [employer] @ 50 and has enjoyed his retirement.          FAMILY HISTORY: Family History  Problem Relation Age of Onset  . Colon cancer Mother 19  . Colon cancer Father 56  colon  . Hypertension Other   . Stroke Other   . CAD Neg Hx   . Prostate cancer Neg Hx   . Diabetes Neg Hx     ALLERGIES:  is allergic to doxazosin mesylate.  MEDICATIONS:  Current Facility-Administered Medications  Medication Dose Route Frequency Provider Last Rate Last Dose  . acetaminophen (TYLENOL) tablet 650 mg  650 mg Oral Q6H PRN Opyd, Ilene Qua, MD       Or  . acetaminophen (TYLENOL) suppository 650 mg  650 mg Rectal Q6H PRN Opyd, Ilene Qua, MD      . calcium carbonate (TUMS - dosed in mg elemental calcium) chewable tablet 200 mg of elemental calcium  1 tablet Oral QHS PRN Opyd, Ilene Qua, MD      . dexamethasone (DECADRON) injection 6 mg  6 mg Intravenous Q6H Opyd, Ilene Qua, MD   6 mg at 05/06/19 0612  . HYDROcodone-acetaminophen (NORCO/VICODIN) 5-325 MG per tablet 1  tablet  1 tablet Oral Q4H PRN Opyd, Ilene Qua, MD      . lacosamide (VIMPAT) tablet 50 mg  50 mg Oral BID Opyd, Ilene Qua, MD   50 mg at 05/06/19 1043  . lisinopril (ZESTRIL) tablet 20 mg  20 mg Oral Daily Opyd, Ilene Qua, MD   20 mg at 05/06/19 1043  . ondansetron (ZOFRAN) tablet 4 mg  4 mg Oral Q6H PRN Opyd, Ilene Qua, MD       Or  . ondansetron (ZOFRAN) injection 4 mg  4 mg Intravenous Q6H PRN Opyd, Ilene Qua, MD      . polyethylene glycol (MIRALAX / GLYCOLAX) packet 17 g  17 g Oral Daily PRN Opyd, Ilene Qua, MD      . sodium chloride flush (NS) 0.9 % injection 3 mL  3 mL Intravenous Q12H Opyd, Ilene Qua, MD   3 mL at 05/06/19 0100    REVIEW OF SYSTEMS:   Constitutional: Denies fevers, chills or abnormal weight loss Eyes: Denies blurriness of vision Ears, nose, mouth, throat, and face: Denies mucositis or sore throat Respiratory: Denies cough, dyspnea or wheezes Cardiovascular: Denies palpitation, chest discomfort or lower extremity swelling Gastrointestinal:  Denies nausea, constipation, diarrhea GU: Denies dysuria or incontinence Skin: Denies abnormal skin rashes Neurological: Per HPI Musculoskeletal: Denies joint pain, back or neck discomfort. No decrease in ROM Behavioral/Psych: Denies anxiety, disturbance in thought content, and mood instability   PHYSICAL EXAMINATION: Vitals:   05/06/19 0056 05/06/19 0444  BP: (!) 144/86 (!) 146/81  Pulse: 68 62  Resp: 18 18  Temp: 98.5 F (36.9 C) 97.7 F (36.5 C)  SpO2: 98% 95%   KPS: 70. General: Alert, cooperative, pleasant, in no acute distress Head: Normal EENT: No conjunctival injection or scleral icterus. Oral mucosa moist Lungs: Resp effort normal Cardiac: Regular rate and rhythm Abdomen: Soft, non-distended abdomen Skin: No rashes cyanosis or petechiae. Extremities: No clubbing or edema  NEUROLOGIC EXAM: Mental Status: Awake, alert, attentive to examiner. Oriented to self and environment. Language is fluent with intact  comprehension.  Cranial Nerves: Visual acuity is grossly normal. Visual fields are full. Extra-ocular movements intact. No ptosis. Face is symmetric, tongue midline. Motor: Tone and bulk are normal. 4+/5 in right arm and leg. Reflexes are symmetric, no pathologic reflexes present. Intact finger to nose bilaterally Sensory: Intact to light touch and temperature Gait: Deferred   LABORATORY DATA:  I have reviewed the data as listed Lab Results  Component Value Date   WBC 5.2 05/06/2019  HGB 15.6 05/06/2019   HCT 43.6 05/06/2019   MCV 96.2 05/06/2019   PLT 129 (L) 05/06/2019   Recent Labs    03/19/19 1505  03/20/19 0528 05/05/19 2019 05/06/19 0359  NA 137   < > 138 134* 138  K 3.4*   < > 3.9 3.6 3.9  CL 106   < > 109 103 107  CO2 14*  --  22 21* 23  GLUCOSE 154*   < > 109* 105* 122*  BUN 23   < > 18 19 16   CREATININE 1.69*   < > 1.39* 1.40* 1.18  CALCIUM 8.7*  --  8.6* 8.6* 8.7*  GFRNONAA 38*  --  48* 47* 58*  GFRAA 43*  --  55* 55* >60  PROT 5.6*  --   --  6.7 6.4*  ALBUMIN 3.5  --   --  4.3 3.9  AST 26  --   --  24 21  ALT 19  --   --  21 20  ALKPHOS 49  --   --  53 50  BILITOT 2.0*  --   --  1.5* 1.7*   < > = values in this interval not displayed.    RADIOGRAPHIC STUDIES: I have personally reviewed the radiological images as listed and agreed with the findings in the report. Mr Jeri Cos And Wo Contrast  Result Date: 05/05/2019 CLINICAL DATA:  Brain tumor.  Worsening right-sided weakness EXAM: MRI HEAD WITHOUT AND WITH CONTRAST TECHNIQUE: Multiplanar, multiecho pulse sequences of the brain and surrounding structures were obtained without and with intravenous contrast. CONTRAST:  10 mL Gadovist IV COMPARISON:  MRI head 03/19/2019 FINDINGS: Brain: Rapidly enlarging mass in the left posterior frontal lobe. Marked progression of enhancement of the mass. The mass now shows extensive peripheral enhancement with central necrosis and measures 44 x 32 x 39 mm. Previously, there was  only a small area of enhancement over the convexity which appeared to be on the surface of the brain. Edema surrounding the enhancing component of the mass has progressed. Local mass-effect on the left lateral ventricle without significant midline shift. The lesion does not show restricted diffusion. Interval development of small amount of hemorrhage within the lesion. No acute infarct. No other lesions identified. Vascular: Normal arterial flow voids Skull and upper cervical spine: Negative Sinuses/Orbits: Mild mucosal edema paranasal sinuses.  Normal orbit Other: None IMPRESSION: Rapid interval growth of left posterior frontal mass lesion. The lesion now shows extensive enhancement with central necrosis and a small amount of internal hemorrhage. Increased surrounding mass-effect and edema. 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 Rapidly growing oligometastatic disease considered less likely. Electronically Signed   By: Franchot Gallo M.D.   On: 05/05/2019 21:52    ASSESSMENT & PLAN:  Brain Tumor  I had an extensive discussion with Mr. Gandolfi regarding changes on his scan, potential implications, and recommended pathways forward.  Imaging findings are likely representative of glioblastoma, either "tranfsormed" from what appeared to be lower grade glioma in June, or suggestive of very early tumorigenesis at that time.   Regarding surgery, he is unsure of his preference at this time.  Dr. Ellene Route has offered debulking resection or biopsy.  The patient expressed openness regarding a second opinion if complete resection was achievable.    For disposition, I think it would be ok for him to be discharged to home this weekend, if able to ambulate safely.  My preference would be discharge with 4mg  daily dexamethasone and  continue other home medications.  He could be scheduled to return for surgery as an outpatient.  In the meantime, given patients requests I will share MRI with Dr. Derrill Memo at  [facility] for a remote consultation and potential in-person evaluation.   Will continue to follow.  Please text/call [phone] with any updates or questions regarding this case.   All questions were answered. The patient knows to call the clinic with any problems, questions or concerns.  The total time spent in the encounter was 55 minutes and more than 50% was on counseling and review of test results     Ventura Sellers, MD 05/06/2019 11:24 AM

## 2019-05-06 NOTE — Consult Note (Signed)
Reason for Consult: Malignant brain tumor Referring Physician: Dr. Heide Guile is an 78 y.o. male.  HPI: Patient is an 78 year old right-handed individual who 6 weeks ago and apparent fainting episode.  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  He was started on some antiseizure medicines and has been followed clinically.  A few days ago he noted that he had the onset of increasing weakness in the right arm and then in the right leg he noted that getting into and out of a car became increasingly difficult without lifting the right leg manually and his right arm developed some difficulties with fine motor movements such as tying shoelaces or using a keyboard.  Because the symptoms had progressed in the last day he was seen in the emergency room where an MRI was repeated.  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  I was consulted regarding this patient.  Past Medical History:  Diagnosis Date  . Basal cell carcinoma    sees derm q year  . 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 Gallstone   . GERD (gastroesophageal reflux disease)   . Hyperlipidemia   . Hypertension   . Malignant neoplasm of prostate [facility]) ~ 2007   sees urology q year    Past Surgical History:  Procedure Laterality Date  . POLYPECTOMY    . TONSILLECTOMY  1949    Family History  Problem Relation Age of Onset  . Colon cancer Mother 74  . Colon cancer Father 60       colon  . Hypertension Other   . Stroke Other   . CAD Neg Hx   . Prostate cancer Neg Hx   . Diabetes Neg Hx     Social History:  reports that he has never smoked. He has never used smokeless tobacco.  He reports current alcohol use. He reports that he does not use drugs.  Allergies:  Allergies  Allergen Reactions  . Doxazosin Mesylate Other (See Comments)    REACTION: Abdominal pain    Medications: I have reviewed the patient's current medications.  Results for orders placed or performed during the hospital encounter of 05/05/19 (from the past 48 hour(s))  Urinalysis, Routine w reflex microscopic     Status: Abnormal   Collection Time: 05/05/19  8:10 PM  Result Value Ref Range   Color, Urine STRAW (A) YELLOW   APPearance CLEAR CLEAR   Specific Gravity, Urine 1.006 1.005 - 1.030   pH 6.0 5.0 - 8.0   Glucose, UA NEGATIVE NEGATIVE mg/dL   Hgb urine dipstick NEGATIVE NEGATIVE   Bilirubin Urine NEGATIVE NEGATIVE   Ketones, ur 5 (A) NEGATIVE mg/dL   Protein, ur NEGATIVE NEGATIVE mg/dL   Nitrite NEGATIVE NEGATIVE   Leukocytes,Ua NEGATIVE NEGATIVE    Comment: Performed at [facility] [address] [place], [place] [address]  CBC with Differential     Status: Abnormal   Collection Time: 05/05/19  8:19 PM  Result Value Ref Range   WBC 5.1 4.0 - 10.5 K/uL   RBC 4.57 4.22 - 5.81 MIL/uL   Hemoglobin 15.8 13.0 - 17.0 g/dL   HCT 43.4 39.0 - 52.0 %  MCV 95.0 80.0 - 100.0 fL   MCH 34.6 (H) 26.0 - 34.0 pg   MCHC 36.4 (H) 30.0 - 36.0 g/dL   RDW 11.8 11.5 - 15.5 %   Platelets 137 (L) 150 - 400 K/uL   nRBC 0.0 0.0 - 0.2 %   Neutrophils Relative % 65 %   Neutro Abs 3.3 1.7 - 7.7 K/uL   Lymphocytes Relative 23 %   Lymphs Abs 1.2 0.7 - 4.0 K/uL   Monocytes Relative 10 %   Monocytes Absolute 0.5 0.1 - 1.0 K/uL   Eosinophils Relative 2 %   Eosinophils Absolute 0.1 0.0 - 0.5 K/uL   Basophils Relative 0 %   Basophils Absolute 0.0 0.0 - 0.1 K/uL   Immature Granulocytes 0 %   Abs Immature Granulocytes 0.02 0.00 - 0.07 K/uL    Comment: Performed at [facility], [place] [address], [place], [place] [address]  Comprehensive metabolic panel     Status:  Abnormal   Collection Time: 05/05/19  8:19 PM  Result Value Ref Range   Sodium 134 (L) 135 - 145 mmol/L   Potassium 3.6 3.5 - 5.1 mmol/L   Chloride 103 98 - 111 mmol/L   CO2 21 (L) 22 - 32 mmol/L   Glucose, Bld 105 (H) 70 - 99 mg/dL   BUN 19 8 - 23 mg/dL   Creatinine, Ser 1.40 (H) 0.61 - 1.24 mg/dL   Calcium 8.6 (L) 8.9 - 10.3 mg/dL   Total Protein 6.7 6.5 - 8.1 g/dL   Albumin 4.3 3.5 - 5.0 g/dL   AST 24 15 - 41 U/L   ALT 21 0 - 44 U/L   Alkaline Phosphatase 53 38 - 126 U/L   Total Bilirubin 1.5 (H) 0.3 - 1.2 mg/dL   GFR calc non Af Amer 47 (L) >60 mL/min   GFR calc Af Amer 55 (L) >60 mL/min   Anion gap 10 5 - 15    Comment: Performed at [facility], [place] [address], [place], [place] [address]  APTT     Status: None   Collection Time: 05/05/19  8:19 PM  Result Value Ref Range   aPTT 29 24 - 36 seconds    Comment: Performed at [facility], [place] [address] [place], [place] [address]  Protime-INR     Status: None   Collection Time: 05/05/19  8:19 PM  Result Value Ref Range   Prothrombin Time 13.6 11.4 - 15.2 seconds   INR 1.1 0.8 - 1.2    Comment: (NOTE) INR goal varies based on device and disease states. Performed at [facility], [place] [address], [place], [place] [address]   SARS Coronavirus 2 (CEPHEID - Performed in [facility] hospital lab), Hosp Order     Status: None   Collection Time: 05/05/19 10:37 PM   Specimen: Nasopharyngeal Swab  Result Value Ref Range   SARS Coronavirus 2 NEGATIVE NEGATIVE    Comment: (NOTE) If result is NEGATIVE SARS-CoV-2 target nucleic acids are NOT DETECTED. The SARS-CoV-2 RNA is generally detectable in upper and lower  respiratory specimens during the acute phase of infection. The lowest  concentration of SARS-CoV-2 viral copies this assay can detect is 250  copies / mL. A negative result does not preclude SARS-CoV-2 infection  and should not be used as the sole basis for treatment  or other  patient management decisions.  A negative result may occur with  improper specimen collection / handling, submission  of specimen other  than nasopharyngeal swab, presence of viral mutation(s) within the  areas targeted by this assay, and inadequate number of viral copies  (<250 copies / mL). A negative result must be combined with clinical  observations, patient history, and epidemiological information. If result is POSITIVE SARS-CoV-2 target nucleic acids are DETECTED. The SARS-CoV-2 RNA is generally detectable in upper and lower  respiratory specimens dur ing the acute phase of infection.  Positive  results are indicative of active infection with SARS-CoV-2.  Clinical  correlation with patient history and other diagnostic information is  necessary to determine patient infection status.  Positive results do  not rule out bacterial infection or co-infection with other viruses. If result is PRESUMPTIVE POSTIVE SARS-CoV-2 nucleic acids MAY BE PRESENT.   A presumptive positive result was obtained on the submitted specimen  and confirmed on repeat testing.  While 2019 novel coronavirus  (SARS-CoV-2) nucleic acids may be present in the submitted sample  additional confirmatory testing may be necessary for epidemiological  and / or clinical management purposes  to differentiate between  SARS-CoV-2 and other Sarbecovirus currently known to infect humans.  If clinically indicated additional testing with an alternate test  methodology [phone]) is advised. The SARS-CoV-2 RNA is generally  detectable in upper and lower respiratory sp ecimens during the acute  phase of infection. The expected result is Negative. Fact Sheet for Patients:  StrictlyIdeas.no Fact Sheet for Healthcare Providers: BankingDealers.co.za This test is not yet approved or cleared by the [place] FDA and has been authorized for detection and/or diagnosis of  SARS-CoV-2 by FDA under an Emergency Use Authorization (EUA).  This EUA will remain in effect (meaning this test can be used) for the duration of the COVID-19 declaration under Section 564(b)(1) of the Act, 21 U.S.C. section 360bbb-3(b)(1), unless the authorization is terminated or revoked sooner. Performed at [facility], [place] [address] [place], [place] [address]   Comprehensive metabolic panel     Status: Abnormal   Collection Time: 05/06/19  3:59 AM  Result Value Ref Range   Sodium 138 135 - 145 mmol/L   Potassium 3.9 3.5 - 5.1 mmol/L   Chloride 107 98 - 111 mmol/L   CO2 23 22 - 32 mmol/L   Glucose, Bld 122 (H) 70 - 99 mg/dL   BUN 16 8 - 23 mg/dL   Creatinine, Ser 1.18 0.61 - 1.24 mg/dL   Calcium 8.7 (L) 8.9 - 10.3 mg/dL   Total Protein 6.4 (L) 6.5 - 8.1 g/dL   Albumin 3.9 3.5 - 5.0 g/dL   AST 21 15 - 41 U/L   ALT 20 0 - 44 U/L   Alkaline Phosphatase 50 38 - 126 U/L   Total Bilirubin 1.7 (H) 0.3 - 1.2 mg/dL   GFR calc non Af Amer 58 (L) >60 mL/min   GFR calc Af Amer >60 >60 mL/min   Anion gap 8 5 - 15    Comment: Performed at [facility], [place] [address], [place], [place] [address]  CBC WITH DIFFERENTIAL     Status: Abnormal   Collection Time: 05/06/19  3:59 AM  Result Value Ref Range   WBC 5.2 4.0 - 10.5 K/uL   RBC 4.53 4.22 - 5.81 MIL/uL   Hemoglobin 15.6 13.0 - 17.0 g/dL   HCT 43.6 39.0 - 52.0 %   MCV 96.2 80.0 - 100.0 fL   MCH 34.4 (H) 26.0 - 34.0 pg   MCHC 35.8 30.0 - 36.0  g/dL   RDW 11.7 11.5 - 15.5 %   Platelets 129 (L) 150 - 400 K/uL   nRBC 0.0 0.0 - 0.2 %   Neutrophils Relative % 80 %   Neutro Abs 4.2 1.7 - 7.7 K/uL   Lymphocytes Relative 17 %   Lymphs Abs 0.9 0.7 - 4.0 K/uL   Monocytes Relative 2 %   Monocytes Absolute 0.1 0.1 - 1.0 K/uL   Eosinophils Relative 0 %   Eosinophils Absolute 0.0 0.0 - 0.5 K/uL   Basophils Relative 0 %   Basophils Absolute 0.0 0.0 - 0.1 K/uL   Immature Granulocytes 1 %   Abs Immature  Granulocytes 0.03 0.00 - 0.07 K/uL    Comment: Performed at [facility], [place] [address] [place], [place] [address]  Glucose, capillary     Status: Abnormal   Collection Time: 05/06/19  8:25 AM  Result Value Ref Range   Glucose-Capillary 137 (H) 70 - 99 mg/dL    Mr Jeri Cos And Wo Contrast  Result Date: 05/05/2019 CLINICAL DATA:  Brain tumor.  Worsening right-sided weakness EXAM: MRI HEAD WITHOUT AND WITH CONTRAST TECHNIQUE: Multiplanar, multiecho pulse sequences of the brain and surrounding structures were obtained without and with intravenous contrast. CONTRAST:  10 mL Gadovist IV COMPARISON:  MRI head 03/19/2019 FINDINGS: Brain: Rapidly enlarging mass in the left posterior frontal lobe. Marked progression of enhancement of the mass. The mass now shows extensive peripheral enhancement with central necrosis and measures 44 x 32 x 39 mm. Previously, there was only a small area of enhancement over the convexity which appeared to be on the surface of the brain. Edema surrounding the enhancing component of the mass has progressed. Local mass-effect on the left lateral ventricle without significant midline shift. The lesion does not show restricted diffusion. Interval development of small amount of hemorrhage within the lesion. No acute infarct. No other lesions identified. Vascular: Normal arterial flow voids Skull and upper cervical spine: Negative Sinuses/Orbits: Mild mucosal edema paranasal sinuses.  Normal orbit Other: None IMPRESSION: Rapid interval growth of left posterior frontal mass lesion. The lesion now shows extensive enhancement with central necrosis and a small amount of internal hemorrhage. Increased surrounding mass-effect and edema. 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 Rapidly growing oligometastatic disease considered less likely. Electronically Signed   By: Franchot Gallo M.D.   On: 05/05/2019 21:52    Review of Systems  Constitutional: Negative.    HENT: Negative.   Eyes: Negative.   Respiratory: Negative.   Cardiovascular: Negative.   Gastrointestinal: Negative.   Genitourinary: Negative.   Musculoskeletal: Negative.   Skin: Negative.   Neurological:       Patient has noted onset of weakness in the last 2 days in the right arm and the right leg.  This has become progressively worse  Endo/Heme/Allergies: Negative.   Psychiatric/Behavioral: Negative.    Blood pressure (!) 146/81, pulse 62, temperature 97.7 F (36.5 C), temperature source Oral, resp. rate 18, SpO2 95 %. Physical Exam  Constitutional: He is oriented to person, place, and time. He appears well-developed and well-nourished.  Neck: Normal range of motion. Neck supple.  Neurological: He is alert and oriented to person, place, and time.  Speech is intact in both content and understanding.  Patient has a slight right facial weakness that is central in origin.  The patient has a marked right upper extremity weakness with increased tone and rigidity in the right arm however he is able to manipulate the  fingers.  Right leg is weaker than the right arm with some difficulty lifting it off the bed he is able to flex at the hip.  The patient was not ambulated on today's exam.  The rest of the cranial nerve examination is within the limits of normal.    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  I have discussed the situation with the patient noting that aggressive treatment may yield relief and hopefully some improvement in symptoms for 18 to possibly even 24 months however much depends on the behavior of the tumor after surgical resection and initial trials of aggressive treatment.  Certainly the marked change that has occurred in the last 6 weeks suggest that this is a very highly aggressive tumor.  I noted that with minimal or no treatment the likelihood of survival beyond 6 months is very slim.  In light of  the patient's recent deterioration Decadron has been started this may have made a slight improvement for him overnight.  I discussed having Dr. Mickeal Skinner visit with him to discuss his situation further and if we wanted to pursue aggressive treatment would likely plan surgery for the beginning of the week.  If all is well he can be discharged later today but he can be given a diet today.  Blanchie Dessert Arieh Bogue 05/06/2019, 8:38 AM

## 2019-05-06 NOTE — Care Management Obs Status (Signed)
[place] NOTIFICATION   Patient Details  Name: Brent Coleman MRN: [id number] Date of Birth: 1941/03/17   Medicare Observation Status Notification Given:  Yes    Purcell Mouton, RN 05/06/2019, 1:10 PM

## 2019-05-06 NOTE — Discharge Summary (Signed)
Physician Discharge Summary  Brent Coleman WJX:[id number] DOB: 1940/10/09 DOA: 05/05/2019  PCP: Colon Branch, MD  Admit date: 05/05/2019 Discharge date: 05/06/2019  Admitted From: Home Disposition:  Home  Recommendations for Outpatient Follow-up:  1. Follow up with PCP in 1-2 weeks 2. Follow up with Neurosurgery as scheduled  Discharge Condition:Stable CODE STATUS:Full Diet recommendation: Regular   Brief/Interim Summary: 78 y.o. male with medical history significant for hypertension, GERD, prostate cancer status post radiation therapy remotely, chronic kidney disease stage III, and brain mass identified in June 2020 during work-up of a for seizure, now presenting to the emergency department with worsening right-sided weakness.  Patient has been following with oncology since the recent identification of his brain mass, and was planned for repeat MRI for further characterization, but with right-sided deficits worsening fairly quickly, he now comes in for evaluation.  Patient reports that he was having some right upper and lower extremity weakness when he saw his oncologist on 05/02/2019, there was plan to move up the scheduled MRI to a closer date, but the right-sided deficits have worsened significantly in the past 3 days.  Patient describes dragging his right leg, unable to type on a keyboard or complete tasks with the right hand, but denies any significant headache and denies any difficulty swallowing or choking.  Patient has not noticed any fevers or chills, denies cough or shortness of breath, and does not know of any sick contacts.  ED Course: Upon arrival to the ED, patient is found to be afebrile, saturating well on room air, and with stable blood pressure.  Chemistry panel is notable for a sodium of 134, bilirubin 1.5, and creatinine 1.40.  CBC features a slight thrombocytopenia.  MRI brain reveals rapid interval growth of left posterior frontal mass lesion, now with extensive  enhancement, central necrosis, and a small amount of internal hemorrhage, as well as increased surrounding mass-effect and edema, with findings most concerning for glioblastoma.  Neurosurgery was consulted by ED physician and it was recommended that the patient be started on Decadron and admitted to the medical service.  Discharge Diagnoses:  Principal Problem:   Brain mass Active Problems:   Essential hypertension   CKD (chronic kidney disease), stage III (HCC)   Seizure (HCC)   Thrombocytopenia (HCC)   Right sided weakness  1. Brain mass; right-sided weakness; hx of seizure   - Left posterior frontal brain mass was identified in June 2020 when he presented with a first seizure, he has been on antiepileptics with no further seizure, but now presents for evaluation of progressive right-sided weakness and is found on MRI to have rapid interval growth of the mass  - Neurosurgery was consulted by ED physician and recommended starting Decadron  - patient remained stable on Decadron, Vimpat - Pt was seen by Neurosurgery as well as Oncology regarding possibility of starting treatment and surgical intervention. Pt to continue 4mg  decadron daily and to follow up closely with Oncology and Neurosurgery as outpatient  2. CKD III  - SCr is 1.40 on admission, similar to priors  - Renally-dosed medications   3. Hypertension  - BP at goal, continued lisinopril    4. Thrombocytopenia  - This mild, chronic, and stable with small amount of hemorrhage within his brain mass but no active bleeding or evidence for infection    Discharge Instructions   Allergies as of 05/06/2019      Reactions   Doxazosin Mesylate Other (See Comments)   REACTION: Abdominal pain  Medication List    TAKE these medications   dexamethasone 4 MG tablet Commonly known as: DECADRON Take 1 tablet (4 mg total) by mouth daily.   lacosamide 50 MG Tabs tablet Commonly known as: Vimpat Take 1 tablet (50 mg total) by  mouth 2 (two) times daily.   lisinopril 20 MG tablet Commonly known as: ZESTRIL Take 1 tablet (20 mg total) by mouth daily.   multivitamin with minerals tablet Take 1 tablet by mouth daily.   TUMS PO Take 2 tablets by mouth at bedtime as needed (acid reflux).      Follow-up Information    Colon Branch, MD. Schedule an appointment as soon as possible for a visit in 1 week(s).   Specialty: Internal Medicine Contact information: [phone] [address] [facility] [address] [place] [address] [phone]          Allergies  Allergen Reactions  . Doxazosin Mesylate Other (See Comments)    REACTION: Abdominal pain    Consultations:  Oncology  Neurosurgery  Procedures/Studies: Mr Jeri Cos And Wo Contrast  Result Date: 05/05/2019 CLINICAL DATA:  Brain tumor.  Worsening right-sided weakness EXAM: MRI HEAD WITHOUT AND WITH CONTRAST TECHNIQUE: Multiplanar, multiecho pulse sequences of the brain and surrounding structures were obtained without and with intravenous contrast. CONTRAST:  10 mL Gadovist IV COMPARISON:  MRI head 03/19/2019 FINDINGS: Brain: Rapidly enlarging mass in the left posterior frontal lobe. Marked progression of enhancement of the mass. The mass now shows extensive peripheral enhancement with central necrosis and measures 44 x 32 x 39 mm. Previously, there was only a small area of enhancement over the convexity which appeared to be on the surface of the brain. Edema surrounding the enhancing component of the mass has progressed. Local mass-effect on the left lateral ventricle without significant midline shift. The lesion does not show restricted diffusion. Interval development of small amount of hemorrhage within the lesion. No acute infarct. No other lesions identified. Vascular: Normal arterial flow voids Skull and upper cervical spine: Negative Sinuses/Orbits: Mild mucosal edema paranasal sinuses.  Normal orbit Other: None IMPRESSION: Rapid interval growth of left  posterior frontal mass lesion. The lesion now shows extensive enhancement with central necrosis and a small amount of internal hemorrhage. Increased surrounding mass-effect and edema. 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 Rapidly growing oligometastatic disease considered less likely. Electronically Signed   By: Franchot Gallo M.D.   On: 05/05/2019 21:52    Subjective: Eager to go home  Discharge Exam: Vitals:   05/06/19 0056 05/06/19 0444  BP: (!) 144/86 (!) 146/81  Pulse: 68 62  Resp: 18 18  Temp: 98.5 F (36.9 C) 97.7 F (36.5 C)  SpO2: 98% 95%   Vitals:   05/05/19 2336 05/06/19 0056 05/06/19 0444 05/06/19 0918  BP: (!) 163/81 (!) 144/86 (!) 146/81   Pulse: 67 68 62   Resp: 15 18 18    Temp: 97.9 F (36.6 C) 98.5 F (36.9 C) 97.7 F (36.5 C)   TempSrc: Oral Oral Oral   SpO2: 97% 98% 95%   Weight:    87.9 kg  Height:    6' (1.829 m)    General: Pt is alert, awake, not in acute distress Cardiovascular: RRR, S1/S2 +, no rubs, no gallops Respiratory: CTA bilaterally, no wheezing, no rhonchi Abdominal: Soft, NT, ND, bowel sounds + Extremities: no edema, no cyanosis   The results of significant diagnostics from this hospitalization (including imaging, microbiology, ancillary and laboratory) are listed below for  reference.     Microbiology: Recent Results (from the past 240 hour(s))  SARS Coronavirus 2 (CEPHEID - Performed in [place] hospital lab), Hosp Order     Status: None   Collection Time: 05/05/19 10:37 PM   Specimen: Nasopharyngeal Swab  Result Value Ref Range Status   SARS Coronavirus 2 NEGATIVE NEGATIVE Final    Comment: (NOTE) If result is NEGATIVE SARS-CoV-2 target nucleic acids are NOT DETECTED. The SARS-CoV-2 RNA is generally detectable in upper and lower  respiratory specimens during the acute phase of infection. The lowest  concentration of SARS-CoV-2 viral copies this assay can detect is 250  copies / mL. A negative result does not  preclude SARS-CoV-2 infection  and should not be used as the sole basis for treatment or other  patient management decisions.  A negative result may occur with  improper specimen collection / handling, submission of specimen other  than nasopharyngeal swab, presence of viral mutation(s) within the  areas targeted by this assay, and inadequate number of viral copies  (<250 copies / mL). A negative result must be combined with clinical  observations, patient history, and epidemiological information. If result is POSITIVE SARS-CoV-2 target nucleic acids are DETECTED. The SARS-CoV-2 RNA is generally detectable in upper and lower  respiratory specimens dur ing the acute phase of infection.  Positive  results are indicative of active infection with SARS-CoV-2.  Clinical  correlation with patient history and other diagnostic information is  necessary to determine patient infection status.  Positive results do  not rule out bacterial infection or co-infection with other viruses. If result is PRESUMPTIVE POSTIVE SARS-CoV-2 nucleic acids MAY BE PRESENT.   A presumptive positive result was obtained on the submitted specimen  and confirmed on repeat testing.  While 2019 novel coronavirus  (SARS-CoV-2) nucleic acids may be present in the submitted sample  additional confirmatory testing may be necessary for epidemiological  and / or clinical management purposes  to differentiate between  SARS-CoV-2 and other Sarbecovirus currently known to infect humans.  If clinically indicated additional testing with an alternate test  methodology [phone]) is advised. The SARS-CoV-2 RNA is generally  detectable in upper and lower respiratory sp ecimens during the acute  phase of infection. The expected result is Negative. Fact Sheet for Patients:  StrictlyIdeas.no Fact Sheet for Healthcare Providers: BankingDealers.co.za This test is not yet approved or cleared by  the [place] FDA and has been authorized for detection and/or diagnosis of SARS-CoV-2 by FDA under an Emergency Use Authorization (EUA).  This EUA will remain in effect (meaning this test can be used) for the duration of the COVID-19 declaration under Section 564(b)(1) of the Act, 21 U.S.C. section 360bbb-3(b)(1), unless the authorization is terminated or revoked sooner. Performed at [facility], [place] [address] [place], [place] [address]      Labs: BNP (last 3 results) No results for input(s): BNP in the last 8760 hours. Basic Metabolic Panel: Recent Labs  Lab 05/05/19 2019 05/06/19 0359  NA 134* 138  K 3.6 3.9  CL 103 107  CO2 21* 23  GLUCOSE 105* 122*  BUN 19 16  CREATININE 1.40* 1.18  CALCIUM 8.6* 8.7*   Liver Function Tests: Recent Labs  Lab 05/05/19 2019 05/06/19 0359  AST 24 21  ALT 21 20  ALKPHOS 53 50  BILITOT 1.5* 1.7*  PROT 6.7 6.4*  ALBUMIN 4.3 3.9   No results for input(s): LIPASE, AMYLASE in the last 168 hours. No results for  input(s): AMMONIA in the last 168 hours. CBC: Recent Labs  Lab 05/05/19 2019 05/06/19 0359  WBC 5.1 5.2  NEUTROABS 3.3 4.2  HGB 15.8 15.6  HCT 43.4 43.6  MCV 95.0 96.2  PLT 137* 129*   Cardiac Enzymes: No results for input(s): CKTOTAL, CKMB, CKMBINDEX, TROPONINI in the last 168 hours. BNP: Invalid input(s): POCBNP CBG: Recent Labs  Lab 05/06/19 0825  GLUCAP 137*   D-Dimer No results for input(s): DDIMER in the last 72 hours. Hgb A1c No results for input(s): HGBA1C in the last 72 hours. Lipid Profile No results for input(s): CHOL, HDL, LDLCALC, TRIG, CHOLHDL, LDLDIRECT in the last 72 hours. Thyroid function studies No results for input(s): TSH, T4TOTAL, T3FREE, THYROIDAB in the last 72 hours.  Invalid input(s): FREET3 Anemia work up No results for input(s): VITAMINB12, FOLATE, FERRITIN, TIBC, IRON, RETICCTPCT in the last 72 hours. Urinalysis    Component Value Date/Time    COLORURINE STRAW (A) 05/05/2019 2010   APPEARANCEUR CLEAR 05/05/2019 2010   LABSPEC 1.006 05/05/2019 2010   PHURINE 6.0 05/05/2019 2010   GLUCOSEU NEGATIVE 05/05/2019 2010   GLUCOSEU NEGATIVE 01/08/2009 [place] NEGATIVE 05/05/2019 2010   BILIRUBINUR NEGATIVE 05/05/2019 2010   BILIRUBINUR Negative 02/02/2017 1123   KETONESUR 5 (A) 05/05/2019 2010   PROTEINUR NEGATIVE 05/05/2019 2010   UROBILINOGEN 0.2 02/02/2017 1123   UROBILINOGEN 0.2 11/09/2009 1355   NITRITE NEGATIVE 05/05/2019 2010   LEUKOCYTESUR NEGATIVE 05/05/2019 2010   Sepsis Labs Invalid input(s): PROCALCITONIN,  WBC,  LACTICIDVEN Microbiology Recent Results (from the past 240 hour(s))  SARS Coronavirus 2 (CEPHEID - Performed in [facility] hospital lab), Hosp Order     Status: None   Collection Time: 05/05/19 10:37 PM   Specimen: Nasopharyngeal Swab  Result Value Ref Range Status   SARS Coronavirus 2 NEGATIVE NEGATIVE Final    Comment: (NOTE) If result is NEGATIVE SARS-CoV-2 target nucleic acids are NOT DETECTED. The SARS-CoV-2 RNA is generally detectable in upper and lower  respiratory specimens during the acute phase of infection. The lowest  concentration of SARS-CoV-2 viral copies this assay can detect is 250  copies / mL. A negative result does not preclude SARS-CoV-2 infection  and should not be used as the sole basis for treatment or other  patient management decisions.  A negative result may occur with  improper specimen collection / handling, submission of specimen other  than nasopharyngeal swab, presence of viral mutation(s) within the  areas targeted by this assay, and inadequate number of viral copies  (<250 copies / mL). A negative result must be combined with clinical  observations, patient history, and epidemiological information. If result is POSITIVE SARS-CoV-2 target nucleic acids are DETECTED. The SARS-CoV-2 RNA is generally detectable in upper and lower  respiratory specimens dur ing the  acute phase of infection.  Positive  results are indicative of active infection with SARS-CoV-2.  Clinical  correlation with patient history and other diagnostic information is  necessary to determine patient infection status.  Positive results do  not rule out bacterial infection or co-infection with other viruses. If result is PRESUMPTIVE POSTIVE SARS-CoV-2 nucleic acids MAY BE PRESENT.   A presumptive positive result was obtained on the submitted specimen  and confirmed on repeat testing.  While 2019 novel coronavirus  (SARS-CoV-2) nucleic acids may be present in the submitted sample  additional confirmatory testing may be necessary for epidemiological  and / or clinical management purposes  to differentiate between  SARS-CoV-2 and other Sarbecovirus  currently known to infect humans.  If clinically indicated additional testing with an alternate test  methodology [phone]) is advised. The SARS-CoV-2 RNA is generally  detectable in upper and lower respiratory sp ecimens during the acute  phase of infection. The expected result is Negative. Fact Sheet for Patients:  StrictlyIdeas.no Fact Sheet for Healthcare Providers: BankingDealers.co.za This test is not yet approved or cleared by the [place] FDA and has been authorized for detection and/or diagnosis of SARS-CoV-2 by FDA under an Emergency Use Authorization (EUA).  This EUA will remain in effect (meaning this test can be used) for the duration of the COVID-19 declaration under Section 564(b)(1) of the Act, 21 U.S.C. section 360bbb-3(b)(1), unless the authorization is terminated or revoked sooner. Performed at [facility], [place] [address] [place], [place] [address]    Time spent: 30 min  SIGNED:   Marylu Lund, MD  [facility] 05/06/2019, 12:33 PM  If 7PM-7AM, please contact night-coverage

## 2019-05-06 NOTE — Telephone Encounter (Signed)
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 Dr. Ellene Route called me to update me, appreciate his call. Will check on the patient once his discharge.

## 2019-05-06 NOTE — Plan of Care (Signed)
  Problem: Clinical Measurements: Goal: Diagnostic test results will improve Outcome: Adequate for Discharge   Problem: Education: Goal: Knowledge of General Education information will improve Description: Including pain rating scale, medication(s)/side effects and non-pharmacologic comfort measures Outcome: Completed/Met   Problem: Health Behavior/Discharge Planning: Goal: Ability to manage health-related needs will improve Outcome: Completed/Met   Problem: Clinical Measurements: Goal: Respiratory complications will improve Outcome: Completed/Met   Problem: Activity: Goal: Risk for activity intolerance will decrease Outcome: Completed/Met   Problem: Coping: Goal: Level of anxiety will decrease Outcome: Completed/Met

## 2019-05-09 ENCOUNTER — Telehealth: Payer: Self-pay | Admitting: *Deleted

## 2019-05-09 NOTE — Telephone Encounter (Signed)
Transition Care Management Follow-up Telephone Call   Date discharged? 05/06/19   How have you been since you were released from the hospital? "I feel weak and tired and like someone just told me I have a brain tumor"   Do you understand why you were in the hospital? Yes, I have a brain tumor   Do you understand the discharge instructions? yes   Where were you discharged to? Home with wife   Items Reviewed:  Medications reviewed: no, pt states they made no changes  Allergies reviewed: yes  Dietary changes reviewed: yes  Referrals reviewed: yes. "I go to [facility] tomorrow"   Functional Questionnaire:   Activities of Daily Living (ADLs):   He states they are independent in the following: ambulation, bathing and hygiene, feeding, continence, grooming, toileting and dressing States they require assistance with the following: na   Any transportation issues/concerns?: no   Any patient concerns? no   Confirmed importance and date/time of follow-up visits scheduled yes  Provider Appointment booked with  PCP 05/11/19   Confirmed with patient if condition begins to worsen call PCP or go to the ER.  Patient was given the office number and encouraged to call back with question or concerns.  : yes

## 2019-05-10 ENCOUNTER — Ambulatory Visit: Admit: 2019-05-10 | Discharge: 2019-05-11 | Payer: MEDICARE

## 2019-05-10 DIAGNOSIS — Z1159 Encounter for screening for other viral diseases: Secondary | ICD-10-CM | POA: Diagnosis not present

## 2019-05-10 DIAGNOSIS — R791 Abnormal coagulation profile: Secondary | ICD-10-CM | POA: Diagnosis not present

## 2019-05-10 DIAGNOSIS — D496 Neoplasm of unspecified behavior of brain: Secondary | ICD-10-CM | POA: Diagnosis not present

## 2019-05-10 DIAGNOSIS — Z01818 Encounter for other preprocedural examination: Secondary | ICD-10-CM | POA: Diagnosis not present

## 2019-05-10 DIAGNOSIS — R399 Unspecified symptoms and signs involving the genitourinary system: Secondary | ICD-10-CM | POA: Diagnosis not present

## 2019-05-11 ENCOUNTER — Other Ambulatory Visit: Payer: Self-pay

## 2019-05-11 ENCOUNTER — Inpatient Hospital Stay: Payer: Medicare Other | Admitting: Internal Medicine

## 2019-05-11 DIAGNOSIS — R29810 Facial weakness: Secondary | ICD-10-CM | POA: Diagnosis not present

## 2019-05-11 DIAGNOSIS — R262 Difficulty in walking, not elsewhere classified: Secondary | ICD-10-CM | POA: Diagnosis not present

## 2019-05-11 DIAGNOSIS — R531 Weakness: Secondary | ICD-10-CM | POA: Diagnosis not present

## 2019-05-11 DIAGNOSIS — I10 Essential (primary) hypertension: Secondary | ICD-10-CM | POA: Diagnosis not present

## 2019-05-12 ENCOUNTER — Ambulatory Visit: Admit: 2019-05-12 | Discharge: 2019-05-13 | Payer: MEDICARE

## 2019-05-12 DIAGNOSIS — Z923 Personal history of irradiation: Secondary | ICD-10-CM | POA: Diagnosis not present

## 2019-05-12 DIAGNOSIS — R569 Unspecified convulsions: Secondary | ICD-10-CM | POA: Diagnosis present

## 2019-05-12 DIAGNOSIS — Z1159 Encounter for screening for other viral diseases: Secondary | ICD-10-CM | POA: Diagnosis not present

## 2019-05-12 DIAGNOSIS — Z8545 Personal history of malignant neoplasm of unspecified male genital organ: Secondary | ICD-10-CM | POA: Diagnosis not present

## 2019-05-12 DIAGNOSIS — D496 Neoplasm of unspecified behavior of brain: Secondary | ICD-10-CM | POA: Diagnosis not present

## 2019-05-12 DIAGNOSIS — C711 Malignant neoplasm of frontal lobe: Secondary | ICD-10-CM | POA: Diagnosis present

## 2019-05-12 DIAGNOSIS — I10 Essential (primary) hypertension: Secondary | ICD-10-CM | POA: Diagnosis present

## 2019-05-12 DIAGNOSIS — Z85828 Personal history of other malignant neoplasm of skin: Secondary | ICD-10-CM | POA: Diagnosis not present

## 2019-05-12 DIAGNOSIS — E785 Hyperlipidemia, unspecified: Secondary | ICD-10-CM | POA: Diagnosis present

## 2019-05-12 MED ORDER — SENNOSIDES-DOCUSATE SODIUM 8.6-50 MG PO TABS
2.00 | ORAL_TABLET | ORAL | Status: DC
Start: ? — End: 2019-05-12

## 2019-05-12 MED ORDER — DEXAMETHASONE 4 MG PO TABS
4.00 | ORAL_TABLET | ORAL | Status: DC
Start: ? — End: 2019-05-12

## 2019-05-12 MED ORDER — GLUCAGON HCL RDNA (DIAGNOSTIC) 1 MG IJ SOLR
1.00 | INTRAMUSCULAR | Status: DC
Start: ? — End: 2019-05-12

## 2019-05-12 MED ORDER — DEXTROSE 50 % IV SOLN
12.50 | INTRAVENOUS | Status: DC
Start: ? — End: 2019-05-12

## 2019-05-12 MED ORDER — LISINOPRIL 20 MG PO TABS
20.00 | ORAL_TABLET | ORAL | Status: DC
Start: 2019-05-13 — End: 2019-05-12

## 2019-05-12 MED ORDER — ONDANSETRON HCL 4 MG/2ML IJ SOLN
4.00 | INTRAMUSCULAR | Status: DC
Start: ? — End: 2019-05-12

## 2019-05-12 MED ORDER — LACOSAMIDE 50 MG PO TABS
50.00 | ORAL_TABLET | ORAL | Status: DC
Start: 2019-05-12 — End: 2019-05-12

## 2019-05-12 MED ORDER — PANTOPRAZOLE SODIUM 40 MG PO TBEC
40.00 | DELAYED_RELEASE_TABLET | ORAL | Status: DC
Start: 2019-05-13 — End: 2019-05-12

## 2019-05-12 MED ORDER — INSULIN REGULAR HUMAN 100 UNIT/ML IJ SOLN
0.00 | INTRAMUSCULAR | Status: DC
Start: 2019-05-12 — End: 2019-05-12

## 2019-05-12 MED ORDER — LACTATED RINGERS IV SOLN
INTRAVENOUS | Status: DC
Start: ? — End: 2019-05-12

## 2019-05-12 MED ORDER — OXYCODONE HCL 5 MG PO TABS
5.00 | ORAL_TABLET | ORAL | Status: DC
Start: ? — End: 2019-05-12

## 2019-05-12 MED ORDER — CALCIUM CARBONATE ANTACID 750 MG PO CHEW
CHEWABLE_TABLET | ORAL | Status: DC
Start: ? — End: 2019-05-12

## 2019-05-12 MED ORDER — ACETAMINOPHEN 325 MG PO TABS
650.00 | ORAL_TABLET | ORAL | Status: DC
Start: ? — End: 2019-05-12

## 2019-05-13 ENCOUNTER — Telehealth: Payer: Self-pay | Admitting: Internal Medicine

## 2019-05-13 DIAGNOSIS — Z1159 Encounter for screening for other viral diseases: Secondary | ICD-10-CM | POA: Diagnosis not present

## 2019-05-13 DIAGNOSIS — E785 Hyperlipidemia, unspecified: Secondary | ICD-10-CM | POA: Diagnosis not present

## 2019-05-13 DIAGNOSIS — K219 Gastro-esophageal reflux disease without esophagitis: Secondary | ICD-10-CM | POA: Diagnosis not present

## 2019-05-13 DIAGNOSIS — Z9181 History of falling: Secondary | ICD-10-CM | POA: Diagnosis not present

## 2019-05-13 DIAGNOSIS — Z85828 Personal history of other malignant neoplasm of skin: Secondary | ICD-10-CM | POA: Diagnosis not present

## 2019-05-13 DIAGNOSIS — I10 Essential (primary) hypertension: Secondary | ICD-10-CM | POA: Diagnosis not present

## 2019-05-13 DIAGNOSIS — Z7952 Long term (current) use of systemic steroids: Secondary | ICD-10-CM | POA: Diagnosis not present

## 2019-05-13 DIAGNOSIS — Z483 Aftercare following surgery for neoplasm: Secondary | ICD-10-CM | POA: Diagnosis not present

## 2019-05-13 DIAGNOSIS — C719 Malignant neoplasm of brain, unspecified: Secondary | ICD-10-CM | POA: Diagnosis not present

## 2019-05-13 NOTE — Telephone Encounter (Signed)
Spoke w/ Dwyane Dee- verbal orders given.

## 2019-05-13 NOTE — Telephone Encounter (Signed)
Home Health Verbal Orders - Caller/Agency: [facility] Number: [phone] Requesting OT/PT/Skilled Nursing/Social Work/Speech Therapy: PT Frequency: 1 week 1 2 week3 1 week1 2 week 1 1 week 2

## 2019-05-16 ENCOUNTER — Telehealth: Payer: Self-pay | Admitting: *Deleted

## 2019-05-16 DIAGNOSIS — C719 Malignant neoplasm of brain, unspecified: Secondary | ICD-10-CM | POA: Diagnosis not present

## 2019-05-16 DIAGNOSIS — E785 Hyperlipidemia, unspecified: Secondary | ICD-10-CM | POA: Diagnosis not present

## 2019-05-16 DIAGNOSIS — Z7952 Long term (current) use of systemic steroids: Secondary | ICD-10-CM | POA: Diagnosis not present

## 2019-05-16 DIAGNOSIS — K219 Gastro-esophageal reflux disease without esophagitis: Secondary | ICD-10-CM | POA: Diagnosis not present

## 2019-05-16 DIAGNOSIS — I10 Essential (primary) hypertension: Secondary | ICD-10-CM | POA: Diagnosis not present

## 2019-05-16 DIAGNOSIS — Z483 Aftercare following surgery for neoplasm: Secondary | ICD-10-CM | POA: Diagnosis not present

## 2019-05-16 NOTE — Telephone Encounter (Signed)
Patient had MRI done in emergency room, canceled MRI that was scheduled for tomorrow.

## 2019-05-17 ENCOUNTER — Telehealth: Payer: Self-pay | Admitting: Internal Medicine

## 2019-05-17 ENCOUNTER — Ambulatory Visit (HOSPITAL_COMMUNITY): Payer: Medicare Other

## 2019-05-17 NOTE — Telephone Encounter (Signed)
Spoke w/ Don- verbal orders given.  

## 2019-05-17 NOTE — Telephone Encounter (Signed)
Caller name: Elenore Rota Relation to pt: OT from [place]  Call back number: [phone]    Reason for call:  Requesting verbal orders for OT total of 3 visit to address ADL, transfers, exercise and activity program, please advise

## 2019-05-18 ENCOUNTER — Other Ambulatory Visit: Payer: Self-pay

## 2019-05-19 ENCOUNTER — Other Ambulatory Visit: Payer: Self-pay | Admitting: Radiation Therapy

## 2019-05-20 ENCOUNTER — Other Ambulatory Visit: Payer: Self-pay

## 2019-05-20 ENCOUNTER — Telehealth: Payer: Self-pay | Admitting: Internal Medicine

## 2019-05-20 ENCOUNTER — Encounter: Payer: Self-pay | Admitting: Internal Medicine

## 2019-05-20 ENCOUNTER — Ambulatory Visit (INDEPENDENT_AMBULATORY_CARE_PROVIDER_SITE_OTHER): Payer: Medicare Other | Admitting: Internal Medicine

## 2019-05-20 VITALS — BP 128/62 | HR 78 | Temp 96.9°F | Resp 16 | Wt 184.0 lb

## 2019-05-20 DIAGNOSIS — G9389 Other specified disorders of brain: Secondary | ICD-10-CM | POA: Diagnosis not present

## 2019-05-20 NOTE — Progress Notes (Signed)
Subjective:    Patient ID: Brent Coleman, male    DOB: [birth year], 78 y.o.   MRN: [id number]  DOS:  05/20/2019 Type of visit - description: Acute, here with his wife   Since the last visit in April, he was diagnosed with brain mass on June 2020 during the work-up for a seizure. He had a brain biopsy a week ago;  needs 1 stitch to be removed.  Review of Systems Emotionally, has been very hard on him, fortunately he has good family support. Physically, so far is a stable, does have  right-sided weakness. Denies headaches, fever or chills.  Past Medical History:  Diagnosis Date  . Basal cell carcinoma    sees derm q year  . 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 Gallstone   . GERD (gastroesophageal reflux disease)   . Hyperlipidemia   . Hypertension   . Malignant neoplasm of prostate [facility]) ~ 2007   sees urology q year    Past Surgical History:  Procedure Laterality Date  . POLYPECTOMY    . TONSILLECTOMY  1949    Social History   Socioeconomic History  . Marital status: Married    Spouse name: Not on file  . Number of children: 2  . Years of education: Not on file  . Highest education level: Not on file  Occupational History  . Occupation: retired--  Scientific laboratory technician  . Financial resource strain: Not on file  . Food insecurity    Worry: Not on file    Inability: Not on file  . Transportation needs    Medical: Not on file    Non-medical: Not on file  Tobacco Use  . Smoking status: Never Smoker  . Smokeless tobacco: Never Used  Substance and Sexual Activity  . Alcohol use: Yes  . Drug use: No  . Sexual activity: Yes    Partners: Female  Lifestyle  . Physical activity    Days per week: Not on file    Minutes per session: Not on file  . Stress: Not on file  Relationships  . Social Herbalist on phone: Not on file    Gets together: Not on file    Attends religious service: Not on  file    Active member of club or organization: Not on file    Attends meetings of clubs or organizations: Not on file    Relationship status: Not on file  . Intimate partner violence    Fear of current or ex partner: Not on file    Emotionally abused: Not on file    Physically abused: Not on file    Forced sexual activity: Not on file  Other Topics Concern  . Not on file  Social History Narrative   HSG. College grad.    Married/ marriage is in good health. 2 Daughters. 2 grandchildren living in [place].    Work - retired from [employer] @ 89 and has enjoyed his retirement.            Allergies as of 05/20/2019      Reactions   Doxazosin Mesylate Other (See Comments)   REACTION: Abdominal pain      Medication List       Accurate as of May 20, 2019 11:43 AM. If you have any questions, ask your nurse or doctor.        dexamethasone 4 MG tablet Commonly known as: DECADRON Take  1 tablet (4 mg total) by mouth daily.   lacosamide 50 MG Tabs tablet Commonly known as: Vimpat Take 1 tablet (50 mg total) by mouth 2 (two) times daily.   lisinopril 20 MG tablet Commonly known as: ZESTRIL Take 1 tablet (20 mg total) by mouth daily.   multivitamin with minerals tablet Take 1 tablet by mouth daily.   TUMS PO Take 2 tablets by mouth at bedtime as needed (acid reflux).           Objective:   Physical Exam BP 128/62 (BP Location: Right Arm, Patient Position: Sitting, Cuff Size: Normal)   Pulse 78   Temp (!) 96.9 F (36.1 C) (Temporal)   Resp 16   Wt 184 lb (83.5 kg)   SpO2 98%   BMI 24.95 kg/m  General:   Well developed, NAD, BMI noted. HEENT:  Normocephalic . Face symmetric, atraumatic Skin: Single stitch at the left anterior scalp.  No redness swelling. Also has a small inward plantar wart near the left 5th toe Neurologic:  alert & oriented X3.  Speech normal, gait assisted by a rolling walker, + weakness right extremities, most noticeable proximally. Psych--   Cognition and judgment appear intact.  Cooperative with normal attention span and concentration.  Behavior appropriate. Very emotional, tearful.     Assessment     Assessment HTN Creatinine ~ 1.5 Hyperlipidemia GERD, h/o HH GB stones Prostate cancer, 2007, s/p XRT, saw Dr Shona Needles 09-2017, PSA was 0.28, was RX to [facility] prn, PCP to check PSA UA q year Skin cancer, BCC. Sees derm Seizure 03-2019 Brain mass DX 03/2019, BX 05-2019.  PLAN Brain mass: Recently diagnosed with a brain mass in the context of a seizure 2 months ago, had a biopsy a week ago, here to remove the suture which was done without any problems. Patient is obviously affected emotionally, support provided, knows to reach out if he feels he needs help. Wart, plantar: Recommend observation for now.  He will be very busy taking care of the other problem. Follow-up as needed.  Again encouraged to call me or other doctors if he feels emotionally needs help.

## 2019-05-20 NOTE — Telephone Encounter (Signed)
Home Health Verbal Orders - Caller/Agency: [facility] Callback Number: [phone] Requesting OT/PT/Skilled Nursing/Social Work/Speech Therapy: OT Frequency: add visit for next week  Pt decline OT visit for today due to family in town

## 2019-05-21 NOTE — Assessment & Plan Note (Signed)
  Brain mass: Recently diagnosed with a brain mass in the context of a seizure 2 months ago, had a biopsy a week ago, here to remove the suture which was done without any problems. Patient is obviously affected emotionally, support provided, knows to reach out if he feels he needs help. Wart, plantar: Recommend observation for now.  He will be very busy taking care of the other problem. Follow-up as needed.  Again encouraged to call me or other doctors if he feels emotionally needs help.

## 2019-05-23 NOTE — Telephone Encounter (Signed)
LMOM w/ verbal orders.  

## 2019-05-24 DIAGNOSIS — Z7952 Long term (current) use of systemic steroids: Secondary | ICD-10-CM | POA: Diagnosis not present

## 2019-05-24 DIAGNOSIS — C711 Malignant neoplasm of frontal lobe: Secondary | ICD-10-CM | POA: Diagnosis not present

## 2019-05-24 DIAGNOSIS — E785 Hyperlipidemia, unspecified: Secondary | ICD-10-CM | POA: Diagnosis not present

## 2019-05-24 DIAGNOSIS — K219 Gastro-esophageal reflux disease without esophagitis: Secondary | ICD-10-CM | POA: Diagnosis not present

## 2019-05-24 DIAGNOSIS — I10 Essential (primary) hypertension: Secondary | ICD-10-CM | POA: Diagnosis not present

## 2019-05-24 DIAGNOSIS — C719 Malignant neoplasm of brain, unspecified: Secondary | ICD-10-CM | POA: Diagnosis not present

## 2019-05-24 DIAGNOSIS — Z483 Aftercare following surgery for neoplasm: Secondary | ICD-10-CM | POA: Diagnosis not present

## 2019-05-25 ENCOUNTER — Telehealth: Payer: Self-pay

## 2019-05-25 DIAGNOSIS — K219 Gastro-esophageal reflux disease without esophagitis: Secondary | ICD-10-CM

## 2019-05-25 DIAGNOSIS — I10 Essential (primary) hypertension: Secondary | ICD-10-CM

## 2019-05-25 DIAGNOSIS — C719 Malignant neoplasm of brain, unspecified: Secondary | ICD-10-CM

## 2019-05-25 DIAGNOSIS — Z483 Aftercare following surgery for neoplasm: Secondary | ICD-10-CM

## 2019-05-25 DIAGNOSIS — Z7952 Long term (current) use of systemic steroids: Secondary | ICD-10-CM

## 2019-05-25 DIAGNOSIS — E785 Hyperlipidemia, unspecified: Secondary | ICD-10-CM

## 2019-05-25 DIAGNOSIS — Z1159 Encounter for screening for other viral diseases: Secondary | ICD-10-CM

## 2019-05-25 DIAGNOSIS — Z85828 Personal history of other malignant neoplasm of skin: Secondary | ICD-10-CM

## 2019-05-25 DIAGNOSIS — Z9181 History of falling: Secondary | ICD-10-CM

## 2019-05-25 NOTE — Telephone Encounter (Signed)
Plan of care received from [facility]- orders signed and faxed to [phone]. Form sent for scanning.

## 2019-05-27 ENCOUNTER — Telehealth: Payer: Self-pay | Admitting: Internal Medicine

## 2019-05-27 NOTE — Telephone Encounter (Signed)
Noted, thank you

## 2019-05-27 NOTE — Telephone Encounter (Signed)
FYI

## 2019-05-27 NOTE — Telephone Encounter (Signed)
Brent Coleman, from [facility], called stating pt refused OT. Brent Coleman states he is removing pt from [employer]. Brent Coleman states the pt is moving. Please advise   [phone]

## 2019-05-31 ENCOUNTER — Other Ambulatory Visit: Payer: Medicare Other

## 2019-06-06 NOTE — Telephone Encounter (Signed)
requesting outside records from [facility].

## 2019-06-06 NOTE — Telephone Encounter (Signed)
Primary Cancer: Prostate Cancer  Diagnosis Date: 2007  Molecular:  Location of CNS malignancy: Brain   Age at diagnosis of primary cancer: 78y/o, right-handed male      CNS directed history:  1. 03/19/2019: Presented 5 days history of pain at posterior and left side of the tongue at [facility], MRI imgage done at [facility] revealed non enhancing left frontal mass  2.  05/05/2019: ED visit for worsening right-sided weakness  3. 05/12/2019: Biopsy by Dr. Jorene Minors.  Pathology: Glioblastoma ([place])  4. Present: Pt had just moved from [place] to [facility], want to establish care at [facility] recent cancer treatment: N/A      -Referral source: Dr. Encarnacion Slates  -Reason:  Establish care at [place] in [facility]  -images: pending  -Most recent MRI: 05/12/2019, schedule the new one on 06/08/2019 @8 :15am  -Patient prefers: Video      NN Assessment:  -Language:  English   -Caregiver: Sarah (daughter)  -Patient communication preferences: [place] of residence: [place], [place] from [place]: > 50 - < 100 miles  -Insurance: Pharmacist, hospital  -Does patient have access to Internet and a smart phone or computer with a camera? Yes  -Barrier/concern identified: Right-side paralysis     -MyChart Activated: No   -Date Mychart Link Sent: 06/06/2019    -Name of radiology facility: None  -Oncologist: None  -Radiation Oncologist: None  -Neurosurgeon: Dr. Jorene Minors

## 2019-06-07 ENCOUNTER — Ambulatory Visit: Admit: 2019-06-07 | Discharge: 2019-06-08 | Payer: MEDICARE | Attending: Radiation Oncology

## 2019-06-07 DIAGNOSIS — C719 Malignant neoplasm of brain, unspecified: Secondary | ICD-10-CM

## 2019-06-07 MED ORDER — LORAZEPAM 0.5 MG TABLET
0.5 | ORAL_TABLET | Freq: Once | ORAL | 0 refills | Status: AC | PRN
Start: 2019-06-07 — End: ?

## 2019-06-08 ENCOUNTER — Ambulatory Visit: Admit: 2019-06-08 | Discharge: 2019-06-08 | Payer: MEDICARE

## 2019-06-08 ENCOUNTER — Ambulatory Visit: Admit: 2019-06-08 | Discharge: 2019-06-09 | Payer: MEDICARE

## 2019-06-08 DIAGNOSIS — F0631 Mood disorder due to known physiological condition with depressive features: Secondary | ICD-10-CM | POA: Diagnosis not present

## 2019-06-08 DIAGNOSIS — G8190 Hemiplegia, unspecified affecting unspecified side: Secondary | ICD-10-CM | POA: Diagnosis not present

## 2019-06-08 DIAGNOSIS — C711 Malignant neoplasm of frontal lobe: Secondary | ICD-10-CM | POA: Diagnosis not present

## 2019-06-08 DIAGNOSIS — C719 Malignant neoplasm of brain, unspecified: Secondary | ICD-10-CM | POA: Diagnosis not present

## 2019-06-08 DIAGNOSIS — W19XXXA Unspecified fall, initial encounter: Secondary | ICD-10-CM

## 2019-06-08 MED ORDER — GADOTERATE MEGLUMINE 0.5 MMOL/ML (376.9 MG/ML) INTRAVENOUS SOLUTION
0.5 | Freq: Once | INTRAVENOUS | Status: AC
Start: 2019-06-08 — End: 2019-06-08
  Administered 2019-06-08: 17:00:00 via INTRAVENOUS

## 2019-06-08 MED ORDER — DEXAMETHASONE 2 MG TABLET
2 | ORAL_TABLET | Freq: Two times a day (BID) | ORAL | 3 refills | Status: AC
Start: 2019-06-08 — End: 2019-07-08

## 2019-06-08 NOTE — Progress Notes (Addendum)
NEURO-ONCOLOGY NEW PATIENT VISIT    DATE OF VISIT: 06/08/2019    REFERRING PHYSICIAN: Dr. Encarnacion Slates    REASON FOR VISIT: Management of GBM.    Patrick Goodman is a 78 y.o., man with the following tumor history:    Primary Cancer: high-grade glial neoplasm, IDHwt, MGMT methylation unknown  Diagnosis Date: 05/12/2019  Location of CNS malignancy: right frontal  Age at diagnosis of primary cancer: 78y/o, right-handed male    CNS directed history:  1. 03/19/2019: Presented with focal motor seizure that began with left leg shaking and progressed to LOC at [facility], MRI imgage done at [facility] revealed non enhancing left frontal mass  2.  05/05/2019: ED visit for worsening right-sided weakness  3. 05/12/2019: Biopsy by Dr. Jorene Minors.  Pathology: Glioblastoma ([place])  4. Present: Pt had just moved from [place] to [facility], want to establish care at [facility]:  Mr. Patrick Goodman is a 78 y.o. right-handed man with a right frontal GBM.  03/19/2019: Presented 5 days history of pain at posterior and left side of the tongue at [facility], MRI imgage done at [facility] revealed non enhancing left frontal mass.    05/05/2019: ED visit for worsening right-sided weakness  05/12/2019: Biopsy by Dr. Jorene Minors. Resection not pursued due to concern for worsening weakness. Pathology: Glioblastoma (Reviewed [place]), IDH wt by [facility]. No MGMT methylation results available.    Pt had just moved from [place] to [place] to be closer to daughter and establish care at [facility].  Has had increased difficulty with mobility. Currently using walker.  Had been on dex 18m dialy reduced from bid but has noticed increased weakness on the right side over the past few days.     No services set up at home.  Wife is also elderly and requiring oxygen.    While here for the visit, the patient fell in front of the Men's Restroom on the 8th Floor of [employer]  patient was sitting on the floor with a walker in front of him; area was safe with enough space around patient to not block pathways to clinics; patient was alert and oriented to date, time, person and place; he stated his name and reported "I fell but I'm unable to get up." "I was trying to go to the restroom but wasn't able to open doors. The handicapped bathroom was occupied but I needed to go."  Lift equipment was used to hoist patient into a wheelchair.  He had two lacerations to right knee and left forearm.      PAST MEDICAL HISTORY:  Past Medical History:   Diagnosis Date    Basal cell carcinoma (BCC) in situ of skin     Constipation     Hypertension     Seizures (CMS code)        PAST SURGICAL HISTORY:  History reviewed. No pertinent surgical history.    FAMILY HISTORY:  Family History   Problem Relation Name Age of Onset    Brain cancer Neg Hx         SOCIAL HISTORY:  Social History     Socioeconomic History    Marital status: Married     Spouse name: Not on file    Number of children: Not on file    Years of education: Not on file    Highest education level: Not on file   Tobacco Use  Smoking status: Never Smoker   Substance and Sexual Activity    Alcohol use: Not Currently    Drug use: Not on file    Sexual activity: Not on file   Other Topics Concern    Not on file   Social History Narrative    Not on file     Occupation   No occupation listed    MEDICATIONS:  Medications the patient states to be taking prior to today's encounter.   Medication Sig    bisacodyL (DULCOLAX) 5 mg EC tablet Take 15 mg by mouth daily after dinner    lacosamide (VIMPAT) 50 mg tablet Take 50 mg by mouth 2 (two) times daily    lisinopriL (PRINIVIL,ZESTRIL) 10 mg tablet Take 20 mg by mouth daily    polyethylene glycol (MIRALAX) 17 gram/dose powder Take 17 g by mouth daily       ALLERGIES:  Allergies as of 06/08/2019    (Not on File)       REVIEW OF SYSTEMS: 14 point review of systems completed.  Over the last  30 days, the patient attests to pertinent positives and negative listed in HPI.  Otherwise all others negative.    PHYSICAL EXAMINATION:     Vital Signs: 145/85, 87, 98% RA, Afebrile.    KPS: 65    General Exam:  He is well a appearing male in no acute distress.   Head and neck: Well-healed biopsy scar without drainage or errythema. Oropharynx clear without lesions. No thrush.  Lungs: clear to auscultation bilaterally.   Heart: regular rate and rhythm. No murmurs, rubs or gallop.   Abdomen: soft and non-tender without palpable masses.   Extremities: Lower extremities without peripheral edema or calf tenderness.   Skin: Skin laceration related to fall.     Neurologic Exam:Oriented to person, place, and time. Appropriate attention, memory, and affect. Speech fluent without dysarthria or aphasia - specifically naming, repetition, and comprehension were intact.   Cranial Nerves: Pupils equally round and reactive to light. Extraocular movements intact without nystagmus. Visual fields full to confrontation. Face symmetric with sensation intact V1-3 to light touch bilaterally. Hearing symmetric to finger rub. Tongue midline. Uvula and palate symmetric.    Motor Exam: Spastic right hemiparesis. Right arm 2/5 throughout, Right leg 3/5 with extensors and 2/5 with flexors.    Stance and Gait:Came in with walker but had fall due to weakness.    IMAGING: I personally reviewed the most recent MRI and compared to prior.   MRI brain 06/08/19  FINDINGS:    Centered in the left posterior frontal lobe is a peripherally enhancing mass measuring 6.1 x 3.8 x 4.8 cm (AP by TV by CC) with internal susceptibility and peripherally elevated cerebral blood flow. No restricted diffusion. The mass is increased in size from 05/05/2019 when it measured 4.5 x 3.2 x 4.0 cm. The mass also extends to the motor cortex at its superior margin and abuts the falx and superior sagittal sinus.    Infiltrative FLAIR hyperintensity extends from the mass across  the posterior body of the corpus callosum. Cortical thickening and FLAIR hyperintensity of the left posterior cingulate and left precuneus which may represent adjacent site of infiltrative disease.    Mild associated edema at the periphery of the lesion. Local mass effect with sulcal effacement and effacement of the posterior body of the left lateral ventricle. No downward herniation.    No acute intracranial hemorrhage, acute infarct, or hydrocephalus. Postbiopsy changes of the calvarium.  Nonspecific punctate T2/FLAIR hyperintensities in the deep and periventricular white matter, nonspecific.    IMPRESSION:     Compared with 05/05/2019, increased size of centrally necrotic mass centered in the left posterior frontal lobe with infiltrative FLAIR which extends across the posterior body of the corpus callosum, compatible with biopsied glioblastoma. Cortical thickening and FLAIR hyperintensity of the left posterior cingulate and left precuneus consistent with an adjacent site of infiltrative disease.    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 Sanfilippo is a 78 y.o. 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    1. GBM.  I had an extensive discussion with the patient regarding his diagnosis of a glioblastoma.  I explained the aggressive nature of these tumors and that current treatment is not curative but rather serves to control the tumor for a period of time.  That period of time can be variable and difficult to predict. I reviewed standard treatment, which includes 6 weeks of radiation to the area of the tumor in combination with a chemotherapy called temozolomide. However, given his current deficits and concern for tolerability of 6 week course, we discussed an abbreviated 3  week course and patient expressed preference for that course of treatment. Will proceed with 3 weeks RT and temodar concurrent '150mg'$ /m2 x 3 weeks during RT.  I reviewed the common side effects of radiation including hair loss, skin irritation, fatigue, swelling around the area of the tumor/tumor bed which may require the use of steroids and may result in transient symptoms, and difficulty with memory and concentration that may persist.  I reviewed the common side effects of temozolomide including nausea, constipation, hair thinning, myelosuppression, liver toxicity.  I reviewed the typical dose, how to take the medication, and premedication that is required.  I also reviewed the need for CBCs to monitor blood counts while on chemotherapy.  Following the end of radiation, I explained that he would have a break from treatment for a period of 3-4 weeks after which a new MRI would be obtained to establish the baseline and at that time we would discuss restarting temozolomide but at a different dose and different dosing schedule. He expressed understanding.    I did review his imaging with Dr. Franchot Erichsen to ensure there wasn't a role for resection.  While 90%+ could be removed, Dr. Maylene Roes explained there would be a period of worsening weakness postoperatively that will require rehabilitation and may be permanent. After discussion with the patient, he explained that while he is in agreement with radiation, he would not want to move forward with treatment that would not help improve his QOL and only extend his life.     Rad on referral placed.    **Will need weight when he returns 06/16/19 for an appointment with Rad ONc.    2. Right hemiparesis:  Will place referral for Home health for home safety eval, PT/OT, home health aid, and for durable equipment needs given high falls risk.    Will increase dex to '4mg'$  bid to see if there is any symptomatic benefit.    3. Advance care planning:  Patient expressed a desire to not  extend life without  benefit in QOL.  I will place referral to palliative care for symptomatic support through treatment as well as for help in advance care planning.     4. Fall:  Patient fell while in clinic today due to hemiparesis and difficulty opening bathroom chair while using walker. Sustained two lacerations. I examined wounds, cleaned then, applied antibiotic ointment and wrapped them.  Provided instruction on care and requested home services with wound care. No head injury, able to move arms and legs without pain.  I followed up on 06/09/2019 via telephone call as well to ensure patient was not experiencing any new symptoms.  He reported no new symptoms and appreciated the call.     Follow-up:  - Will plan for followup in 2-3 weeks while he is here for radiation.  Will also plan for repeat MRI 4 weeks after completion of radiation in 8-9 weeks with a followup. This scan is critical because based on the results, a change in the management plan for the patient may need to be made especially if there is any evidence to suggest tumor progression.    - All questions of Zackarie Chason and his family were answered to the best of my ability. They have our contact information and will call with further questions.     Orders Placed This Encounter    MR Brain with and without Contrast    Ambulatory referral to Radiation Oncology    Ambulatory referral to [facility]    Ambulatory Referral to Symptom Management Service (palliative care for patients with cancer, SMS)    dexAMETHasone (DECADRON) 2 mg tablet    lacosamide (VIMPAT) 50 mg tablet    lisinopriL (PRINIVIL,ZESTRIL) 10 mg tablet    bisacodyL (DULCOLAX) 5 mg EC tablet    polyethylene glycol (MIRALAX) 17 gram/dose powder       I spent a total of 80 minutes (12-1:20pm) face-to-face with the patient and 55 minutes of that time was spent reviewing the imaging with the patient, review of management options, side effects, counseling and coordination of care.

## 2019-06-10 ENCOUNTER — Other Ambulatory Visit: Payer: Medicare Other

## 2019-06-10 DIAGNOSIS — C719 Malignant neoplasm of brain, unspecified: Secondary | ICD-10-CM | POA: Insufficient documentation

## 2019-06-14 ENCOUNTER — Ambulatory Visit: Payer: Medicare Other | Admitting: Internal Medicine

## 2019-06-14 DIAGNOSIS — G8190 Hemiplegia, unspecified affecting unspecified side: Secondary | ICD-10-CM

## 2019-06-14 DIAGNOSIS — R569 Unspecified convulsions: Secondary | ICD-10-CM

## 2019-06-14 MED ORDER — POLYETHYLENE GLYCOL 3350 17 GRAM/DOSE ORAL POWDER
17 | ORAL | Status: AC
Start: 2019-06-14 — End: ?

## 2019-06-14 MED ORDER — LISINOPRIL 10 MG TABLET
10 | ORAL | Status: AC
Start: 2019-06-14 — End: ?

## 2019-06-14 MED ORDER — BISACODYL 5 MG TABLET,DELAYED RELEASE
5 | ORAL | Status: AC
Start: 2019-06-14 — End: ?

## 2019-06-14 MED ORDER — LACOSAMIDE 50 MG TABLET
50 | ORAL | Status: DC
Start: 2019-06-14 — End: 2019-06-29

## 2019-06-14 NOTE — Telephone Encounter (Signed)
Recommended suppository for constipation relief and will follow up with patient to see how they are doing.

## 2019-06-15 DIAGNOSIS — C711 Malignant neoplasm of frontal lobe: Secondary | ICD-10-CM | POA: Diagnosis not present

## 2019-06-15 DIAGNOSIS — G8191 Hemiplegia, unspecified affecting right dominant side: Secondary | ICD-10-CM | POA: Diagnosis not present

## 2019-06-15 DIAGNOSIS — G40909 Epilepsy, unspecified, not intractable, without status epilepticus: Secondary | ICD-10-CM | POA: Diagnosis not present

## 2019-06-15 DIAGNOSIS — I10 Essential (primary) hypertension: Secondary | ICD-10-CM | POA: Diagnosis not present

## 2019-06-15 DIAGNOSIS — Z9181 History of falling: Secondary | ICD-10-CM | POA: Diagnosis not present

## 2019-06-15 DIAGNOSIS — F0631 Mood disorder due to known physiological condition with depressive features: Secondary | ICD-10-CM | POA: Diagnosis not present

## 2019-06-15 NOTE — Telephone Encounter (Signed)
Visit Navigator updated for appt RAD ONC [facility] 9/10

## 2019-06-16 ENCOUNTER — Ambulatory Visit: Admit: 2019-06-16 | Discharge: 2019-06-17 | Payer: MEDICARE | Attending: Radiation Oncology

## 2019-06-16 DIAGNOSIS — C711 Malignant neoplasm of frontal lobe: Secondary | ICD-10-CM | POA: Diagnosis not present

## 2019-06-16 DIAGNOSIS — C719 Malignant neoplasm of brain, unspecified: Secondary | ICD-10-CM

## 2019-06-16 NOTE — Telephone Encounter (Signed)
Spoke with patient's daughter to check in since patient missed his Nursing Visit this morning; daughter stated, patient saw Dr. Marijo File this morning and decided to go on hospice; she stated it was a "heavy" morning and she was hoping to speak with Dr. Lyndal Pulley if possible.

## 2019-06-16 NOTE — H&P (Signed)
DEPARTMENT OF RADIATION ONCOLOGY CONSULTATION NOTE  [facility]  Department of Radiation Oncology  [address] [place], [place]  Telephone: [phone]  Fax: [phone]    Name:    Patrick Goodman  Medical record number: [id number]  Date of birth:   [birth year]  Referring Physician:  Mady Haagensen, MD  Primary Care Physician: None Per Patient Provider  Date of service:  06/16/19  Diagnosis:   (C71.9) Glioblastoma (CMS code)  (primary encounter diagnosis)  Stage:    N/A    Identification:  Patrick Goodman is a 78 y.o. male with left frontal glioblastoma, WHO grade 4, status post biopsy at [facility] on 05/12/19, revealing IDH wild type malignancy.    History of Present Illness:  Patrick Goodman initially presented to medical attention on 03/19/19 after experiencing a focal motor seizure that started with left leg shaking and progressed to a loss of consciousness. An MRI that was done at [facility] at that time revealed a non-enhancing left frontal mass. The patient elected not to pursue further treatment, and re-presented to the emergency department on 05/05/19 with right-sided weakness. A biopsy of the aforementioned mass was ultimately performed on 05/12/19 at [facility], with surgical pathology revealing glioblastoma, WHO grade 4, with wild type IDH. Mr. Wik subsequently relocated to the [facility] from [place], and established care with Dr. Mady Haagensen from Neuro-Oncology at [facility] on 06/08/19.    Today, Patrick Goodman reports that he remains significantly debilitated from right-sided paresis, and is unwilling to pursue any treatment unless it could restore him to his pre-diagnosis functional baseline.     Past Medical History:   Diagnosis Date    Basal cell carcinoma (BCC) in situ of skin     Constipation     Hypertension     Seizures (CMS code)      History reviewed. No pertinent surgical history.     History of Previous Radiation Therapy:  No    History of Collagen Vascular Disease: No    History of Pacemaker/Debrillator Implantation: No    Review of Systems:   14 systems were reviewed and are negative except as specifically mentioned in the History of Present Illness, including these two: respiratory and cardiac.    Allergies/Contraindications  No Known Allergies  Current Medications       Dosage    bisacodyL (DULCOLAX) 5 mg EC tablet Take 15 mg by mouth daily after dinner    dexAMETHasone (DECADRON) 2 mg tablet Take 2 tablets (4 mg total) by mouth 2 (two) times daily with meals for 30 days    lacosamide (VIMPAT) 50 mg tablet Take 50 mg by mouth 2 (two) times daily    lisinopriL (PRINIVIL,ZESTRIL) 10 mg tablet Take 20 mg by mouth daily    LORazepam (ATIVAN) 0.5 mg tablet Take 1 tablet (0.5 mg total) by mouth once as needed for Anxiety (MRI) for up to 6 doses    polyethylene glycol (MIRALAX) 17 gram/dose powder Take 17 g by mouth daily        Family History   Problem Relation Name Age of Onset    Brain cancer Neg Hx       Social History     Social History Narrative    Not on file     Social History     Tobacco Use    Smoking status: Never Smoker    Smokeless tobacco: Never Used   Substance Use Topics  Alcohol use: Not Currently     Physical Examination:  BP 134/62 (BP Location: Left upper arm)   Pulse 72   Temp 36.5 C (97.7 F) (Temporal)   Resp 18   Ht 185.4 cm ('6\' 1"' )   Wt 85.3 kg (188 lb) Comment: stated weight   SpO2 97%   BMI 24.80 kg/m   Pain Score: 0/10, location: 0  KPS: 60% - Requiring some help, can take care of most personal requirements  General: Alert, well appearing, and in no distress; oriented to person, place, and time  Eyes: Pupils equal, round and reactive to light; extraocular eye movements intact   Head/neck: Normocephalic and atraumatic  Ears: External pinna normal  Nose: Normal and patent, no erythema   Throat: Mucous membranes moist  Chest: Normal respiratory effort  Neurological: Fluent speech with normal  though content, CN II-XII grossly intact, right-sided paresis  Extremities: No clubbing or cyanosis   Skin: Normal coloration and turgor, no rashes, no suspicious skin lesions noted    Pathology:   Personally reviewed, with findings as per the History of Present Illness.    Radiology:   MR BRAIN WITH AND WITHOUT CONTRAST 06/08/2019  Compared with 05/05/2019, increased size of centrally necrotic mass centered in the left posterior frontal lobe with infiltrative FLAIR which extends across the posterior body of the corpus callosum, compatible with biopsied glioblastoma. Cortical thickening and FLAIR hyperintensity of the left posterior cingulate and left precuneus consistent with an adjacent site of infiltrative disease.    Laboratory Studies:   Personally reviewed and are non-contributory except as specifically mentioned in the History of Present Illness.     Assessment:  78 y.o. male with left frontal glioblastoma, WHO grade 4, status post biopsy at [facility] on 05/12/19, revealing IDH wild type malignancy.    We had an extensive discussion with Patrick Goodman, during which we discussed the natural history, treatment and prognosis of glioblastoma multiforme. Although there are currently no curative therapeutic regimens for glioblastoma, patient age, performance status, tumor location and extent of resection, molecular features, and adjuvant therapy all influence prognosis (Eckel-Passow et al. Alison Stalling J Med 2015). In that regard, there are several negative prognostic factors in Colo Rhodus's case, including his advanced age, functional debilitation, and unresectable disease. For such patients, outcomes with hypofractionated radiotherapy are equivalent to standard radiation, the latter of which may be associated with untoward logistic or physical challenges for patients with overwhelmingly negative prognostic factors (Roa et al. J Clin Oncol 2004; Roa et al. J Clin Oncol 2015). Supportive care is another option  available to [facility], but randomized data suggests that hypofractionated radiotherapy is associated with a modest improvement in survival without reducing quality of life or cognition (Keime-Guibert et al. Alison Stalling J Med 2007). Similarly, systemic therapy with temozolomide appears to be superior to supportive care alone for elderly patients with glioblastoma (Perez-Larraya et al. Lenna Sciara Clin Oncol 2011). Moreover, the addition of concurrent and adjuvant temozolomide to hypofractionated radiotherapy for high-risk glioblastoma appears to moderately prolong survival Henrene Pastor et al. Lenna Sciara Clin Oncol 2016). As such, the standard therapy for high-risk glioblastoma consists of maximal safe resection followed by hypofractionated radiation with or without concurrent and adjuvant temozolomide.     Patrick Goodman asked appropriate questions, which were answered to their satisfaction. Having discussed the indications, methods, risks, benefits, and alternatives to the aforementioned course of treatment, Brax Walen wishes to proceed with hospice enrollment, and wants no further treatment    Plan:  [  1] Referral back to Dr. Lyndal Pulley for consideration of hospice enrollment.    Thank you for allowing [place] to participate in [facility] care. Please feel free to contact us with any questions or concerns.    Sonia Baller, MD, PhD  Assistant Professor  Departments of Radiation Oncology and Neurological Surgery  [place] [facility]    Answers for HPI/ROS submitted by the patient on 06/15/2019   weight loss: Yes  malaise/fatigue: Yes  weakness: Yes  heartburn: Yes  constipation: Yes  Falls: Yes  focal weakness: Yes  Insomnia: Yes

## 2019-06-16 NOTE — Telephone Encounter (Signed)
Patient fell while in clinic 06/08/19. I followed today via telephone call as well to ensure patient was not experiencing any new symptoms.  He reported no new symptoms and appreciated the call.

## 2019-06-16 NOTE — Telephone Encounter (Signed)
Called Sarah twice but was unable to get through to her. Left a message with my office and cell number. Will call again tomorrow.

## 2019-06-17 ENCOUNTER — Encounter: Admit: 2019-06-17 | Discharge: 2019-06-17

## 2019-06-17 DIAGNOSIS — F0631 Mood disorder due to known physiological condition with depressive features: Secondary | ICD-10-CM | POA: Diagnosis not present

## 2019-06-17 DIAGNOSIS — Z9181 History of falling: Secondary | ICD-10-CM | POA: Diagnosis not present

## 2019-06-17 DIAGNOSIS — G40909 Epilepsy, unspecified, not intractable, without status epilepticus: Secondary | ICD-10-CM | POA: Diagnosis not present

## 2019-06-17 DIAGNOSIS — I10 Essential (primary) hypertension: Secondary | ICD-10-CM | POA: Diagnosis not present

## 2019-06-17 DIAGNOSIS — G8191 Hemiplegia, unspecified affecting right dominant side: Secondary | ICD-10-CM | POA: Diagnosis not present

## 2019-06-17 DIAGNOSIS — C711 Malignant neoplasm of frontal lobe: Secondary | ICD-10-CM | POA: Diagnosis not present

## 2019-06-17 NOTE — Telephone Encounter (Signed)
Spoke with admission RN at [facility] and palliative care. They are accepting new patients, we will fax over referral documents. Informed them our office has not spoken to patient's daughter yet re decision to move forward with hospice, Dr. Lyndal Pulley has called a few times today, but they have not been in touch.     There office will reach out to patient's family on Monday upon receiving admission paperwork to discuss hospice services.

## 2019-06-17 NOTE — Progress Notes (Signed)
[facility] Better Caregiver Program  "New to Clinic" Introduction    SW reached out to introduce caregiver program and left vm requesting call back. Pt and caregiver were invited to reach out at any time for concrete or emotional support.    Justice Britain, MSW  Ph [phone]

## 2019-06-18 DIAGNOSIS — I10 Essential (primary) hypertension: Secondary | ICD-10-CM | POA: Diagnosis not present

## 2019-06-18 DIAGNOSIS — C711 Malignant neoplasm of frontal lobe: Secondary | ICD-10-CM | POA: Diagnosis not present

## 2019-06-18 DIAGNOSIS — G40909 Epilepsy, unspecified, not intractable, without status epilepticus: Secondary | ICD-10-CM | POA: Diagnosis not present

## 2019-06-18 DIAGNOSIS — Z9181 History of falling: Secondary | ICD-10-CM | POA: Diagnosis not present

## 2019-06-18 DIAGNOSIS — G8191 Hemiplegia, unspecified affecting right dominant side: Secondary | ICD-10-CM | POA: Diagnosis not present

## 2019-06-18 DIAGNOSIS — F0631 Mood disorder due to known physiological condition with depressive features: Secondary | ICD-10-CM | POA: Diagnosis not present

## 2019-06-20 DIAGNOSIS — I10 Essential (primary) hypertension: Secondary | ICD-10-CM | POA: Diagnosis not present

## 2019-06-20 DIAGNOSIS — G40909 Epilepsy, unspecified, not intractable, without status epilepticus: Secondary | ICD-10-CM | POA: Diagnosis not present

## 2019-06-20 DIAGNOSIS — C711 Malignant neoplasm of frontal lobe: Secondary | ICD-10-CM | POA: Diagnosis not present

## 2019-06-20 DIAGNOSIS — F0631 Mood disorder due to known physiological condition with depressive features: Secondary | ICD-10-CM | POA: Diagnosis not present

## 2019-06-20 DIAGNOSIS — G8191 Hemiplegia, unspecified affecting right dominant side: Secondary | ICD-10-CM | POA: Diagnosis not present

## 2019-06-20 DIAGNOSIS — Z9181 History of falling: Secondary | ICD-10-CM | POA: Diagnosis not present

## 2019-06-20 NOTE — Telephone Encounter (Signed)
Received call from RN at [facility] confirming they received the hospice referral and will be reaching out to patient/family today. Depending on pt/family avaialiblity, [facility] should be able to see pt today 9/14 or tomorrow 9/15. Will inform Dr. Lyndal Pulley.

## 2019-06-21 DIAGNOSIS — F0631 Mood disorder due to known physiological condition with depressive features: Secondary | ICD-10-CM | POA: Diagnosis not present

## 2019-06-21 DIAGNOSIS — G40909 Epilepsy, unspecified, not intractable, without status epilepticus: Secondary | ICD-10-CM | POA: Diagnosis not present

## 2019-06-21 DIAGNOSIS — Z9181 History of falling: Secondary | ICD-10-CM | POA: Diagnosis not present

## 2019-06-21 DIAGNOSIS — I10 Essential (primary) hypertension: Secondary | ICD-10-CM | POA: Diagnosis not present

## 2019-06-21 DIAGNOSIS — G8191 Hemiplegia, unspecified affecting right dominant side: Secondary | ICD-10-CM | POA: Diagnosis not present

## 2019-06-21 DIAGNOSIS — C711 Malignant neoplasm of frontal lobe: Secondary | ICD-10-CM | POA: Diagnosis not present

## 2019-06-22 DIAGNOSIS — C711 Malignant neoplasm of frontal lobe: Secondary | ICD-10-CM | POA: Diagnosis not present

## 2019-06-24 DIAGNOSIS — C711 Malignant neoplasm of frontal lobe: Secondary | ICD-10-CM | POA: Diagnosis not present

## 2019-06-25 DIAGNOSIS — C711 Malignant neoplasm of frontal lobe: Secondary | ICD-10-CM | POA: Diagnosis not present

## 2019-06-27 DIAGNOSIS — C711 Malignant neoplasm of frontal lobe: Secondary | ICD-10-CM | POA: Diagnosis not present

## 2019-06-28 DIAGNOSIS — C711 Malignant neoplasm of frontal lobe: Secondary | ICD-10-CM | POA: Diagnosis not present

## 2019-06-29 ENCOUNTER — Ambulatory Visit: Admit: 2019-06-29 | Discharge: 2019-06-29 | Payer: MEDICARE

## 2019-06-29 DIAGNOSIS — R569 Unspecified convulsions: Secondary | ICD-10-CM | POA: Diagnosis not present

## 2019-06-29 DIAGNOSIS — C711 Malignant neoplasm of frontal lobe: Secondary | ICD-10-CM | POA: Diagnosis not present

## 2019-06-29 DIAGNOSIS — F0631 Mood disorder due to known physiological condition with depressive features: Secondary | ICD-10-CM | POA: Diagnosis not present

## 2019-06-29 MED ORDER — LACOSAMIDE 50 MG TABLET
50 | ORAL_TABLET | Freq: Two times a day (BID) | ORAL | 11 refills | Status: DC
Start: 2019-06-29 — End: 2019-07-21

## 2019-06-29 NOTE — Progress Notes (Signed)
NEURO-ONCOLOGY FOLLOWUP VISIT    DATE OF VISIT: 06/29/2019    REFERRING PHYSICIAN: Dr. Encarnacion Slates    REASON FOR VISIT: Management of GBM.    Patrick Goodman is a 78 y.o., man with the following tumor history:    Primary Cancer: high-grade glial neoplasm, IDHwt, MGMT methylation unknown  Diagnosis Date: 05/12/2019  Location of CNS malignancy: right frontal  Age at diagnosis of primary cancer: 78y/o, right-handed male    CNS directed history:  1. 03/19/2019: Presented with focal motor seizure that began with left leg shaking and progressed to LOC at [facility] MRI imgage done at [facility] revealed non enhancing left frontal mass  2.  05/05/2019: ED visit for worsening right-sided weakness  3. 05/12/2019: Biopsy by Dr. Jorene Minors.  Pathology: Glioblastoma ([place])  4. Present: Pt had just moved from [place] to [place], to establish care at [facility] and made decision to proceed with hospice.    HISTORY OF PRESENT ILLNESS:  Patrick Goodman is a 78 y.o. right-handed man with a right frontal GBM.  03/19/2019: Presented 5 days history of pain at posterior and left side of the tongue at [facility], MRI imgage done at [facility] revealed non enhancing left frontal mass.    05/05/2019: ED visit for worsening right-sided weakness  05/12/2019: Biopsy by Dr. Jorene Minors. Resection not pursued due to concern for worsening weakness. Pathology: Glioblastoma (Reviewed [place]), IDH wt by [facility]. No MGMT methylation results available.    Pt had just moved from [place] to [place] to be closer to daughter and establish care at [facility].  After discussion with him and his family at our last visit, he decided to proceed with hospice care.  Currently he remains on dex 22m bid. He has no movement in his right arm and has limited mobility with is legs. He has equipment at home and continues current medications.  He continues to have difficulty with sleep and began trazodone per  hospice nurses.     PAST MEDICAL HISTORY:  Past Medical History:   Diagnosis Date    Basal cell carcinoma (BCC) in situ of skin     Constipation     Hypertension     Seizures (CMS code)        PAST SURGICAL HISTORY:  History reviewed. No pertinent surgical history.    FAMILY HISTORY:  Family History   Problem Relation Name Age of Onset    Brain cancer Neg Hx         SOCIAL HISTORY:  Social History     Socioeconomic History    Marital status: Married     Spouse name: Not on file    Number of children: Not on file    Years of education: Not on file    Highest education level: Not on file   Tobacco Use    Smoking status: Never Smoker    Smokeless tobacco: Never Used   Substance and Sexual Activity    Alcohol use: Not Currently    Drug use: Not on file    Sexual activity: Not on file   Other Topics Concern    Not on file   Social History Narrative    Not on file     Occupation   No occupation listed    MEDICATIONS:  No outpatient medications have been marked as taking for the 06/29/19 encounter (Video Visit) with MMady Haagensen MD.       ALLERGIES:  Allergies  as of 06/29/2019    (No Known Allergies)       REVIEW OF SYSTEMS: 14 point review of systems completed.  Over the last 30 days, the patient attests to pertinent positives and negative listed in HPI.  Otherwise all others negative.    PHYSICAL EXAMINATION:   AOx3, fluent speech, intact comprehension.  1/5 movement in right arm and 2/5 movement in right leg otherwise at least antigravity in left.  Unable to walk.     IMAGING: I personally reviewed the most recent MRI and compared to prior.   MRI brain 06/08/19  FINDINGS:    Centered in the left posterior frontal lobe is a peripherally enhancing mass measuring 6.1 x 3.8 x 4.8 cm (AP by TV by CC) with internal susceptibility and peripherally elevated cerebral blood flow. No restricted diffusion. The mass is increased in size from 05/05/2019 when it measured 4.5 x 3.2 x 4.0 cm. The mass also extends to the  motor cortex at its superior margin and abuts the falx and superior sagittal sinus.    Infiltrative FLAIR hyperintensity extends from the mass across the posterior body of the corpus callosum. Cortical thickening and FLAIR hyperintensity of the left posterior cingulate and left precuneus which may represent adjacent site of infiltrative disease.    Mild associated edema at the periphery of the lesion. Local mass effect with sulcal effacement and effacement of the posterior body of the left lateral ventricle. No downward herniation.    No acute intracranial hemorrhage, acute infarct, or hydrocephalus. Postbiopsy changes of the calvarium.    Nonspecific punctate T2/FLAIR hyperintensities in the deep and periventricular white matter, nonspecific.    IMPRESSION:     Compared with 05/05/2019, increased size of centrally necrotic mass centered in the left posterior frontal lobe with infiltrative FLAIR which extends across the posterior body of the corpus callosum, compatible with biopsied glioblastoma. Cortical thickening and FLAIR hyperintensity of the left posterior cingulate and left precuneus consistent with an adjacent site of infiltrative disease.    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 Declined tumor directed therapies and currently on hospice.     I had an extensive discussion with the patient regarding his current symptoms.  We discussed role of steroids in symptom management and at this time we will plan to cont 75m bid unless he has worsening weakness. At that time a trial of a higher dose would be reasonable. We also discussed sleep hygiene and the impact of steroids on sleep. Will continue trazodone  as it was recently started and can increase if needed.  Has equipment at home to help with safety.  Patient will continue with hospice care and will reach out for future appointments or recommendations on management of symptoms.     Follow-up:  - As needed.  - All questions of Patrick Goodman his family were answered to the best of my ability. They have our contact information and will call with further questions.     Orders Placed This Encounter    lacosamide (VIMPAT) 50 mg tablet     I spent a  total of 45 minutes in face-to-face time with the patient and in non-face-to-face activities conducted today 06/29/2019 directly related to this video visit, including reviewing records and tests, obtaining history and exam, placing orders, communicating with other healthcare professionals, counseling the patient, family or caregiver, documenting in the medical record, and/or care coordination for the diagnoses above.    I performed this consultation using real-time Telehealth tools, including a live video connection between my location and the patient's location. Prior to initiating the consultation, I obtained informed verbal consent to perform this consultation using Telehealth tools and answered all the questions about the Telehealth interaction.

## 2019-06-30 DIAGNOSIS — C711 Malignant neoplasm of frontal lobe: Secondary | ICD-10-CM | POA: Diagnosis not present

## 2019-07-01 DIAGNOSIS — C711 Malignant neoplasm of frontal lobe: Secondary | ICD-10-CM | POA: Diagnosis not present

## 2019-07-02 DIAGNOSIS — C711 Malignant neoplasm of frontal lobe: Secondary | ICD-10-CM | POA: Diagnosis not present

## 2019-07-03 DIAGNOSIS — C711 Malignant neoplasm of frontal lobe: Secondary | ICD-10-CM | POA: Diagnosis not present

## 2019-07-04 ENCOUNTER — Ambulatory Visit
Admit: 2019-07-04 | Discharge: 2019-07-04 | Payer: MEDICARE | Attending: Student in an Organized Health Care Education/Training Program

## 2019-07-04 DIAGNOSIS — C711 Malignant neoplasm of frontal lobe: Secondary | ICD-10-CM | POA: Diagnosis not present

## 2019-07-04 DIAGNOSIS — Z029 Encounter for administrative examinations, unspecified: Secondary | ICD-10-CM

## 2019-07-04 NOTE — Progress Notes (Signed)
error  This encounter was created in error - please disregard.

## 2019-07-05 DIAGNOSIS — C711 Malignant neoplasm of frontal lobe: Secondary | ICD-10-CM | POA: Diagnosis not present

## 2019-07-07 DIAGNOSIS — C711 Malignant neoplasm of frontal lobe: Secondary | ICD-10-CM | POA: Diagnosis not present

## 2019-07-07 MED ORDER — MIRTAZAPINE 7.5 MG TABLET
7.5 | ORAL_TABLET | Freq: Every day | ORAL | 0 refills | Status: AC
Start: 2019-07-07 — End: 2019-07-22

## 2019-07-07 MED ORDER — MELATONIN 1 MG TABLET
1 | ORAL_TABLET | Freq: Every day | ORAL | 1 refills | Status: AC
Start: 2019-07-07 — End: 2019-08-06

## 2019-07-07 NOTE — Addendum Note (Signed)
Addended by: Mady Haagensen on: 07/07/2019 12:04 PM     Modules accepted: Orders

## 2019-07-08 NOTE — Progress Notes (Signed)
[facility] Caregiver [facility]  GBM Outreach Call - 2 Week    Coordinator contacted patient's caregiver, Lenna Sciara, for GBM outreach call. Reached voicemail and left message inviting them to contact Caregiver Program as needed. Will follow up at 4 weeks, per GBM outreach protocol.    Heidi Dach  Ext. [phone]

## 2019-07-12 DIAGNOSIS — C711 Malignant neoplasm of frontal lobe: Secondary | ICD-10-CM | POA: Diagnosis not present

## 2019-07-13 DIAGNOSIS — C711 Malignant neoplasm of frontal lobe: Secondary | ICD-10-CM | POA: Diagnosis not present

## 2019-07-14 DIAGNOSIS — C711 Malignant neoplasm of frontal lobe: Secondary | ICD-10-CM | POA: Diagnosis not present

## 2019-07-15 DIAGNOSIS — C711 Malignant neoplasm of frontal lobe: Secondary | ICD-10-CM | POA: Diagnosis not present

## 2019-07-15 NOTE — Progress Notes (Signed)
This CG RN reached out to pt's caregiver daughter, Patrick Goodman to follow up on transition to hospice. Per Patrick Goodman is getting weaker, but still has a good appetite and likes to go out for ice cream. She acknowledges that this is very difficult, but so far [facility] has been satisfactory in their care. She knows that she may contact us and Dr. Lyndal Pulley with any questions/concerns/support. Denies any additional needs at this time, appreciated cal..

## 2019-07-17 DIAGNOSIS — C711 Malignant neoplasm of frontal lobe: Secondary | ICD-10-CM | POA: Diagnosis not present

## 2019-07-18 DIAGNOSIS — C711 Malignant neoplasm of frontal lobe: Secondary | ICD-10-CM | POA: Diagnosis not present

## 2019-07-20 DIAGNOSIS — C711 Malignant neoplasm of frontal lobe: Secondary | ICD-10-CM | POA: Diagnosis not present

## 2019-07-21 MED ORDER — DEXAMETHASONE 2 MG TABLET
2 | ORAL_TABLET | ORAL | 1 refills | Status: AC
Start: 2019-07-21 — End: ?

## 2019-07-21 MED ORDER — LACOSAMIDE 100 MG TABLET
100 | ORAL_TABLET | Freq: Two times a day (BID) | ORAL | 11 refills | Status: AC
Start: 2019-07-21 — End: ?

## 2019-07-21 NOTE — Telephone Encounter (Signed)
Patrick Goodman called and reported that Patrick Goodman is noticeably weaker after eating especially after breakfast. She also said that they have noticed new shaking or tremors on the left hand that lasted less than 5 seconds.   She also wanted to ask what the ideal timing is between the patient's lorazepam and steroid since the steroid seems to cancel out the sedative effect of lorazepam.  She asked if Patrick Goodman takes the steroid at 9am and 3pm when are the windows to take lorazepam?  She said that the pt was started on Lorazepam 1mg  by hospice for his anxiety/ rest and usually takes it around 2-5pm.

## 2019-07-22 DIAGNOSIS — C711 Malignant neoplasm of frontal lobe: Secondary | ICD-10-CM | POA: Diagnosis not present

## 2019-07-25 DIAGNOSIS — C711 Malignant neoplasm of frontal lobe: Secondary | ICD-10-CM | POA: Diagnosis not present

## 2019-07-27 DIAGNOSIS — C711 Malignant neoplasm of frontal lobe: Secondary | ICD-10-CM | POA: Diagnosis not present

## 2019-07-28 DIAGNOSIS — C711 Malignant neoplasm of frontal lobe: Secondary | ICD-10-CM | POA: Diagnosis not present

## 2019-07-29 DIAGNOSIS — C711 Malignant neoplasm of frontal lobe: Secondary | ICD-10-CM | POA: Diagnosis not present

## 2019-08-01 DIAGNOSIS — C711 Malignant neoplasm of frontal lobe: Secondary | ICD-10-CM | POA: Diagnosis not present

## 2019-08-02 DIAGNOSIS — C711 Malignant neoplasm of frontal lobe: Secondary | ICD-10-CM | POA: Diagnosis not present

## 2019-08-03 DIAGNOSIS — C711 Malignant neoplasm of frontal lobe: Secondary | ICD-10-CM | POA: Diagnosis not present

## 2019-08-04 DIAGNOSIS — C711 Malignant neoplasm of frontal lobe: Secondary | ICD-10-CM | POA: Diagnosis not present

## 2019-08-06 DIAGNOSIS — C711 Malignant neoplasm of frontal lobe: Secondary | ICD-10-CM | POA: Diagnosis not present

## 2019-08-07 DIAGNOSIS — C711 Malignant neoplasm of frontal lobe: Secondary | ICD-10-CM | POA: Diagnosis not present

## 2019-08-08 DIAGNOSIS — K5900 Constipation, unspecified: Secondary | ICD-10-CM | POA: Diagnosis not present

## 2019-08-08 DIAGNOSIS — C711 Malignant neoplasm of frontal lobe: Secondary | ICD-10-CM | POA: Diagnosis not present

## 2020-07-25 IMAGING — MR MRI HEAD WITHOUT AND WITH CONTRAST
10 of 13 series · 33 of 48 positions shown · IV contrast (Yes)
Comparison: MRI head 03/19/2019

CLINICAL DATA: Brain tumor.  Worsening right-sided weakness

EXAM:
MRI HEAD WITHOUT AND WITH CONTRAST
TECHNIQUE: Multiplanar, multiecho pulse sequences of the brain and surrounding
structures were obtained without and with intravenous contrast.
CONTRAST:  10 mL Gadovist IV

[Series 4: DWI · axial · 3.0mm · 1.09mm/px · z∈[-44,+102]mm · 6 of 100 slices shown (1 of 4)]
[im 1/100]
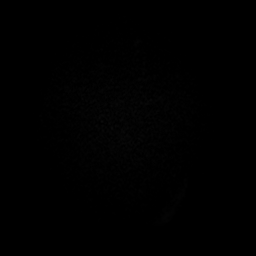
[im 20/100]
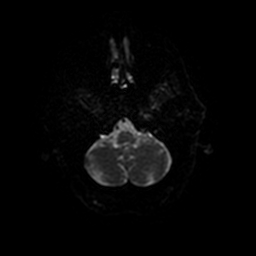
[im 40/100]
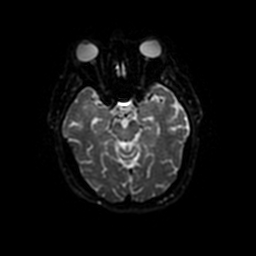
[im 60/100]
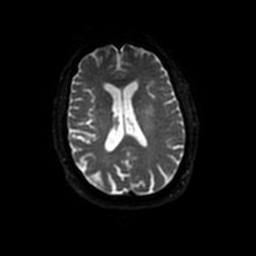
[im 80/100]
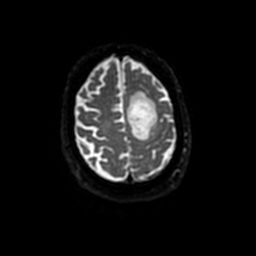
[im 100/100]
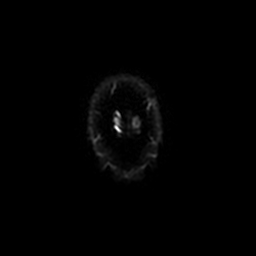

[Series 5: T2 · axial · 5.0mm · 0.90mm/px · 1 of 22 slices shown]
[im 1/22]
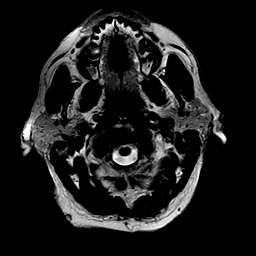

[Series 6: FLAIR · axial · 3.0mm · 0.45mm/px · z∈[-45,+104]mm · 2 of 26 slices shown]
[im 1/26]
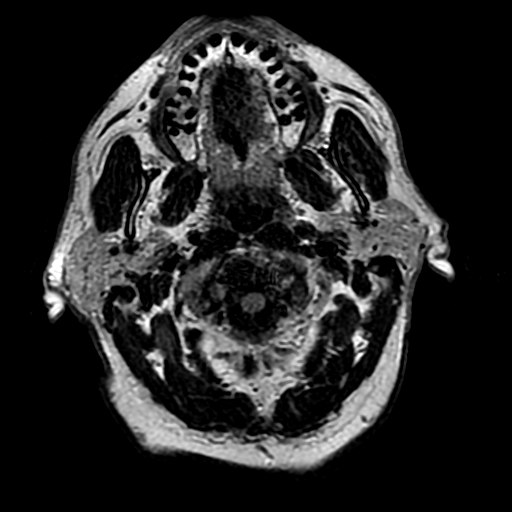
[im 26/26]
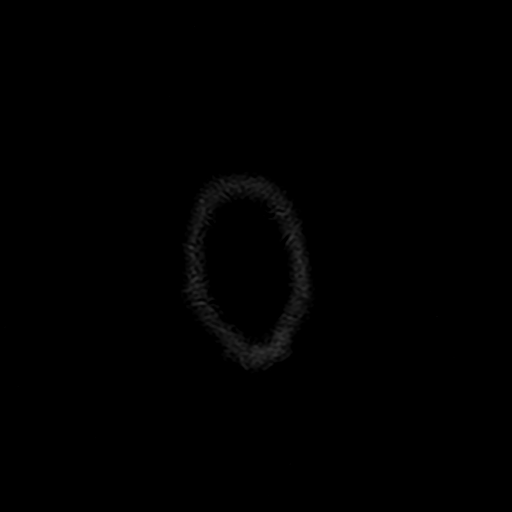

[Series 9: DWI · coronal · 4.0mm · 1.09mm/px · 5 of 84 slices shown (2 of 4)]
[im 1/84]
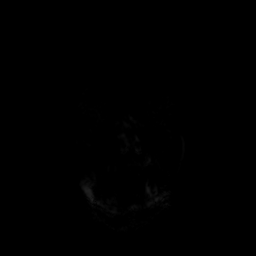
[im 21/84]
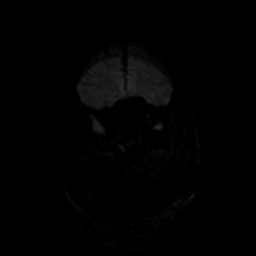
[im 42/84]
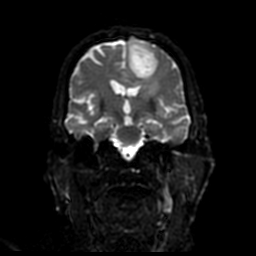
[im 63/84]
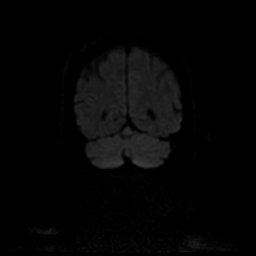
[im 84/84]
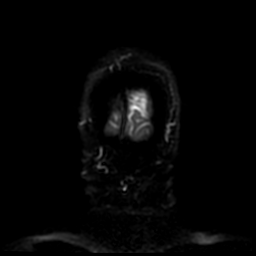

[Series 10: T2 post-contrast · coronal · 5.0mm · 0.90mm/px · 2 of 27 slices shown]
[im 1/27]
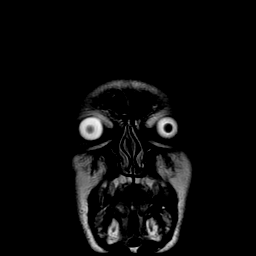
[im 27/27]
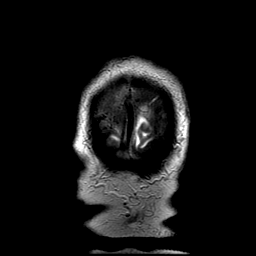

[Series 11: T1 post-contrast · axial · 1.0mm · 0.47mm/px · z∈[-44,+103]mm · 8 of 150 slices shown (1 of 3)]
[im 1/150]
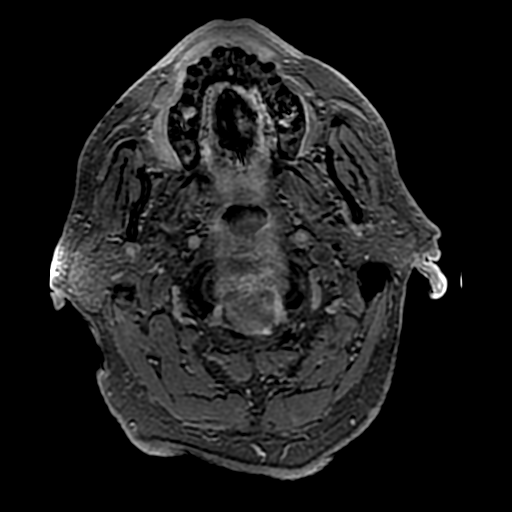
[im 17/150]
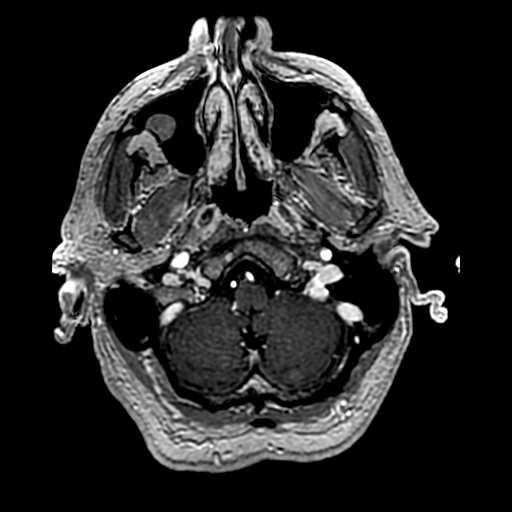
[im 50/150]
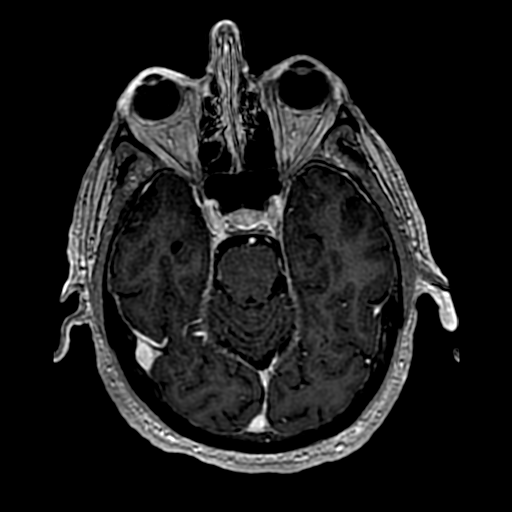
[im 67/150]
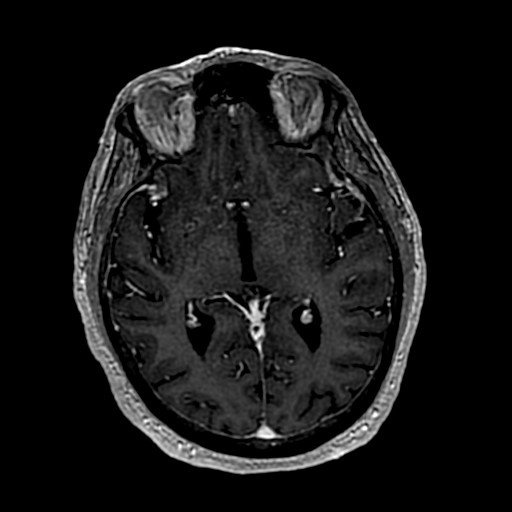
[im 83/150]
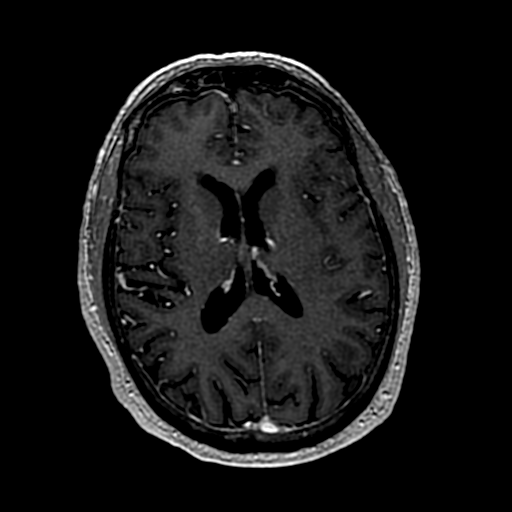
[im 100/150]
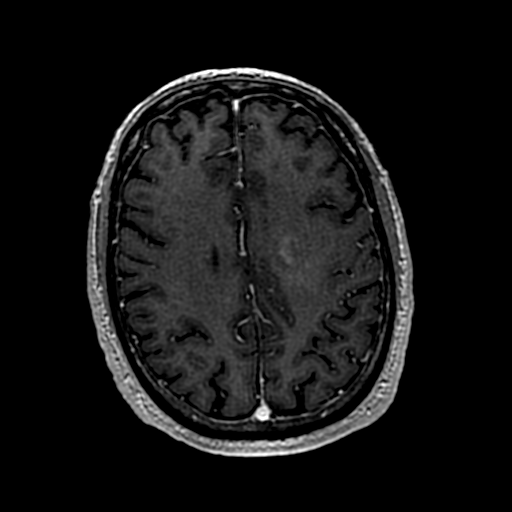
[im 133/150]
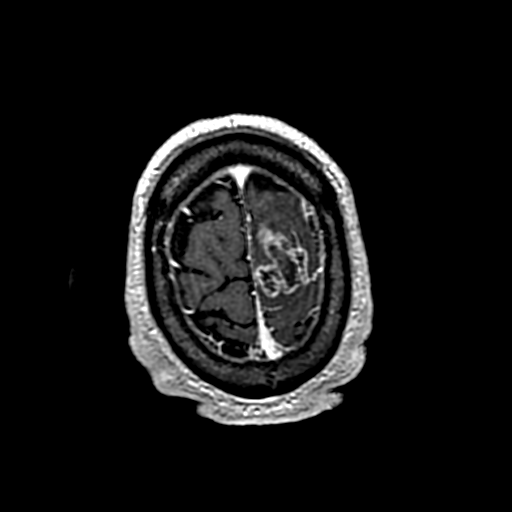
[im 150/150]
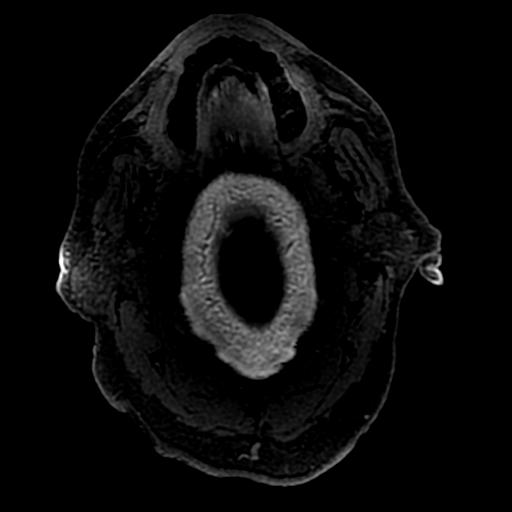

[Series 12: T1 post-contrast · coronal · 5.0mm · 0.45mm/px · 2 of 27 slices shown (2 of 3)]
[im 1/27]
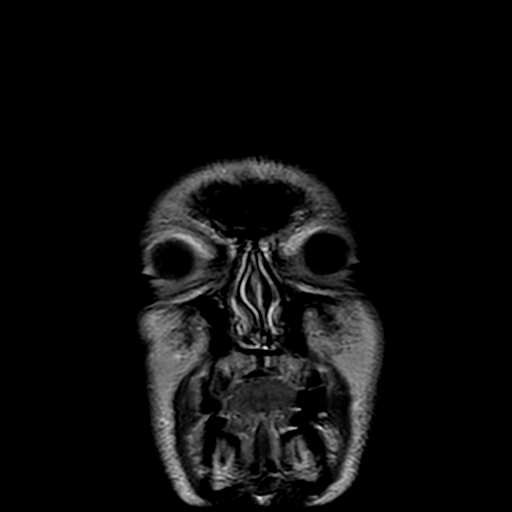
[im 27/27]
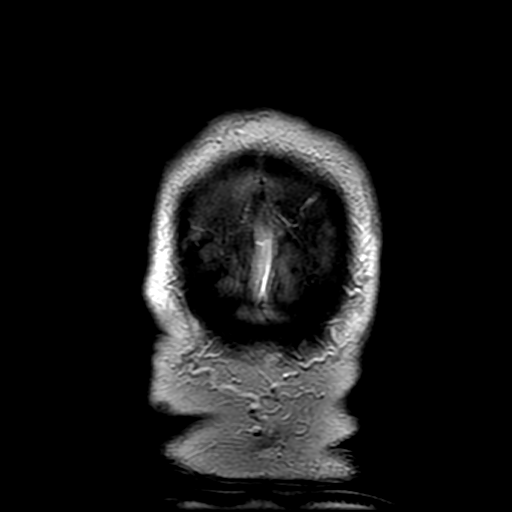

[Series 13: T1 post-contrast · sagittal · 5.0mm · 0.47mm/px · 1 of 20 slices shown (3 of 3)]
[im 1/20]
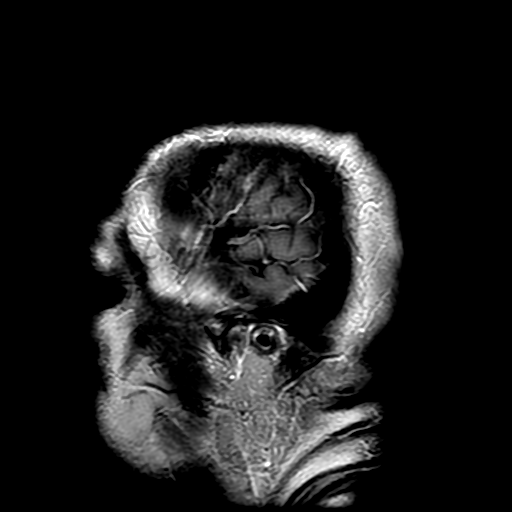

[Series 400: DWI · axial · 3.0mm · 1.09mm/px · z∈[-44,+102]mm · 3 of 50 slices shown (3 of 4)]
[im 1/50]
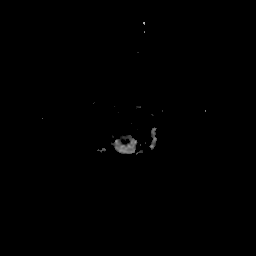
[im 25/50]
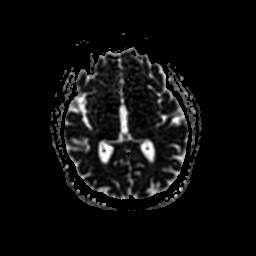
[im 50/50]
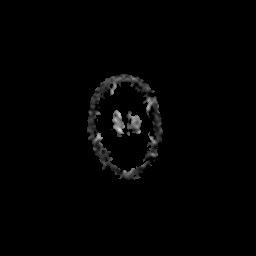

[Series 900: DWI · coronal · 4.0mm · 1.09mm/px · 3 of 42 slices shown (4 of 4)]
[im 1/42]
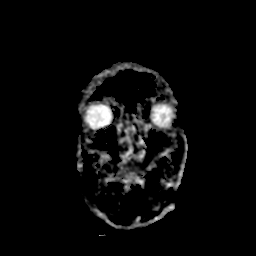
[im 21/42]
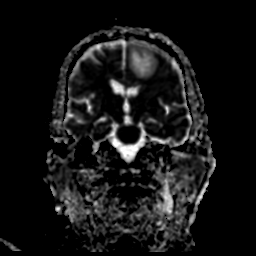
[im 42/42]
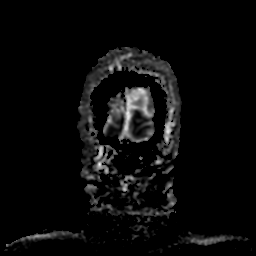

[33 of 48 positions shown; findings below may reference images not displayed]

FINDINGS: Brain: Rapidly enlarging mass in the left posterior frontal lobe.
Marked progression of enhancement of the mass. The mass now shows
extensive peripheral enhancement with central necrosis and measures
44 x 32 x 39 mm. Previously, there was only a small area of
enhancement over the convexity which appeared to be on the surface
of the brain. Edema surrounding the enhancing component of the mass
has progressed. Local mass-effect on the left lateral ventricle
without significant midline shift. The lesion does not show
restricted diffusion. Interval development of small amount of
hemorrhage within the lesion. No acute infarct. No other lesions
identified.

Vascular: Normal arterial flow voids

Skull and upper cervical spine: Negative

Sinuses/Orbits: Mild mucosal edema paranasal sinuses.  Normal orbit

Other: None
IMPRESSION: Rapid interval growth of left posterior frontal mass lesion. The
lesion now shows extensive enhancement with central necrosis and a
small amount of internal hemorrhage. Increased surrounding
mass-effect and edema. The appearance is most consistent with
glioblastoma. Rapidly growing oligometastatic disease considered
less likely.
